# Patient Record
Sex: Female | Born: 1945 | Race: Black or African American | Hispanic: No | Smoking: Former smoker
Health system: Southern US, Community
[De-identification: ages and names within clinical notes are randomized; demographics above are authoritative.]

## PROBLEM LIST (undated history)

## (undated) DIAGNOSIS — T7840XA Allergy, unspecified, initial encounter: Secondary | ICD-10-CM

## (undated) DIAGNOSIS — K219 Gastro-esophageal reflux disease without esophagitis: Secondary | ICD-10-CM

## (undated) DIAGNOSIS — R918 Other nonspecific abnormal finding of lung field: Secondary | ICD-10-CM

## (undated) DIAGNOSIS — D869 Sarcoidosis, unspecified: Secondary | ICD-10-CM

## (undated) DIAGNOSIS — M1990 Unspecified osteoarthritis, unspecified site: Secondary | ICD-10-CM

## (undated) DIAGNOSIS — I10 Essential (primary) hypertension: Secondary | ICD-10-CM

## (undated) DIAGNOSIS — M479 Spondylosis, unspecified: Secondary | ICD-10-CM

## (undated) DIAGNOSIS — C3490 Malignant neoplasm of unspecified part of unspecified bronchus or lung: Secondary | ICD-10-CM

## (undated) DIAGNOSIS — J449 Chronic obstructive pulmonary disease, unspecified: Secondary | ICD-10-CM

## (undated) HISTORY — DX: Gastro-esophageal reflux disease without esophagitis: K21.9

## (undated) HISTORY — DX: Other nonspecific abnormal finding of lung field: R91.8

## (undated) HISTORY — DX: Essential (primary) hypertension: I10

## (undated) HISTORY — PX: ABDOMINAL HYSTERECTOMY: SHX81

## (undated) HISTORY — DX: Chronic obstructive pulmonary disease, unspecified: J44.9

## (undated) HISTORY — DX: Unspecified osteoarthritis, unspecified site: M19.90

---

## 1998-08-10 ENCOUNTER — Emergency Department (HOSPITAL_COMMUNITY): Admission: EM | Admit: 1998-08-10 | Discharge: 1998-08-10 | Payer: Self-pay | Admitting: Emergency Medicine

## 1998-08-10 ENCOUNTER — Encounter: Payer: Self-pay | Admitting: Emergency Medicine

## 2000-05-25 ENCOUNTER — Emergency Department (HOSPITAL_COMMUNITY): Admission: EM | Admit: 2000-05-25 | Discharge: 2000-05-25 | Payer: Self-pay | Admitting: Emergency Medicine

## 2000-05-26 ENCOUNTER — Emergency Department (HOSPITAL_COMMUNITY): Admission: EM | Admit: 2000-05-26 | Discharge: 2000-05-26 | Payer: Self-pay | Admitting: *Deleted

## 2000-06-05 ENCOUNTER — Emergency Department (HOSPITAL_COMMUNITY): Admission: EM | Admit: 2000-06-05 | Discharge: 2000-06-05 | Payer: Self-pay | Admitting: Emergency Medicine

## 2000-06-18 ENCOUNTER — Encounter: Admission: RE | Admit: 2000-06-18 | Discharge: 2000-07-25 | Payer: Self-pay | Admitting: Specialist

## 2001-05-14 ENCOUNTER — Ambulatory Visit (HOSPITAL_COMMUNITY): Admission: RE | Admit: 2001-05-14 | Discharge: 2001-05-14 | Payer: Self-pay | Admitting: Family Medicine

## 2001-05-14 ENCOUNTER — Encounter: Payer: Self-pay | Admitting: Family Medicine

## 2001-10-27 ENCOUNTER — Ambulatory Visit (HOSPITAL_COMMUNITY): Admission: RE | Admit: 2001-10-27 | Discharge: 2001-10-27 | Payer: Self-pay | Admitting: Family Medicine

## 2002-08-01 ENCOUNTER — Ambulatory Visit (HOSPITAL_COMMUNITY): Admission: RE | Admit: 2002-08-01 | Discharge: 2002-08-01 | Payer: Self-pay | Admitting: Family Medicine

## 2002-08-01 ENCOUNTER — Encounter: Payer: Self-pay | Admitting: Family Medicine

## 2002-11-14 ENCOUNTER — Ambulatory Visit (HOSPITAL_COMMUNITY): Admission: RE | Admit: 2002-11-14 | Discharge: 2002-11-14 | Payer: Self-pay | Admitting: Family Medicine

## 2002-11-14 ENCOUNTER — Encounter: Payer: Self-pay | Admitting: Family Medicine

## 2004-01-03 ENCOUNTER — Ambulatory Visit: Payer: Self-pay | Admitting: *Deleted

## 2004-03-16 ENCOUNTER — Ambulatory Visit: Payer: Self-pay | Admitting: Family Medicine

## 2005-01-04 ENCOUNTER — Ambulatory Visit: Payer: Self-pay | Admitting: Family Medicine

## 2005-02-01 ENCOUNTER — Ambulatory Visit: Payer: Self-pay | Admitting: Family Medicine

## 2005-11-19 ENCOUNTER — Ambulatory Visit: Payer: Self-pay | Admitting: Family Medicine

## 2005-12-12 ENCOUNTER — Ambulatory Visit (HOSPITAL_COMMUNITY): Admission: RE | Admit: 2005-12-12 | Discharge: 2005-12-12 | Payer: Self-pay | Admitting: Family Medicine

## 2006-05-31 ENCOUNTER — Ambulatory Visit: Payer: Self-pay | Admitting: Family Medicine

## 2006-06-20 ENCOUNTER — Ambulatory Visit (HOSPITAL_COMMUNITY): Admission: RE | Admit: 2006-06-20 | Discharge: 2006-06-20 | Payer: Self-pay | Admitting: Family Medicine

## 2006-12-11 ENCOUNTER — Encounter (INDEPENDENT_AMBULATORY_CARE_PROVIDER_SITE_OTHER): Payer: Self-pay | Admitting: *Deleted

## 2007-09-07 ENCOUNTER — Emergency Department (HOSPITAL_COMMUNITY): Admission: EM | Admit: 2007-09-07 | Discharge: 2007-09-08 | Payer: Self-pay | Admitting: Emergency Medicine

## 2008-02-03 ENCOUNTER — Encounter: Admission: RE | Admit: 2008-02-03 | Discharge: 2008-02-03 | Payer: Self-pay | Admitting: Nephrology

## 2008-03-26 LAB — HM MAMMOGRAPHY: HM Mammogram: NORMAL

## 2009-06-10 ENCOUNTER — Ambulatory Visit: Payer: Self-pay | Admitting: Internal Medicine

## 2009-06-10 DIAGNOSIS — M1990 Unspecified osteoarthritis, unspecified site: Secondary | ICD-10-CM | POA: Insufficient documentation

## 2009-06-10 DIAGNOSIS — I10 Essential (primary) hypertension: Secondary | ICD-10-CM | POA: Insufficient documentation

## 2009-06-10 DIAGNOSIS — J449 Chronic obstructive pulmonary disease, unspecified: Secondary | ICD-10-CM

## 2009-06-10 DIAGNOSIS — K219 Gastro-esophageal reflux disease without esophagitis: Secondary | ICD-10-CM

## 2009-06-13 ENCOUNTER — Encounter: Payer: Self-pay | Admitting: Internal Medicine

## 2009-06-21 ENCOUNTER — Ambulatory Visit: Payer: Self-pay | Admitting: Internal Medicine

## 2009-06-23 ENCOUNTER — Encounter: Payer: Self-pay | Admitting: Internal Medicine

## 2009-06-24 ENCOUNTER — Ambulatory Visit: Payer: Self-pay | Admitting: Internal Medicine

## 2009-06-24 DIAGNOSIS — R740 Nonspecific elevation of levels of transaminase and lactic acid dehydrogenase [LDH]: Secondary | ICD-10-CM

## 2009-06-24 LAB — CONVERTED CEMR LAB
ALT: 34 units/L (ref 0–35)
Alpha-2-Globulin: 8.1 % (ref 7.1–11.8)
Beta Globulin: 6.8 % (ref 4.7–7.2)
Bilirubin, Direct: 0.1 mg/dL (ref 0.0–0.3)
Gamma Globulin: 25.9 % — ABNORMAL HIGH (ref 11.1–18.8)
Hepatitis B Surface Ag: NEGATIVE
INR: 1 (ref 0.8–1.0)
Prothrombin Time: 10.6 s (ref 9.1–11.7)
Total Protein, Serum Electrophoresis: 9.3 g/dL — ABNORMAL HIGH (ref 6.0–8.3)
Total Protein: 8.8 g/dL — ABNORMAL HIGH (ref 6.0–8.3)

## 2009-06-29 ENCOUNTER — Telehealth: Payer: Self-pay | Admitting: Internal Medicine

## 2009-06-29 ENCOUNTER — Encounter: Payer: Self-pay | Admitting: Internal Medicine

## 2009-06-29 DIAGNOSIS — D472 Monoclonal gammopathy: Secondary | ICD-10-CM

## 2009-07-04 ENCOUNTER — Telehealth: Payer: Self-pay | Admitting: Internal Medicine

## 2009-07-04 ENCOUNTER — Ambulatory Visit: Payer: Self-pay | Admitting: Internal Medicine

## 2009-07-04 LAB — CONVERTED CEMR LAB
Albumin ELP: 46.5 % — ABNORMAL LOW (ref 55.8–66.1)
Total Protein, Serum Electrophoresis: 9 g/dL — ABNORMAL HIGH (ref 6.0–8.3)

## 2009-07-29 ENCOUNTER — Ambulatory Visit: Payer: Self-pay | Admitting: Internal Medicine

## 2009-08-09 ENCOUNTER — Encounter (INDEPENDENT_AMBULATORY_CARE_PROVIDER_SITE_OTHER): Payer: Self-pay | Admitting: *Deleted

## 2009-09-02 ENCOUNTER — Ambulatory Visit: Payer: Self-pay | Admitting: Internal Medicine

## 2009-10-14 ENCOUNTER — Ambulatory Visit: Payer: Self-pay | Admitting: Internal Medicine

## 2009-10-18 ENCOUNTER — Telehealth (INDEPENDENT_AMBULATORY_CARE_PROVIDER_SITE_OTHER): Payer: Self-pay | Admitting: *Deleted

## 2009-11-16 ENCOUNTER — Telehealth: Payer: Self-pay | Admitting: Internal Medicine

## 2009-12-16 ENCOUNTER — Telehealth: Payer: Self-pay | Admitting: Internal Medicine

## 2009-12-23 ENCOUNTER — Ambulatory Visit: Payer: Self-pay | Admitting: Internal Medicine

## 2009-12-29 ENCOUNTER — Telehealth: Payer: Self-pay | Admitting: Internal Medicine

## 2010-01-27 ENCOUNTER — Ambulatory Visit: Payer: Self-pay | Admitting: Internal Medicine

## 2010-03-05 ENCOUNTER — Emergency Department (HOSPITAL_COMMUNITY)
Admission: EM | Admit: 2010-03-05 | Discharge: 2010-03-05 | Payer: Self-pay | Source: Home / Self Care | Admitting: Emergency Medicine

## 2010-03-08 ENCOUNTER — Ambulatory Visit: Payer: Self-pay | Admitting: Internal Medicine

## 2010-03-08 ENCOUNTER — Encounter: Payer: Self-pay | Admitting: Internal Medicine

## 2010-03-08 DIAGNOSIS — M545 Low back pain: Secondary | ICD-10-CM

## 2010-03-21 ENCOUNTER — Ambulatory Visit: Payer: Self-pay | Admitting: Internal Medicine

## 2010-04-13 ENCOUNTER — Telehealth: Payer: Self-pay | Admitting: Internal Medicine

## 2010-04-14 ENCOUNTER — Ambulatory Visit
Admission: RE | Admit: 2010-04-14 | Discharge: 2010-04-14 | Payer: Self-pay | Source: Home / Self Care | Attending: Internal Medicine | Admitting: Internal Medicine

## 2010-04-14 DIAGNOSIS — M545 Low back pain: Secondary | ICD-10-CM

## 2010-04-14 DIAGNOSIS — G8929 Other chronic pain: Secondary | ICD-10-CM | POA: Insufficient documentation

## 2010-04-14 DIAGNOSIS — J3089 Other allergic rhinitis: Secondary | ICD-10-CM | POA: Insufficient documentation

## 2010-04-19 ENCOUNTER — Telehealth: Payer: Self-pay | Admitting: Internal Medicine

## 2010-04-19 ENCOUNTER — Encounter: Payer: Self-pay | Admitting: Internal Medicine

## 2010-04-19 ENCOUNTER — Ambulatory Visit (HOSPITAL_COMMUNITY)
Admission: RE | Admit: 2010-04-19 | Discharge: 2010-04-19 | Payer: Self-pay | Source: Home / Self Care | Attending: Internal Medicine | Admitting: Internal Medicine

## 2010-04-19 DIAGNOSIS — R109 Unspecified abdominal pain: Secondary | ICD-10-CM | POA: Insufficient documentation

## 2010-04-21 ENCOUNTER — Ambulatory Visit: Admit: 2010-04-21 | Payer: Self-pay | Admitting: Internal Medicine

## 2010-04-21 ENCOUNTER — Other Ambulatory Visit: Payer: Self-pay | Admitting: Internal Medicine

## 2010-04-21 DIAGNOSIS — R102 Pelvic and perineal pain: Secondary | ICD-10-CM

## 2010-04-23 LAB — CONVERTED CEMR LAB
ALT: 42 units/L — ABNORMAL HIGH (ref 0–35)
AST: 39 units/L — ABNORMAL HIGH (ref 0–37)
Albumin: 3.7 g/dL (ref 3.5–5.2)
BUN: 8 mg/dL (ref 6–23)
Basophils Absolute: 0.1 10*3/uL (ref 0.0–0.1)
CO2: 29 meq/L (ref 19–32)
Chloride: 105 meq/L (ref 96–112)
Direct LDL: 144.9 mg/dL
Eosinophils Relative: 15.5 % — ABNORMAL HIGH (ref 0.0–5.0)
Glucose, Bld: 94 mg/dL (ref 70–99)
HCT: 39 % (ref 36.0–46.0)
Hemoglobin: 12.7 g/dL (ref 12.0–15.0)
Lymphs Abs: 0.9 10*3/uL (ref 0.7–4.0)
MCV: 92.1 fL (ref 78.0–100.0)
Monocytes Absolute: 0.7 10*3/uL (ref 0.1–1.0)
Monocytes Relative: 11.9 % (ref 3.0–12.0)
Neutro Abs: 2.9 10*3/uL (ref 1.4–7.7)
Platelets: 233 10*3/uL (ref 150.0–400.0)
Potassium: 3.7 meq/L (ref 3.5–5.1)
RDW: 13.2 % (ref 11.5–14.6)
Sodium: 144 meq/L (ref 135–145)
TSH: 1.12 microintl units/mL (ref 0.35–5.50)
Total Bilirubin: 0.6 mg/dL (ref 0.3–1.2)

## 2010-04-25 NOTE — Assessment & Plan Note (Signed)
Summary: 2 WK ROV /NWS #   Vital Signs:  Patient profile:   65 year old female Height:      62 inches Weight:      155 pounds BMI:     28.45 O2 Sat:      96 % on Room air Temp:     99.0 degrees F oral Pulse rate:   100 / minute Pulse rhythm:   regular Resp:     16 per minute BP sitting:   130 / 88  (left arm) Cuff size:   regular  Vitals Entered By: Rock Nephew CMA (June 24, 2009 10:02 AM)  Nutrition Counseling: Patient's BMI is greater than 25 and therefore counseled on weight management options.  O2 Flow:  Room air CC: follow-up visit// discuss lab test results, Hypertension Management Is Patient Diabetic? Yes Did you bring your meter with you today? No   Primary Care Provider:  Etta Grandchild MD  CC:  follow-up visit// discuss lab test results and Hypertension Management.  History of Present Illness: She returns for f/up  and she states that her breathing is much better. Her PFT's were c/w COPD with a good response to bronchodilator. She reports that her endurance is much better.  On her recent labs she had elevated lft's, total protein, and alk phos.  She complains of a recurrence of right shoulder pain. Her previous PCP did an xray and told her that it was arthritis.  Hypertension History:      She denies headache, chest pain, palpitations, dyspnea with exertion, orthopnea, PND, peripheral edema, visual symptoms, neurologic problems, syncope, and side effects from treatment.  She notes no problems with any antihypertensive medication side effects.        Positive major cardiovascular risk factors include female age 87 years old or older and hypertension.  Negative major cardiovascular risk factors include no history of diabetes or hyperlipidemia, negative family history for ischemic heart disease, and non-tobacco-user status.        Further assessment for target organ damage reveals no history of ASHD, cardiac end-organ damage (CHF/LVH), stroke/TIA, peripheral  vascular disease, renal insufficiency, or hypertensive retinopathy.     Preventive Screening-Counseling & Management  Alcohol-Tobacco     Alcohol drinks/day: 1     Alcohol type: beer     >5/day in last 3 mos: no     Alcohol Counseling: not indicated; use of alcohol is not excessive or problematic     Feels need to cut down: no     Feels annoyed by complaints: no     Feels guilty re: drinking: no     Needs 'eye opener' in am: no     Smoking Status: quit     Smoking Cessation Counseling: no     Smoke Cessation Stage: quit     Packs/Day: 2.0     Year Started: 1965     Year Quit: 2001     Pack years: 64     Tobacco Counseling: to remain off tobacco products  Hep-HIV-STD-Contraception     Hepatitis Risk: no risk noted     HIV Risk: no risk noted     STD Risk: no risk noted  Safety-Violence-Falls     Seat Belt Use: yes     Helmet Use: yes     Firearms in the Home: no firearms in the home     Smoke Detectors: yes     Violence in the Home: no risk noted  Sexual Abuse: no      Sexual History:  currently monogamous.        Drug Use:  no.        Blood Transfusions:  no.    Clinical Review Panels:  Lipid Management   Cholesterol:  205 (06/10/2009)   HDL (good cholesterol):  42.70 (06/10/2009)  Diabetes Management   Creatinine:  0.9 (06/10/2009)   Last Pneumovax:  Pneumovax (06/10/2009)  CBC   WBC:  5.5 (06/10/2009)   RBC:  4.23 (06/10/2009)   Hgb:  12.7 (06/10/2009)   Hct:  39.0 (06/10/2009)   Platelets:  233.0 (06/10/2009)   MCV  92.1 (06/10/2009)   MCHC  32.6 (06/10/2009)   RDW  13.2 (06/10/2009)   PMN:  55.0 A Manual Differential was performed and is consistent with the Automated Differential. % (06/10/2009)   Lymphs:  16.5 (06/10/2009)   Monos:  11.9 (06/10/2009)   Eosinophils:  15.5 H % (06/10/2009)   Basophil:  1.1 (06/10/2009)  Complete Metabolic Panel   Glucose:  94 (06/10/2009)   Sodium:  144 (06/10/2009)   Potassium:  3.7 (06/10/2009)    Chloride:  105 (06/10/2009)   CO2:  29 (06/10/2009)   BUN:  8 (06/10/2009)   Creatinine:  0.9 (06/10/2009)   Albumin:  3.7 (06/10/2009)   Total Protein:  9.0 (06/10/2009)   Calcium:  9.2 (06/10/2009)   Total Bili:  0.6 (06/10/2009)   Alk Phos:  177 (06/10/2009)   SGPT (ALT):  42 (06/10/2009)   SGOT (AST):  39 (06/10/2009)   Medications Prior to Update: 1)  Ipratropium-Albuterol 0.5-2.5 (3) Mg/63ml Soln (Ipratropium-Albuterol) .Marland Kitchen.. 1 Vial Per Nebulizer Treatment As Directed 2)  Prilosec 20 Mg Cpdr (Omeprazole) .Marland Kitchen.. 1 Tab Once Daily 3)  Metoprolol Succinate 25 Mg Tab (Metoprolol Succinate) .Marland Kitchen.. 1 Tab Two Times A Day 4)  Sulindac 200 Mg Tabs (Sulindac) .Marland Kitchen.. 1 Tab Two Times A Day 5)  Losartan Potassium-Hctz 50-12.5 Mg Tabs (Losartan Potassium-Hctz) .... Once Daily 6)  Ventolin Hfa 108 (90 Base) Mcg/act Aers (Albuterol Sulfate) .... 2 Puffs As Needed 7)  Brovana 15 Mcg/75ml Nebu (Arformoterol Tartrate) .... One in [place] Two Times A Day For Lung Disease  Current Medications (verified): 1)  Ipratropium-Albuterol 0.5-2.5 (3) Mg/51ml Soln (Ipratropium-Albuterol) .Marland Kitchen.. 1 Vial Per Nebulizer Treatment As Directed 2)  Prilosec 20 Mg Cpdr (Omeprazole) .Marland Kitchen.. 1 Tab Once Daily 3)  Metoprolol Succinate 25 Mg Tab (Metoprolol Succinate) .Marland Kitchen.. 1 Tab Two Times A Day 4)  Sulindac 200 Mg Tabs (Sulindac) .Marland Kitchen.. 1 Tab Two Times A Day 5)  Losartan Potassium-Hctz 50-12.5 Mg Tabs (Losartan Potassium-Hctz) .... Once Daily 6)  Ventolin Hfa 108 (90 Base) Mcg/act Aers (Albuterol Sulfate) .... 2 Puffs As Needed 7)  Brovana 15 Mcg/48ml Nebu (Arformoterol Tartrate) .... One in [place] Two Times A Day For Lung Disease 8)  Tramadol Hcl 50 Mg Tabs (Tramadol Hcl) .Marland Kitchen.. 1-2 By Mouth Qid As Needed For Pain  Allergies (verified): 1)  ! Ibuprofen  Past History:  Past Medical History: Reviewed history from 06/10/2009 and no changes required. COPD GERD Hypertension Osteoarthritis  Past Surgical History: Reviewed history from  06/10/2009 and no changes required. Hysterectomy  Family History: Reviewed history from 06/10/2009 and no changes required. Family History Hypertension  Social History: Reviewed history from 06/10/2009 and no changes required. Occupation: school bus Acupuncturist Alcohol use-yes Drug use-no Regular exercise-no  Review of Systems  The patient denies anorexia, fever, weight loss, weight gain, chest pain, peripheral edema, prolonged cough, headaches, hemoptysis, abdominal  pain, hematuria, suspicious skin lesions, enlarged lymph nodes, and angioedema.   Resp:  Denies chest pain with inspiration, cough, coughing up blood, morning headaches, pleuritic, shortness of breath, sputum productive, and wheezing. GI:  Denies abdominal pain, bloody stools, change in bowel habits, diarrhea, indigestion, loss of appetite, nausea, vomiting, and yellowish skin color. MS:  Complains of joint pain and stiffness; denies joint redness, joint swelling, loss of strength, low back pain, mid back pain, muscle aches, and thoracic pain.  Physical Exam  General:  alert, well-nourished, well-hydrated, appropriate dress, normal appearance, healthy-appearing, cooperative to examination, good hygiene, and underweight appearing.   Head:  normocephalic, atraumatic, no abnormalities observed, and no abnormalities palpated.   Eyes:  No icterus, pink moist mm., Mouth:  Oral mucosa and oropharynx without lesions or exudates.  Teeth in good repair. Neck:  No deformities, masses, or tenderness noted. Lungs:  Normal respiratory effort, chest expands symmetrically. Lungs are clear to auscultation, no crackles or wheezes. Heart:  Normal rate and regular rhythm. S1 and S2 normal without gallop, murmur, click, rub or other extra sounds. Abdomen:  soft, non-tender, normal bowel sounds, no distention, no masses, no guarding, no rigidity, no rebound tenderness, no hepatomegaly, and no splenomegaly.   Msk:  normal ROM, no joint  tenderness, no joint swelling, no joint warmth, no redness over joints, no joint deformities, no joint instability, no crepitation, and no muscle atrophy.   Pulses:  R and L carotid,radial,femoral,dorsalis pedis and posterior tibial pulses are full and equal bilaterally Extremities:  No clubbing, cyanosis, edema, or deformity noted with normal full range of motion of all joints.   Neurologic:  No cranial nerve deficits noted. Station and gait are normal. Plantar reflexes are down-going bilaterally. DTRs are symmetrical throughout. Sensory, motor and coordinative functions appear intact. Skin:  Intact without suspicious lesions or rashes Cervical Nodes:  no anterior cervical adenopathy and no posterior cervical adenopathy.   Axillary Nodes:  no R axillary adenopathy and no L axillary adenopathy.   Inguinal Nodes:  no R inguinal adenopathy and no L inguinal adenopathy.   Psych:  Cognition and judgment appear intact. Alert and cooperative with normal attention span and concentration. No apparent delusions, illusions, hallucinations   Impression & Recommendations:  Problem # 1:  TRANSAMINASES, SERUM, ELEVATED (ICD-790.4) Assessment New this looks like fatty liver disease but I will check for viral hepatitis, paraproteinemia, and mass with u/s. Orders: Venipuncture ([address]) TLB-Hepatic/Liver Function Pnl (80076-HEPATIC) T-Hepatitis C Antibody (60454-09811) T-Hepatitis B Surface Antibody (91478-29562) T-Hepatitis B Surface Antigen (13086-57846) T-Hepatitis Acute Panel (96295-28413) T-Serum Protein Electrophoresis (24401-02725) TLB-PT (Protime) (85610-PTP) Radiology Referral (Radiology)  Problem # 2:  COPD (ICD-496) Assessment: Improved she has responded very well to brovana. Her updated medication list for this problem includes:    Ipratropium-albuterol 0.5-2.5 (3) Mg/65ml Soln (Ipratropium-albuterol) .Marland Kitchen... 1 vial per nebulizer treatment as directed    Ventolin Hfa 108 (90 Base) Mcg/act Aers  (Albuterol sulfate) .Marland Kitchen... 2 puffs as needed    Brovana 15 Mcg/28ml Nebu (Arformoterol tartrate) ..... One in neb two times a day for lung disease  Problem # 3:  HYPERTENSION (ICD-401.9) Assessment: Unchanged  Her updated medication list for this problem includes:    Losartan Potassium-hctz 50-12.5 Mg Tabs (Losartan potassium-hctz) ..... Once daily  BP today: 130/88 Prior BP: 126/82 (06/10/2009)  Prior 10 Yr Risk Heart Disease: Not enough information (06/10/2009)  Labs Reviewed: K+: 3.7 (06/10/2009) Creat: : 0.9 (06/10/2009)   Chol: 205 (06/10/2009)   HDL: 42.70 (06/10/2009)   TG: 178.0 (  06/10/2009)  Problem # 4:  OSTEOARTHRITIS (ICD-715.90) Assessment: Deteriorated  Her updated medication list for this problem includes:    Sulindac 200 Mg Tabs (Sulindac) .Marland Kitchen... 1 tab two times a day    Tramadol Hcl 50 Mg Tabs (Tramadol hcl) .Marland Kitchen... 1-2 by mouth qid as needed for pain  Complete Medication List: 1)  Ipratropium-albuterol 0.5-2.5 (3) Mg/25ml Soln (Ipratropium-albuterol) .Marland Kitchen.. 1 vial per nebulizer treatment as directed 2)  Prilosec 20 Mg Cpdr (Omeprazole) .Marland Kitchen.. 1 tab once daily 3)  Metoprolol Succinate 25 Mg Tab (metoprolol Succinate)  .Marland Kitchen.. 1 tab two times a day 4)  Sulindac 200 Mg Tabs (Sulindac) .Marland Kitchen.. 1 tab two times a day 5)  Losartan Potassium-hctz 50-12.5 Mg Tabs (Losartan potassium-hctz) .... Once daily 6)  Ventolin Hfa 108 (90 Base) Mcg/act Aers (Albuterol sulfate) .... 2 puffs as needed 7)  Brovana 15 Mcg/85ml Nebu (Arformoterol tartrate) .... One in neb two times a day for lung disease 8)  Tramadol Hcl 50 Mg Tabs (Tramadol hcl) .Marland Kitchen.. 1-2 by mouth qid as needed for pain  Hypertension Assessment/Plan:      The patient's hypertensive risk group is category B: At least one risk factor (excluding diabetes) with no target organ damage.  Today's blood pressure is 130/88.  Her blood pressure goal is < 140/90.  Patient Instructions: 1)  Please schedule a follow-up appointment in 1 month. 2)   It is important that you exercise regularly at least 20 minutes 5 times a week. If you develop chest pain, have severe difficulty breathing, or feel very tired , stop exercising immediately and seek medical attention. 3)  You need to lose weight. Consider a lower calorie diet and regular exercise.  4)  Check your Blood Pressure regularly. If it is above 140/90: you should make an appointment. Prescriptions: TRAMADOL HCL 50 MG TABS (TRAMADOL HCL) 1-2 by mouth QID as needed for pain  #100 x 11   Entered and Authorized by:   Etta Grandchild MD   Signed by:   Etta Grandchild MD on 06/24/2009   Method used:   Print then Give to Patient   RxID:   [id number] TRAMADOL HCL 50 MG TABS (TRAMADOL HCL) 1-2 by mouth QID as needed for pain  #100 x 11   Entered and Authorized by:   Etta Grandchild MD   Signed by:   Etta Grandchild MD on 06/24/2009   Method used:   Print then Give to Patient   RxID:   [id number]   Preventive Care Screening  Mammogram:    Date:  03/26/2006    Results:  normal   Pap Smear:    Date:  03/26/2006    Results:  normal     Not Administered:    Influenza Vaccine not given due to: declined

## 2010-04-25 NOTE — Letter (Signed)
Summary: Lipid Letter  [facility]  [address] [place], [place] [address]   Phone: [phone]  Fax: [phone]    06/13/2009  Ariza Evans [address] [place], [place]  [address]  Dear Ms. Inthavong:  We have carefully reviewed your last lipid profile from  and the results are noted below with a summary of recommendations for lipid management.    Cholesterol:       205     Goal: <200   HDL "good" Cholesterol:   57.84     Goal: >40   LDL "bad" Cholesterol:   145     Goal: <130   Triglycerides:       178.0     Goal: <150    you need to do some work on this    TLC Diet (Therapeutic Lifestyle Change): Saturated Fats & Transfatty acids should be kept < 7% of total calories ***Reduce Saturated Fats Polyunstaurated Fat can be up to 10% of total calories Monounsaturated Fat Fat can be up to 20% of total calories Total Fat should be no greater than 25-35% of total calories Carbohydrates should be 50-60% of total calories Protein should be approximately 15% of total calories Fiber should be at least 20-30 grams a day ***Increased fiber may help lower LDL Total Cholesterol should be < 200mg /day Consider adding plant stanol/sterols to diet (example: Benacol spread) ***A higher intake of unsaturated fat may reduce Triglycerides and Increase HDL    Adjunctive Measures (may lower LIPIDS and reduce risk of Heart Attack) include: Aerobic Exercise (20-30 minutes 3-4 times a week) Limit Alcohol Consumption Weight Reduction Aspirin 75-81 mg a day by mouth (if not allergic or contraindicated) Dietary Fiber 20-30 grams a day by mouth     Current Medications: 1)    Ipratropium-albuterol 0.5-2.5 (3) Mg/11ml Soln (Ipratropium-albuterol) .Marland Kitchen.. 1 vial per nebulizer treatment as directed 2)    Prilosec 20 Mg Cpdr (Omeprazole) .Marland Kitchen.. 1 tab once daily 3)    Metoprolol Succinate 25 Mg Tab (metoprolol Succinate)  .Marland Kitchen.. 1 tab two times a day 4)    Sulindac 200 Mg Tabs (Sulindac) .Marland Kitchen..  1 tab two times a day 5)    Losartan Potassium-hctz 50-12.5 Mg Tabs (Losartan potassium-hctz) .... Once daily 6)    Ventolin Hfa 108 (90 Base) Mcg/act Aers (Albuterol sulfate) .... 2 puffs as needed 7)    Brovana 15 Mcg/61ml Nebu (Arformoterol tartrate) .... One in neb two times a day for lung disease  If you have any questions, please call. We appreciate being able to work with you.   Sincerely,    [facility] Etta Grandchild MD

## 2010-04-25 NOTE — Progress Notes (Signed)
  Phone Note Other Incoming   Caller: walk in from pt Summary of Call: pt came in to get samples of Brovana 15 micrograms. I gave her 2 boxes of the Brovana 15 micrograms samples.  Initial call taken by: Ami Bullins CMA,  July 04, 2009 9:46 AM    Prescriptions: BROVANA 15 MCG/2ML NEBU (ARFORMOTEROL TARTRATE) One in neb two times a day for lung disease  #48 x 0   Entered by:   Ami Bullins CMA   Authorized by:   Etta Grandchild MD   Signed by:   Bill Salinas CMA on 07/04/2009   Method used:   Samples Given   RxID:   [id number]

## 2010-04-25 NOTE — Assessment & Plan Note (Signed)
Summary: 1 MTH FU--STC   Vital Signs:  Patient profile:   65 year old female Height:      62 inches Weight:      152.25 pounds BMI:     27.95 O2 Sat:      97 % on Room air Temp:     98.3 degrees F oral Pulse rate:   106 / minute Pulse rhythm:   regular Resp:     16 per minute BP sitting:   122 / 78  (right arm) Cuff size:   large  Vitals Entered By: Rock Nephew CMA (October 14, 2009 9:52 AM)  Nutrition Counseling: Patient's BMI is greater than 25 and therefore counseled on weight management options.  O2 Flow:  Room air  Primary Care Provider:  Etta Grandchild MD  CC:  COPD follow-up.  History of Present Illness:  COPD Follow-Up      This is a 65 year old woman who presents for COPD follow-up.  The patient reports heat intolerance, but denies shortness of breath, chest tightness, wheezing, cough, increased sputum, nocturnal awakening, and exercise induced symptoms.  Symptoms occur weekly.  The patient reports no limitation in activities.  Current treatment includes MDI with spacer.  Symptoms are worse with URIs.    Hypertension Follow-Up      The patient also presents for Hypertension follow-up.  The patient denies lightheadedness, urinary frequency, headaches, edema, rash, and fatigue.  The patient denies the following associated symptoms: chest pain, chest pressure, exercise intolerance, dyspnea, palpitations, syncope, leg edema, and pedal edema.  Compliance with medications (by patient report) has been near 100%.  The patient reports that dietary compliance has been good.  The patient reports exercising occasionally.  Adjunctive measures currently used by the patient include salt restriction and relaxation.    Preventive Screening-Counseling & Management  Alcohol-Tobacco     Alcohol drinks/day: 1     Alcohol type: beer     >5/day in last 3 mos: no     Alcohol Counseling: not indicated; use of alcohol is not excessive or problematic     Feels need to cut down: no     Feels  annoyed by complaints: no     Feels guilty re: drinking: no     Needs 'eye opener' in am: no     Smoking Status: quit     Smoking Cessation Counseling: no     Smoke Cessation Stage: quit     Packs/Day: 2.0     Year Started: 1964     Year Quit: 2001     Pack years: 18     Tobacco Counseling: to remain off tobacco products  Hep-HIV-STD-Contraception     Hepatitis Risk: no risk noted     HIV Risk: no risk noted     STD Risk: no risk noted      Sexual History:  currently monogamous.        Drug Use:  no.        Blood Transfusions:  no.    Medications Prior to Update: 1)  Ipratropium-Albuterol 0.5-2.5 (3) Mg/63ml Soln (Ipratropium-Albuterol) .Marland Kitchen.. 1 Vial Per Nebulizer Treatment As Directed 2)  Prilosec 20 Mg Cpdr (Omeprazole) .Marland Kitchen.. 1 Tab Once Daily 3)  Metoprolol Succinate 25 Mg Tab (Metoprolol Succinate) .Marland Kitchen.. 1 Tab Two Times A Day 4)  Sulindac 200 Mg Tabs (Sulindac) .Marland Kitchen.. 1 Tab Two Times A Day 5)  Ventolin Hfa 108 (90 Base) Mcg/act Aers (Albuterol Sulfate) .... 2 Puffs As Needed 6)  Tramadol Hcl 50 Mg Tabs (Tramadol Hcl) .Marland Kitchen.. 1-2 By Mouth Qid As Needed For Pain 7)  Tekturna Hct 150-25 Mg Tabs (Aliskiren-Hydrochlorothiazide) .... One By Mouth Each Day For High Blood Pressure 8)  Spiriva Handihaler 18 Mcg Caps (Tiotropium Bromide Monohydrate) .... One Puff Once Daily Into The Lungs  Current Medications (verified): 1)  Prilosec 20 Mg Cpdr (Omeprazole) .Marland Kitchen.. 1 Tab Once Daily 2)  Metoprolol Succinate 25 Mg Tab (Metoprolol Succinate) .Marland Kitchen.. 1 Tab Two Times A Day 3)  Sulindac 200 Mg Tabs (Sulindac) .Marland Kitchen.. 1 Tab Two Times A Day 4)  Tramadol Hcl 50 Mg Tabs (Tramadol Hcl) .Marland Kitchen.. 1-2 By Mouth Qid As Needed For Pain 5)  Tekturna Hct 150-25 Mg Tabs (Aliskiren-Hydrochlorothiazide) .... One By Mouth Each Day For High Blood Pressure 6)  Spiriva Handihaler 18 Mcg Caps (Tiotropium Bromide Monohydrate) .... One Puff Once Daily Into The Lungs  Allergies (verified): 1)  ! Ibuprofen  Past History:  Past  Medical History: Last updated: 06/10/2009 COPD GERD Hypertension Osteoarthritis  Past Surgical History: Last updated: 06/10/2009 Hysterectomy  Family History: Last updated: 06/10/2009 Family History Hypertension  Social History: Last updated: 06/10/2009 Occupation: school bus driver [employer] Alcohol use-yes Drug use-no Regular exercise-no  Risk Factors: Alcohol Use: 1 (10/14/2009) >5 drinks/d w/in last 3 months: no (10/14/2009) Exercise: no (06/10/2009)  Risk Factors: Smoking Status: quit (10/14/2009) Packs/Day: 2.0 (10/14/2009)  Family History: Reviewed history from 06/10/2009 and no changes required. Family History Hypertension  Social History: Reviewed history from 06/10/2009 and no changes required. Occupation: school bus Acupuncturist Alcohol use-yes Drug use-no Regular exercise-no  Review of Systems  The patient denies anorexia, fever, chest pain, prolonged cough, hemoptysis, abdominal pain, suspicious skin lesions, enlarged lymph nodes, and angioedema.    Physical Exam  General:  alert, well-nourished, well-hydrated, appropriate dress, normal appearance, healthy-appearing, cooperative to examination, good hygiene, and underweight appearing.   Mouth:  Oral mucosa and oropharynx without lesions or exudates.  Teeth in good repair. Neck:  No deformities, masses, or tenderness noted. Lungs:  Normal respiratory effort, chest expands symmetrically. Lungs are clear to auscultation, no crackles or wheezes. Heart:  Normal rate and regular rhythm. S1 and S2 normal without gallop, murmur, click, rub or other extra sounds. Abdomen:  soft, non-tender, normal bowel sounds, no distention, no masses, no guarding, no rigidity, no rebound tenderness, no hepatomegaly, and no splenomegaly.   Msk:  normal ROM, no joint tenderness, no joint swelling, no joint warmth, no redness over joints, no joint deformities, no joint instability, no crepitation, and no muscle  atrophy.   Pulses:  R and L carotid,radial,femoral,dorsalis pedis and posterior tibial pulses are full and equal bilaterally Extremities:  No clubbing, cyanosis, edema, or deformity noted with normal full range of motion of all joints.   Neurologic:  No cranial nerve deficits noted. Station and gait are normal. Plantar reflexes are down-going bilaterally. DTRs are symmetrical throughout. Sensory, motor and coordinative functions appear intact. Skin:  Intact without suspicious lesions or rashes Cervical Nodes:  no anterior cervical adenopathy and no posterior cervical adenopathy.   Psych:  Cognition and judgment appear intact. Alert and cooperative with normal attention span and concentration. No apparent delusions, illusions, hallucinations   Impression & Recommendations:  Problem # 1:  COPD (ICD-496) Assessment Improved  The following medications were removed from the medication list:    Ipratropium-albuterol 0.5-2.5 (3) Mg/45ml Soln (Ipratropium-albuterol) .Marland Kitchen... 1 vial per nebulizer treatment as directed    Ventolin Hfa 108 (90 Base) Mcg/act Aers (Albuterol sulfate) .Marland KitchenMarland KitchenMarland KitchenMarland Kitchen  2 puffs as needed Her updated medication list for this problem includes:    Spiriva Handihaler 18 Mcg Caps (Tiotropium bromide monohydrate) ..... One puff once daily into the lungs  Problem # 2:  HYPERTENSION (ICD-401.9) Assessment: Improved  Her updated medication list for this problem includes:    Tekturna Hct 150-25 Mg Tabs (Aliskiren-hydrochlorothiazide) ..... One by mouth each day for high blood pressure  BP today: 122/78 Prior BP: 108/80 (09/02/2009)  Prior 10 Yr Risk Heart Disease: Not enough information (06/10/2009)  Labs Reviewed: K+: 3.7 (06/10/2009) Creat: : 0.9 (06/10/2009)   Chol: 205 (06/10/2009)   HDL: 42.70 (06/10/2009)   TG: 178.0 (06/10/2009)  Complete Medication List: 1)  Prilosec 20 Mg Cpdr (Omeprazole) .Marland Kitchen.. 1 tab once daily 2)  Metoprolol Succinate 25 Mg Tab (metoprolol Succinate)  .Marland Kitchen.. 1  tab two times a day 3)  Sulindac 200 Mg Tabs (Sulindac) .Marland Kitchen.. 1 tab two times a day 4)  Tramadol Hcl 50 Mg Tabs (Tramadol hcl) .Marland Kitchen.. 1-2 by mouth qid as needed for pain 5)  Tekturna Hct 150-25 Mg Tabs (Aliskiren-hydrochlorothiazide) .... One by mouth each day for high blood pressure 6)  Spiriva Handihaler 18 Mcg Caps (Tiotropium bromide monohydrate) .... One puff once daily into the lungs  Patient Instructions: 1)  Please schedule a follow-up appointment in 6 months. 2)  Check your Blood Pressure regularly. If it is above 130/80: you should make an appointment. Prescriptions: SPIRIVA HANDIHALER 18 MCG CAPS (TIOTROPIUM BROMIDE MONOHYDRATE) One puff once daily into the lungs  #30 caps x 11   Entered and Authorized by:   Etta Grandchild MD   Signed by:   Etta Grandchild MD on 10/14/2009   Method used:   Print then Give to Patient   RxID:   [id number] TEKTURNA HCT 150-25 MG TABS (ALISKIREN-HYDROCHLOROTHIAZIDE) One by mouth each day for high blood pressure  #56 x 0   Entered and Authorized by:   Etta Grandchild MD   Signed by:   Etta Grandchild MD on 10/14/2009   Method used:   Samples Given   RxID:   [id number]   Preventive Care Screening  Mammogram:    Date:  03/26/2008    Results:  normal     Not Administered:    Influenza Vaccine not given due to: vaccine availability

## 2010-04-25 NOTE — Progress Notes (Signed)
Summary: addtional order  Phone Note Outgoing Call Call back at [facility] Phone [phone]   Summary of Call: please ask her to go back to the lab for additional testing Initial call taken by: Etta Grandchild MD,  June 29, 2009 8:03 AM  Follow-up for Phone Call        Called LMOVM for pt to return call. 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

## 2010-04-25 NOTE — Progress Notes (Signed)
Summary: rx request  Phone Note Call from Patient Call back at [phone]   Summary of Call: Patient left message on triage c/o sinus issues--drainage and coughs all the time. Please advise.  Initial call taken by: Lucious Groves CMA,  November 16, 2009 1:23 PM  Follow-up for Phone Call        pls call this in Follow-up by: Etta Grandchild MD,  November 16, 2009 1:53 PM  Additional Follow-up for Phone Call Additional follow up Details #1::        Patient notified. Additional Follow-up by: Lucious Groves CMA,  November 16, 2009 2:03 PM    New/Updated Medications: Sandria Senter ER 8-10 MG/5ML LQCR (CHLORPHENIRAMINE-HYDROCODONE) 2.5 - 5 ml by mouth two times a day as needed for cough Prescriptions: TUSSIONEX PENNKINETIC ER 8-10 MG/5ML LQCR (CHLORPHENIRAMINE-HYDROCODONE) 2.5 - 5 ml by mouth two times a day as needed for cough  #4 ounces x 1   Entered by:   Lucious Groves CMA   Authorized by:   Etta Grandchild MD   Signed by:   Lucious Groves CMA on 11/16/2009   Method used:   Telephoned to ...       [facility] (retail)       [address]       [place], [place]  [address]       Ph: [phone]       Fax: [phone]   RxID:   [id number] Sandria Senter ER 8-10 MG/5ML LQCR (CHLORPHENIRAMINE-HYDROCODONE) 2.5 - 5 ml by mouth two times a day as needed for cough  #4 ounces x 1   Entered and Authorized by:   Etta Grandchild MD   Signed by:   Etta Grandchild MD on 11/16/2009   Method used:   Historical   RxID:   [id number]

## 2010-04-25 NOTE — Letter (Signed)
Summary: Results Follow-up Letter  [facility]  [address] [place], [place] [address]   Phone: [phone]  Fax: [phone]    06/29/2009  [address] [place], [place]  [address]  Dear Ms. Gaza,   The following are the results of your recent test(s):  Test       Result     Viral helatitis panel   normal Proteins in the blood   abnormal   _________________________________________________________  Please call for an appointment soon _________________________________________________________ _________________________________________________________ _________________________________________________________  Sincerely,  Sanda Linger MD  [facility]

## 2010-04-25 NOTE — Miscellaneous (Signed)
Summary: Health visitor   Imported By: Lester [place] 06/17/2009 10:25:01  _____________________________________________________________________  External Attachment:    Type:   Image     Comment:   External Document

## 2010-04-25 NOTE — Assessment & Plan Note (Signed)
Summary: 1 mos f/u / # / cd   Vital Signs:  Patient profile:   65 year old female Height:      62 inches Weight:      159 pounds O2 Sat:      98 % on Room air Temp:     99.4 degrees F oral Pulse rate:   95 / minute Pulse rhythm:   regular Resp:     16 per minute BP sitting:   144 / 98  (right arm) Cuff size:   large  Vitals Entered By: Rock Nephew CMA (Jul 29, 2009 9:43 AM)  O2 Flow:  Room air CC: dry cough x several mos, Cough, Hypertension Management Pain Assessment Patient in pain? no        Primary Care Provider:  Etta Grandchild MD  CC:  dry cough x several mos, Cough, and Hypertension Management.  History of Present Illness:  Cough      This is a 65 year old woman who presents with Cough.  The symptoms began 3 months ago.  The intensity is described as mild.  The patient reports non-productive cough, but denies productive cough, pleuritic chest pain, shortness of breath, wheezing, exertional dyspnea, fever, hemoptysis, and malaise.  Associated symtpoms include nasal congestion and chronic rhinitis.  The patient denies the following symptoms: cold/URI symptoms, sore throat, weight loss, acid reflux symptoms, and peripheral edema.  The cough is worse with cold exposure and smoking.  Risk factors include history of asthma and history of reflux.  Diagnostic testing to date has included spirometry.    Hypertension History:      She complains of side effects from treatment, but denies headache, chest pain, palpitations, dyspnea with exertion, orthopnea, PND, peripheral edema, visual symptoms, neurologic problems, and syncope.  She notes the following problems with antihypertensive medication side effects: Cough.        Positive major cardiovascular risk factors include female age 64 years old or older and hypertension.  Negative major cardiovascular risk factors include no history of diabetes or hyperlipidemia, negative family history for ischemic heart disease, and  non-tobacco-user status.        Further assessment for target organ damage reveals no history of ASHD, cardiac end-organ damage (CHF/LVH), stroke/TIA, peripheral vascular disease, renal insufficiency, or hypertensive retinopathy.     Preventive Screening-Counseling & Management  Alcohol-Tobacco     Alcohol drinks/day: 1     Alcohol type: beer     >5/day in last 3 mos: no     Alcohol Counseling: not indicated; use of alcohol is not excessive or problematic     Feels need to cut down: no     Feels annoyed by complaints: no     Feels guilty re: drinking: no     Needs 'eye opener' in am: no     Smoking Status: quit     Smoking Cessation Counseling: no     Smoke Cessation Stage: quit     Packs/Day: 2.0     Year Started: 1964     Year Quit: 2001     Pack years: 76     Tobacco Counseling: to remain off tobacco products  Hep-HIV-STD-Contraception     Hepatitis Risk: no risk noted     HIV Risk: no risk noted     STD Risk: no risk noted  Current Medications (verified): 1)  Ipratropium-Albuterol 0.5-2.5 (3) Mg/67ml Soln (Ipratropium-Albuterol) .Marland Kitchen.. 1 Vial Per Nebulizer Treatment As Directed 2)  Prilosec 20 Mg Cpdr (  Omeprazole) .Marland Kitchen.. 1 Tab Once Daily 3)  Metoprolol Succinate 25 Mg Tab (Metoprolol Succinate) .Marland Kitchen.. 1 Tab Two Times A Day 4)  Sulindac 200 Mg Tabs (Sulindac) .Marland Kitchen.. 1 Tab Two Times A Day 5)  Losartan Potassium-Hctz 50-12.5 Mg Tabs (Losartan Potassium-Hctz) .... Once Daily 6)  Ventolin Hfa 108 (90 Base) Mcg/act Aers (Albuterol Sulfate) .... 2 Puffs As Needed 7)  Brovana 15 Mcg/81ml Nebu (Arformoterol Tartrate) .... One in [place] Two Times A Day For Lung Disease 8)  Tramadol Hcl 50 Mg Tabs (Tramadol Hcl) .Marland Kitchen.. 1-2 By Mouth Qid As Needed For Pain  Allergies (verified): 1)  ! Ibuprofen  Past History:  Past Medical History: Reviewed history from 06/10/2009 and no changes required. COPD GERD Hypertension Osteoarthritis  Past Surgical History: Reviewed history from 06/10/2009 and no  changes required. Hysterectomy  Family History: Reviewed history from 06/10/2009 and no changes required. Family History Hypertension  Social History: Reviewed history from 06/10/2009 and no changes required. Occupation: school bus Acupuncturist Alcohol use-yes Drug use-no Regular exercise-no  Review of Systems       The patient complains of prolonged cough.  The patient denies anorexia, fever, weight loss, chest pain, dyspnea on exertion, peripheral edema, headaches, hemoptysis, abdominal pain, melena, hematochezia, severe indigestion/heartburn, suspicious skin lesions, enlarged lymph nodes, and angioedema.    Physical Exam  General:  alert, well-nourished, well-hydrated, appropriate dress, normal appearance, healthy-appearing, cooperative to examination, good hygiene, and underweight appearing.   Head:  normocephalic, atraumatic, no abnormalities observed, and no abnormalities palpated.   Ears:  R ear normal and L ear normal.   Nose:  External nasal examination shows no deformity or inflammation. Nasal mucosa are pink and moist without lesions or exudates. Mouth:  Oral mucosa and oropharynx without lesions or exudates.  Teeth in good repair. Neck:  No deformities, masses, or tenderness noted. Lungs:  Normal respiratory effort, chest expands symmetrically. Lungs are clear to auscultation, no crackles or wheezes. Heart:  Normal rate and regular rhythm. S1 and S2 normal without gallop, murmur, click, rub or other extra sounds. Abdomen:  soft, non-tender, normal bowel sounds, no distention, no masses, no guarding, no rigidity, no rebound tenderness, no hepatomegaly, and no splenomegaly.   Msk:  normal ROM, no joint tenderness, no joint swelling, no joint warmth, no redness over joints, no joint deformities, no joint instability, no crepitation, and no muscle atrophy.   Pulses:  R and L carotid,radial,femoral,dorsalis pedis and posterior tibial pulses are full and equal  bilaterally Extremities:  No clubbing, cyanosis, edema, or deformity noted with normal full range of motion of all joints.   Neurologic:  No cranial nerve deficits noted. Station and gait are normal. Plantar reflexes are down-going bilaterally. DTRs are symmetrical throughout. Sensory, motor and coordinative functions appear intact. Skin:  Intact without suspicious lesions or rashes Cervical Nodes:  no anterior cervical adenopathy and no posterior cervical adenopathy.   Axillary Nodes:  no R axillary adenopathy and no L axillary adenopathy.   Psych:  Cognition and judgment appear intact. Alert and cooperative with normal attention span and concentration. No apparent delusions, illusions, hallucinations   Impression & Recommendations:  Problem # 1:  COUGH (ICD-786.2) Assessment New will check a chest xray to look for masses, edema, etc. I don;t see any evidence of infection. will stop losartan in the event it  is causing this. i think this will end up being part of COPD. am pleased that she has stopped smoking. Orders: T-2 View CXR ([id number])  Problem #  2:  TRANSAMINASES, SERUM, ELEVATED (ICD-790.4) Assessment: Improved  Problem # 3:  HYPERTENSION (ICD-401.9) Assessment: Deteriorated  The following medications were removed from the medication list:    Losartan Potassium-hctz 50-12.5 Mg Tabs (Losartan potassium-hctz) ..... Once daily Her updated medication list for this problem includes:    Tekturna Hct 150-25 Mg Tabs (Aliskiren-hydrochlorothiazide) ..... One by mouth each day for high blood pressure  BP today: 144/98 Prior BP: 130/88 (06/24/2009)  Prior 10 Yr Risk Heart Disease: Not enough information (06/10/2009)  Labs Reviewed: K+: 3.7 (06/10/2009) Creat: : 0.9 (06/10/2009)   Chol: 205 (06/10/2009)   HDL: 42.70 (06/10/2009)   TG: 178.0 (06/10/2009)  Problem # 4:  COPD (ICD-496) Assessment: Improved  Her updated medication list for this problem includes:     Ipratropium-albuterol 0.5-2.5 (3) Mg/10ml Soln (Ipratropium-albuterol) .Marland Kitchen... 1 vial per nebulizer treatment as directed    Ventolin Hfa 108 (90 Base) Mcg/act Aers (Albuterol sulfate) .Marland Kitchen... 2 puffs as needed    Brovana 15 Mcg/31ml Nebu (Arformoterol tartrate) ..... One in neb two times a day for lung disease  Complete Medication List: 1)  Ipratropium-albuterol 0.5-2.5 (3) Mg/46ml Soln (Ipratropium-albuterol) .Marland Kitchen.. 1 vial per nebulizer treatment as directed 2)  Prilosec 20 Mg Cpdr (Omeprazole) .Marland Kitchen.. 1 tab once daily 3)  Metoprolol Succinate 25 Mg Tab (metoprolol Succinate)  .Marland Kitchen.. 1 tab two times a day 4)  Sulindac 200 Mg Tabs (Sulindac) .Marland Kitchen.. 1 tab two times a day 5)  Ventolin Hfa 108 (90 Base) Mcg/act Aers (Albuterol sulfate) .... 2 puffs as needed 6)  Brovana 15 Mcg/24ml Nebu (Arformoterol tartrate) .... One in neb two times a day for lung disease 7)  Tramadol Hcl 50 Mg Tabs (Tramadol hcl) .Marland Kitchen.. 1-2 by mouth qid as needed for pain 8)  Tekturna Hct 150-25 Mg Tabs (Aliskiren-hydrochlorothiazide) .... One by mouth each day for high blood pressure  Hypertension Assessment/Plan:      The patient's hypertensive risk group is category B: At least one risk factor (excluding diabetes) with no target organ damage.  Today's blood pressure is 144/98.  Her blood pressure goal is < 140/90.  Patient Instructions: 1)  Please schedule a follow-up appointment in 1 month. 2)  Tobacco is very bad for your health and your loved ones! You Should stop smoking!. 3)  Stop Smoking Tips: Choose a Quit date. Cut down before the Quit date. decide what you will do as a substitute when you feel the urge to smoke(gum,toothpick,exercise). 4)  It is important that you exercise regularly at least 20 minutes 5 times a week. If you develop chest pain, have severe difficulty breathing, or feel very tired , stop exercising immediately and seek medical attention. 5)  Check your Blood Pressure regularly. If it is above 140/90: you should make  an appointment. Prescriptions: BROVANA 15 MCG/2ML NEBU (ARFORMOTEROL TARTRATE) One in neb two times a day for lung disease  #60 x 11   Entered and Authorized by:   Etta Grandchild MD   Signed by:   Etta Grandchild MD on 07/29/2009   Method used:   Printed then faxed to ...       [facility] (retail)       [address]       [place], [place]  [address]       Ph: [phone]       Fax: [phone]   RxID:   [id number] TEKTURNA HCT 150-25 MG TABS (ALISKIREN-HYDROCHLOROTHIAZIDE) One  by mouth each day for high blood pressure  #84 x 0   Entered and Authorized by:   Etta Grandchild MD   Signed by:   Etta Grandchild MD on 07/29/2009   Method used:   Samples Given   RxID:   [id number]    Not Administered:    Influenza Vaccine not given due to: vaccine availability

## 2010-04-25 NOTE — Miscellaneous (Signed)
Summary: Orders Update pft charges  Clinical Lists Changes  Orders: Added new Service order of Carbon Monoxide diffusing w/capacity (94720) - Signed Added new Service order of Lung Volumes (94240) - Signed Added new Service order of Spirometry (Pre & Post) (94060) - Signed 

## 2010-04-25 NOTE — Progress Notes (Signed)
  Phone Note Call from Patient   Caller: Patient Summary of Call: Pt called b/c her cough has come back and wanting a refill on cough meds. Wants an appt about cough. Initial call taken by: Alysia Penna,  December 16, 2009 11:15 AM  Follow-up for Phone Call        called pt. informed that MD wants her to have OV before refill. Made an appt for friday 9:45am per LOVFIEP.  Follow-up by: Alysia Penna,  December 16, 2009 11:17 AM

## 2010-04-25 NOTE — Letter (Signed)
Summary: Previsit letter  [facility]  [address] [place], [place] [address]   Phone: [phone]  Fax: [phone]       08/09/2009 MRN: [id number]  [facility] [address] [place], [place]  [address]  Dear Ms. Jost,  Welcome to the Gastroenterology Division at [facility].    You are scheduled to see a nurse for your pre-procedure visit on 09/06/2009 at 2:00PM on the 3rd floor at [facility], [address]  We ask that you try to arrive at our office 15 minutes prior to your appointment time to allow for check-in.  Your nurse visit will consist of discussing your medical and surgical history, your immediate family medical history, and your medications.    Please bring a complete list of all your medications or, if you prefer, bring the medication bottles and we will list them.  We will need to be aware of both prescribed and over the counter drugs.  We will need to know exact dosage information as well.  If you are on blood thinners (Coumadin, Plavix, Aggrenox, Ticlid, etc.) please call our office today/prior to your appointment, as we need to consult with your physician about holding your medication.   Please be prepared to read and sign documents such as consent forms, a financial agreement, and acknowledgement forms.  If necessary, and with your consent, a friend or relative is welcome to sit-in on the nurse visit with you.  Please bring your insurance card so that we may make a copy of it.  If your insurance requires a referral to see a specialist, please bring your referral form from your primary care physician.  No co-pay is required for this nurse visit.     If you cannot keep your appointment, please call [phone] to cancel or reschedule prior to your appointment date.  This allows [place] the opportunity to schedule an appointment for another patient in need of care.    Thank you for choosing [facility] for your medical  needs.  We appreciate the opportunity to care for you.  Please visit [place] at our website  to learn more about our practice.                     Sincerely.                                                                                                                   The Gastroenterology Division

## 2010-04-25 NOTE — Assessment & Plan Note (Signed)
Summary: contiuned cough/ discuss med refill   Vital Signs:  Patient profile:   65 year old female Height:      62 inches Weight:      153.38 pounds O2 Sat:      90 % on Room air Temp:     99.1 degrees F oral Pulse rate:   96 / minute Pulse rhythm:   regular Resp:     16 per minute BP sitting:   132 / 82  (right arm) Cuff size:   regular  O2 Flow:  Room air CC: patient complains of continued couch/pb, URI symptoms, Hypertension Management   Primary Care Provider:  Etta Grandchild MD  CC:  patient complains of continued couch/pb, URI symptoms, and Hypertension Management.  History of Present Illness:  URI Symptoms      This is a 65 year old woman who presents with URI symptoms.  The symptoms began 1 week ago.  The severity is described as mild.  The patient reports productive cough, but denies nasal congestion, clear nasal discharge, purulent nasal discharge, sore throat, dry cough, earache, and sick contacts.  The patient denies fever, stiff neck, dyspnea, wheezing, rash, vomiting, diarrhea, use of an antipyretic, and response to antipyretic.  The patient denies sneezing, seasonal symptoms, headache, muscle aches, and severe fatigue.  The patient denies the following risk factors for Strep sinusitis: unilateral facial pain, unilateral nasal discharge, poor response to decongestant, double sickening, tooth pain, Strep exposure, tender adenopathy, and absence of cough.    Hypertension History:      She denies headache, chest pain, palpitations, dyspnea with exertion, orthopnea, PND, peripheral edema, visual symptoms, neurologic problems, syncope, and side effects from treatment.  She notes no problems with any antihypertensive medication side effects.        Positive major cardiovascular risk factors include female age 79 years old or older and hypertension.  Negative major cardiovascular risk factors include no history of diabetes or hyperlipidemia, negative family history for ischemic  heart disease, and non-tobacco-user status.        Further assessment for target organ damage reveals no history of ASHD, cardiac end-organ damage (CHF/LVH), stroke/TIA, peripheral vascular disease, renal insufficiency, or hypertensive retinopathy.     Preventive Screening-Counseling & Management  Alcohol-Tobacco     Alcohol drinks/day: 1     Alcohol type: beer     >5/day in last 3 mos: no     Alcohol Counseling: not indicated; use of alcohol is not excessive or problematic     Feels need to cut down: no     Feels annoyed by complaints: no     Feels guilty re: drinking: no     Needs 'eye opener' in am: no     Smoking Status: quit     Smoking Cessation Counseling: no     Smoke Cessation Stage: quit     Packs/Day: 2.0     Year Started: 1965     Year Quit: 2001     Pack years: 82     Tobacco Counseling: to remain off tobacco products  Hep-HIV-STD-Contraception     Hepatitis Risk: no risk noted     HIV Risk: no risk noted     STD Risk: no risk noted      Sexual History:  currently monogamous.        Drug Use:  no.        Blood Transfusions:  no.    Medications Prior to Update: 1)  Prilosec  20 Mg Cpdr (Omeprazole) .Marland Kitchen.. 1 Tab Once Daily 2)  Metoprolol Succinate 25 Mg Tab (Metoprolol Succinate) .Marland Kitchen.. 1 Tab Two Times A Day 3)  Sulindac 200 Mg Tabs (Sulindac) .Marland Kitchen.. 1 Tab Two Times A Day 4)  Tramadol Hcl 50 Mg Tabs (Tramadol Hcl) .Marland Kitchen.. 1-2 By Mouth Qid As Needed For Pain 5)  Tekturna Hct 150-25 Mg Tabs (Aliskiren-Hydrochlorothiazide) .... One By Mouth Each Day For High Blood Pressure 6)  Spiriva Handihaler 18 Mcg Caps (Tiotropium Bromide Monohydrate) .... One Puff Once Daily Into The Lungs 7)  Tussionex Pennkinetic Er 8-10 Mg/28ml Lqcr (Chlorpheniramine-Hydrocodone) .... 2.5 - 5 Ml By Mouth Two Times A Day As Needed For Cough  Current Medications (verified): 1)  Prilosec 20 Mg Cpdr (Omeprazole) .Marland Kitchen.. 1 Tab Once Daily 2)  Metoprolol Succinate 25 Mg Tab (Metoprolol Succinate) .Marland Kitchen.. 1 Tab  Two Times A Day 3)  Sulindac 200 Mg Tabs (Sulindac) .Marland Kitchen.. 1 Tab Two Times A Day 4)  Tramadol Hcl 50 Mg Tabs (Tramadol Hcl) .Marland Kitchen.. 1-2 By Mouth Qid As Needed For Pain 5)  Tekturna Hct 150-25 Mg Tabs (Aliskiren-Hydrochlorothiazide) .... One By Mouth Each Day For High Blood Pressure 6)  Spiriva Handihaler 18 Mcg Caps (Tiotropium Bromide Monohydrate) .... One Puff Once Daily Into The Lungs 7)  Tussionex Pennkinetic Er 8-10 Mg/6ml Lqcr (Chlorpheniramine-Hydrocodone) .... 2.5 - 5 Ml By Mouth Two Times A Day As Needed For Cough 8)  Avelox Abc Pack 400 Mg Tabs (Moxifloxacin Hcl) .... One By Mouth Once Daily For 5 Days  Allergies (verified): 1)  ! Ibuprofen  Past History:  Past Medical History: Last updated: 06/10/2009 COPD GERD Hypertension Osteoarthritis  Past Surgical History: Last updated: 06/10/2009 Hysterectomy  Family History: Last updated: 06/10/2009 Family History Hypertension  Social History: Last updated: 06/10/2009 Occupation: school bus driver [employer] Alcohol use-yes Drug use-no Regular exercise-no  Risk Factors: Alcohol Use: 1 (12/23/2009) >5 drinks/d w/in last 3 months: no (12/23/2009) Exercise: no (06/10/2009)  Risk Factors: Smoking Status: quit (12/23/2009) Packs/Day: 2.0 (12/23/2009)  Family History: Reviewed history from 06/10/2009 and no changes required. Family History Hypertension  Social History: Reviewed history from 06/10/2009 and no changes required. Occupation: school bus Acupuncturist Alcohol use-yes Drug use-no Regular exercise-no  Review of Systems  The patient denies anorexia, fever, weight loss, weight gain, chest pain, syncope, dyspnea on exertion, peripheral edema, headaches, hemoptysis, abdominal pain, hematuria, suspicious skin lesions, abnormal bleeding, and enlarged lymph nodes.    Physical Exam  General:  alert, well-nourished, well-hydrated, appropriate dress, normal appearance, healthy-appearing, cooperative to  examination, good hygiene, and underweight appearing.   Ears:  R ear normal and L ear normal.   Nose:  External nasal examination shows no deformity or inflammation. Nasal mucosa are pink and moist without lesions or exudates. Mouth:  Oral mucosa and oropharynx without lesions or exudates.  Teeth in good repair. Neck:  No deformities, masses, or tenderness noted. Lungs:  Normal respiratory effort, chest expands symmetrically. Lungs are clear to auscultation, no crackles or wheezes. Heart:  Normal rate and regular rhythm. S1 and S2 normal without gallop, murmur, click, rub or other extra sounds. Abdomen:  soft, non-tender, normal bowel sounds, no distention, no masses, no guarding, no rigidity, no rebound tenderness, no hepatomegaly, and no splenomegaly.   Msk:  normal ROM, no joint tenderness, no joint swelling, no joint warmth, no redness over joints, no joint deformities, no joint instability, no crepitation, and no muscle atrophy.   Pulses:  R and L carotid,radial,femoral,dorsalis pedis and posterior tibial  pulses are full and equal bilaterally Extremities:  No clubbing, cyanosis, edema, or deformity noted with normal full range of motion of all joints.   Neurologic:  No cranial nerve deficits noted. Station and gait are normal. Plantar reflexes are down-going bilaterally. DTRs are symmetrical throughout. Sensory, motor and coordinative functions appear intact. Skin:  Intact without suspicious lesions or rashes Cervical Nodes:  no anterior cervical adenopathy and no posterior cervical adenopathy.   Axillary Nodes:  no R axillary adenopathy and no L axillary adenopathy.   Inguinal Nodes:  no R inguinal adenopathy and no L inguinal adenopathy.   Psych:  Cognition and judgment appear intact. Alert and cooperative with normal attention span and concentration. No apparent delusions, illusions, hallucinations   Impression & Recommendations:  Problem # 1:  HYPERTENSION (ICD-401.9) Assessment  Unchanged  Her updated medication list for this problem includes:    Tekturna Hct 150-25 Mg Tabs (Aliskiren-hydrochlorothiazide) ..... One by mouth each day for high blood pressure  BP today: 132/82 Prior BP: 122/78 (10/14/2009)  Prior 10 Yr Risk Heart Disease: Not enough information (06/10/2009)  Labs Reviewed: K+: 3.7 (06/10/2009) Creat: : 0.9 (06/10/2009)   Chol: 205 (06/10/2009)   HDL: 42.70 (06/10/2009)   TG: 178.0 (06/10/2009)  Problem # 2:  BRONCHITIS-ACUTE (ICD-466.0) Assessment: New  Her updated medication list for this problem includes:    Spiriva Handihaler 18 Mcg Caps (Tiotropium bromide monohydrate) ..... One puff once daily into the lungs    Tussionex Pennkinetic Er 8-10 Mg/11ml Lqcr (Chlorpheniramine-hydrocodone) .Marland Kitchen... 2.5 - 5 ml by mouth two times a day as needed for cough    Avelox Abc Pack 400 Mg Tabs (Moxifloxacin hcl) ..... One by mouth once daily for 5 days  Problem # 3:  OSTEOARTHRITIS (ICD-715.90) Assessment: Unchanged  Her updated medication list for this problem includes:    Sulindac 200 Mg Tabs (Sulindac) .Marland Kitchen... 1 tab two times a day    Tramadol Hcl 50 Mg Tabs (Tramadol hcl) .Marland Kitchen... 1-2 by mouth qid as needed for pain  Problem # 4:  COPD (ICD-496) Assessment: Unchanged  Her updated medication list for this problem includes:    Spiriva Handihaler 18 Mcg Caps (Tiotropium bromide monohydrate) ..... One puff once daily into the lungs  Complete Medication List: 1)  Prilosec 20 Mg Cpdr (Omeprazole) .Marland Kitchen.. 1 tab once daily 2)  Metoprolol Succinate 25 Mg Tab (metoprolol Succinate)  .Marland Kitchen.. 1 tab two times a day 3)  Sulindac 200 Mg Tabs (Sulindac) .Marland Kitchen.. 1 tab two times a day 4)  Tramadol Hcl 50 Mg Tabs (Tramadol hcl) .Marland Kitchen.. 1-2 by mouth qid as needed for pain 5)  Tekturna Hct 150-25 Mg Tabs (Aliskiren-hydrochlorothiazide) .... One by mouth each day for high blood pressure 6)  Spiriva Handihaler 18 Mcg Caps (Tiotropium bromide monohydrate) .... One puff once daily into  the lungs 7)  Tussionex Pennkinetic Er 8-10 Mg/65ml Lqcr (Chlorpheniramine-hydrocodone) .... 2.5 - 5 ml by mouth two times a day as needed for cough 8)  Avelox Abc Pack 400 Mg Tabs (Moxifloxacin hcl) .... One by mouth once daily for 5 days  Hypertension Assessment/Plan:      The patient's hypertensive risk group is category B: At least one risk factor (excluding diabetes) with no target organ damage.  Today's blood pressure is 132/82.  Her blood pressure goal is < 140/90.  Patient Instructions: 1)  Please schedule a follow-up appointment in 2 weeks. 2)  Check your Blood Pressure regularly. If it is above 140/90: you should make an appointment.  3)  Take your antibiotic as prescribed until ALL of it is gone, but stop if you develop a rash or swelling and contact our office as soon as possible. 4)  Acute bronchitis symptoms for less than 10 days are not helped by antibiotics. take over the counter cough medications. call if no improvment in  5-7 days, sooner if increasing cough, fever, or new symptoms( shortness of breath, chest pain). Prescriptions: AVELOX ABC PACK 400 MG TABS (MOXIFLOXACIN HCL) One by mouth once daily for 5 days  #5 x 1   Entered and Authorized by:   Etta Grandchild MD   Signed by:   Etta Grandchild MD on 12/23/2009   Method used:   Print then Give to Patient   RxID:   [id number] TUSSIONEX PENNKINETIC ER 8-10 MG/5ML LQCR (CHLORPHENIRAMINE-HYDROCODONE) 2.5 - 5 ml by mouth two times a day as needed for cough  #4 ounces x 1   Entered and Authorized by:   Etta Grandchild MD   Signed by:   Etta Grandchild MD on 12/23/2009   Method used:   Print then Give to Patient   RxID:   [id number] SULINDAC 200 MG TABS (SULINDAC) 1 tab two times a day  #60 x 11   Entered and Authorized by:   Etta Grandchild MD   Signed by:   Etta Grandchild MD on 12/23/2009   Method used:   Print then Give to Patient   RxID:   [id number]

## 2010-04-25 NOTE — Progress Notes (Signed)
Summary: samples  ---- Converted from flag ---- ---- 12/29/2009 9:23 AM, Etta Grandchild MD wrote: Toula Moos- tell her that I have 6 more spiriva inhalers for her. Thanks, TJ ------------------------------  Phone Note Outgoing Call   Call placed by: Rock Nephew CMA,  December 29, 2009 9:47 AM Call placed to: Patient Summary of Call: Called patient/LMOVM to pick up Initial call taken by: Rock Nephew CMA,  December 29, 2009 9:47 AM

## 2010-04-25 NOTE — Assessment & Plan Note (Signed)
Summary: cough-lb   Vital Signs:  Patient profile:   65 year old female Menstrual status:  hysterectomy Height:      62 inches Weight:      158.25 pounds BMI:     29.05 O2 Sat:      98 % on Room air Temp:     98.5 degrees F oral Pulse rate:   100 / minute Pulse rhythm:   regular Resp:     16 per minute BP sitting:   132 / 80  (left arm) Cuff size:   regular  Vitals Entered By: Rock Nephew CMA (January 27, 2010 10:04 AM)  Nutrition Counseling: Patient's BMI is greater than 25 and therefore counseled on weight management options.  O2 Flow:  Room air CC: patient c/o continued cough  Does patient need assistance? Functional Status Self care Ambulation Normal     Menstrual Status hysterectomy Last PAP Result normal   Primary Care Provider:  Etta Grandchild MD  CC:  patient c/o continued cough.  History of Present Illness: She returns for f/up and she complains of persistent cough that is productive of clear to white foamy phlegm.  Preventive Screening-Counseling & Management  Alcohol-Tobacco     Alcohol drinks/day: 1     Alcohol type: beer     >5/day in last 3 mos: no     Alcohol Counseling: not indicated; use of alcohol is not excessive or problematic     Feels need to cut down: no     Feels annoyed by complaints: no     Feels guilty re: drinking: no     Needs 'eye opener' in am: no     Smoking Status: quit     Smoking Cessation Counseling: no     Smoke Cessation Stage: quit     Packs/Day: 2.0     Year Started: 1964     Year Quit: 2001     Pack years: 25     Tobacco Counseling: to remain off tobacco products  Hep-HIV-STD-Contraception     Hepatitis Risk: no risk noted     HIV Risk: no risk noted     STD Risk: no risk noted      Sexual History:  currently monogamous.        Drug Use:  no.        Blood Transfusions:  no.    Medications Prior to Update: 1)  Prilosec 20 Mg Cpdr (Omeprazole) .Marland Kitchen.. 1 Tab Once Daily 2)  Metoprolol Succinate 25 Mg Tab  (Metoprolol Succinate) .Marland Kitchen.. 1 Tab Two Times A Day 3)  Sulindac 200 Mg Tabs (Sulindac) .Marland Kitchen.. 1 Tab Two Times A Day 4)  Tramadol Hcl 50 Mg Tabs (Tramadol Hcl) .Marland Kitchen.. 1-2 By Mouth Qid As Needed For Pain 5)  Tekturna Hct 150-25 Mg Tabs (Aliskiren-Hydrochlorothiazide) .... One By Mouth Each Day For High Blood Pressure 6)  Spiriva Handihaler 18 Mcg Caps (Tiotropium Bromide Monohydrate) .... One Puff Once Daily Into The Lungs 7)  Tussionex Pennkinetic Er 8-10 Mg/14ml Lqcr (Chlorpheniramine-Hydrocodone) .... 2.5 - 5 Ml By Mouth Two Times A Day As Needed For Cough  Current Medications (verified): 1)  Prilosec 20 Mg Cpdr (Omeprazole) .Marland Kitchen.. 1 Tab Once Daily 2)  Metoprolol Succinate 25 Mg Tab (Metoprolol Succinate) .Marland Kitchen.. 1 Tab Two Times A Day 3)  Sulindac 200 Mg Tabs (Sulindac) .Marland Kitchen.. 1 Tab Two Times A Day 4)  Tramadol Hcl 50 Mg Tabs (Tramadol Hcl) .Marland Kitchen.. 1-2 By Mouth Qid As Needed For Pain 5)  Tekturna Hct 150-25 Mg Tabs (Aliskiren-Hydrochlorothiazide) .... One By Mouth Each Day For High Blood Pressure 6)  Spiriva Handihaler 18 Mcg Caps (Tiotropium Bromide Monohydrate) .... One Puff Once Daily Into The Lungs 7)  Tussionex Pennkinetic Er 8-10 Mg/87ml Lqcr (Chlorpheniramine-Hydrocodone) .... 2.5 - 5 Ml By Mouth Two Times A Day As Needed For Cough  Allergies (verified): 1)  ! Ibuprofen  Past History:  Past Medical History: Last updated: 06/10/2009 COPD GERD Hypertension Osteoarthritis  Past Surgical History: Last updated: 06/10/2009 Hysterectomy  Family History: Last updated: 06/10/2009 Family History Hypertension  Social History: Last updated: 06/10/2009 Occupation: school bus driver [employer] Alcohol use-yes Drug use-no Regular exercise-no  Risk Factors: Alcohol Use: 1 (01/27/2010) >5 drinks/d w/in last 3 months: no (01/27/2010) Exercise: no (06/10/2009)  Risk Factors: Smoking Status: quit (01/27/2010) Packs/Day: 2.0 (01/27/2010)  Family History: Reviewed history from 06/10/2009  and no changes required. Family History Hypertension  Social History: Reviewed history from 06/10/2009 and no changes required. Occupation: school bus Acupuncturist Alcohol use-yes Drug use-no Regular exercise-no  Review of Systems       The patient complains of prolonged cough.  The patient denies anorexia, fever, weight loss, weight gain, chest pain, syncope, peripheral edema, headaches, hemoptysis, abdominal pain, suspicious skin lesions, difficulty walking, and enlarged lymph nodes.   Resp:  Complains of cough, shortness of breath, and sputum productive; denies chest discomfort, chest pain with inspiration, coughing up blood, excessive snoring, hypersomnolence, pleuritic, and wheezing.  Physical Exam  General:  alert, well-nourished, well-hydrated, appropriate dress, normal appearance, healthy-appearing, cooperative to examination, good hygiene, and underweight appearing.   Head:  normocephalic, atraumatic, no abnormalities observed, and no abnormalities palpated.   Eyes:  No icterus, pink moist mm., Mouth:  Oral mucosa and oropharynx without lesions or exudates.  Teeth in good repair. Neck:  No deformities, masses, or tenderness noted. Lungs:  Normal respiratory effort, chest expands symmetrically. Lungs are clear to auscultation, no crackles or wheezes. Heart:  Normal rate and regular rhythm. S1 and S2 normal without gallop, murmur, click, rub or other extra sounds. Abdomen:  soft, non-tender, normal bowel sounds, no distention, no masses, no guarding, no rigidity, no rebound tenderness, no hepatomegaly, and no splenomegaly.   Msk:  normal ROM, no joint tenderness, no joint swelling, no joint warmth, no redness over joints, no joint deformities, no joint instability, no crepitation, and no muscle atrophy.   Pulses:  R and L carotid,radial,femoral,dorsalis pedis and posterior tibial pulses are full and equal bilaterally Extremities:  No clubbing, cyanosis, edema, or deformity  noted with normal full range of motion of all joints.   Neurologic:  No cranial nerve deficits noted. Station and gait are normal. Plantar reflexes are down-going bilaterally. DTRs are symmetrical throughout. Sensory, motor and coordinative functions appear intact. Skin:  Intact without suspicious lesions or rashes Cervical Nodes:  no anterior cervical adenopathy and no posterior cervical adenopathy.   Axillary Nodes:  no R axillary adenopathy and no L axillary adenopathy.   Psych:  Cognition and judgment appear intact. Alert and cooperative with normal attention span and concentration. No apparent delusions, illusions, hallucinations   Impression & Recommendations:  Problem # 1:  COUGH (ICD-786.2) Assessment New will check for masses, pna, edema, etc. Orders: T-2 View CXR (71020TC) Pulmonary Referral (Pulmonary)  Problem # 2:  HYPERTENSION (ICD-401.9) Assessment: Improved  Her updated medication list for this problem includes:    Tekturna Hct 150-25 Mg Tabs (Aliskiren-hydrochlorothiazide) ..... One by mouth each day for high blood pressure  BP today: 132/80 Prior BP: 132/82 (12/23/2009)  Prior 10 Yr Risk Heart Disease: Not enough information (06/10/2009)  Labs Reviewed: K+: 3.7 (06/10/2009) Creat: : 0.9 (06/10/2009)   Chol: 205 (06/10/2009)   HDL: 42.70 (06/10/2009)   TG: 178.0 (06/10/2009)  Problem # 3:  COPD (ICD-496) Assessment: Deteriorated  Her updated medication list for this problem includes:    Spiriva Handihaler 18 Mcg Caps (Tiotropium bromide monohydrate) ..... One puff once daily into the lungs  Orders: Pulmonary Referral (Pulmonary)  Pulmonary Functions Reviewed: O2 sat: 98 (01/27/2010)     Vaccines Reviewed: Pneumovax: Pneumovax (06/10/2009)     Complete Medication List: 1)  Prilosec 20 Mg Cpdr (Omeprazole) .Marland Kitchen.. 1 tab once daily 2)  Metoprolol Succinate 25 Mg Tab (metoprolol Succinate)  .Marland Kitchen.. 1 tab two times a day 3)  Sulindac 200 Mg Tabs (Sulindac) .Marland Kitchen..  1 tab two times a day 4)  Tramadol Hcl 50 Mg Tabs (Tramadol hcl) .Marland Kitchen.. 1-2 by mouth qid as needed for pain 5)  Tekturna Hct 150-25 Mg Tabs (Aliskiren-hydrochlorothiazide) .... One by mouth each day for high blood pressure 6)  Spiriva Handihaler 18 Mcg Caps (Tiotropium bromide monohydrate) .... One puff once daily into the lungs 7)  Tussionex Pennkinetic Er 8-10 Mg/93ml Lqcr (Chlorpheniramine-hydrocodone) .... 2.5 - 5 ml by mouth two times a day as needed for cough  Patient Instructions: 1)  Please schedule a follow-up appointment in 2 months. 2)  Check your Blood Pressure regularly. If it is above 140/90: you should make an appointment. Prescriptions: TUSSIONEX PENNKINETIC ER 8-10 MG/5ML LQCR (CHLORPHENIRAMINE-HYDROCODONE) 2.5 - 5 ml by mouth two times a day as needed for cough  #4 ounces x 2   Entered and Authorized by:   Etta Grandchild MD   Signed by:   Etta Grandchild MD on 01/27/2010   Method used:   Print then Give to Patient   RxID:   [id number]    Orders Added: 1)  T-2 View CXR [71020TC] 2)  Pulmonary Referral [Pulmonary] 3)  Est. Patient Level V [62130]    Prevention & Chronic Care Immunizations   Influenza vaccine: Not documented   Influenza vaccine deferral: Refused  (01/27/2010)    Tetanus booster: 06/10/2009: Tdap    Pneumococcal vaccine: Pneumovax  (06/10/2009)    H. zoster vaccine: Not documented   H. zoster vaccine deferral: Refused  (01/27/2010)  Colorectal Screening   Hemoccult: Not documented    Colonoscopy: Not documented  Other Screening   Pap smear: normal  (03/26/2006)    Mammogram: normal  (03/26/2008)    DXA bone density scan: Not documented   Smoking status: quit  (01/27/2010)  Lipids   Total Cholesterol: 205  (06/10/2009)   LDL: Not documented   LDL Direct: 144.9  (06/10/2009)   HDL: 42.70  (06/10/2009)   Triglycerides: 178.0  (06/10/2009)  Hypertension   Last Blood Pressure: 132 / 80  (01/27/2010)   Serum creatinine: 0.9   (06/10/2009)   Serum potassium 3.7  (06/10/2009)  Self-Management Support :    Hypertension self-management support: Not documented

## 2010-04-25 NOTE — Assessment & Plan Note (Signed)
Summary: NEW [employer] PKG--STC   Vital Signs:  Patient profile:   65 year old female Height:      62 inches (157.48 cm) Weight:      156.50 pounds (71.14 kg) BMI:     28.73 O2 Sat:      95 % on Room air Temp:     98.7 degrees F (37.06 degrees C) oral Pulse rate:   118 / minute Pulse rhythm:   regular Resp:     20 per minute BP sitting:   126 / 82  (left arm) Cuff size:   regular  Vitals Entered By: Rock Nephew CMA (June 10, 2009 10:26 AM) Taken by Sydell Axon  O2 Flow:  Room air CC: pt c/o SOB, dyspnea, lightheadedness when walking, Hypertension Management Is Patient Diabetic? No Pain Assessment Patient in pain? no        Primary Care Provider:  Etta Grandchild MD  CC:  pt c/o SOB, dyspnea, lightheadedness when walking, and Hypertension Management.  History of Present Illness: New to me is this pleasant lady who has been seeing Dr. Bascom Levels for lung disease. She describes a worsening of SOB, DOE, and PND for one month. She has been using her nebulizer up to three times a day.  Hypertension History:      She complains of dyspnea with exertion, orthopnea, and PND, but denies headache, chest pain, palpitations, peripheral edema, visual symptoms, neurologic problems, syncope, and side effects from treatment.  She notes no problems with any antihypertensive medication side effects.        Positive major cardiovascular risk factors include female age 80 years old or older and hypertension.  Negative major cardiovascular risk factors include no history of diabetes or hyperlipidemia, negative family history for ischemic heart disease, and non-tobacco-user status.        Further assessment for target organ damage reveals no history of ASHD, cardiac end-organ damage (CHF/LVH), stroke/TIA, peripheral vascular disease, renal insufficiency, or hypertensive retinopathy.     Preventive Screening-Counseling & Management  Alcohol-Tobacco     Alcohol drinks/day: 1     Alcohol type:  beer     >5/day in last 3 mos: no     Alcohol Counseling: not indicated; use of alcohol is not excessive or problematic     Feels need to cut down: no     Feels annoyed by complaints: no     Feels guilty re: drinking: no     Needs 'eye opener' in am: no     Smoking Status: quit     Smoking Cessation Counseling: no     Smoke Cessation Stage: quit     Packs/Day: 2.0     Year Started: 1964     Year Quit: 2001     Pack years: 32     Tobacco Counseling: to remain off tobacco products  Caffeine-Diet-Exercise     Does Patient Exercise: no  Hep-HIV-STD-Contraception     Hepatitis Risk: no risk noted     HIV Risk: no risk noted     STD Risk: no risk noted  Safety-Violence-Falls     Seat Belt Use: yes     Helmet Use: yes     Firearms in the Home: no firearms in the home     Smoke Detectors: yes     Violence in the Home: no risk noted     Sexual Abuse: no      Sexual History:  currently monogamous.  Drug Use:  no.        Blood Transfusions:  no.    Medications Prior to Update: 1)  None  Current Medications (verified): 1)  Ipratropium-Albuterol 0.5-2.5 (3) Mg/18ml Soln (Ipratropium-Albuterol) .Marland Kitchen.. 1 Vial Per Nebulizer Treatment As Directed 2)  Prilosec 20 Mg Cpdr (Omeprazole) .Marland Kitchen.. 1 Tab Once Daily 3)  Metoprolol Succinate 25 Mg Tab (Metoprolol Succinate) .Marland Kitchen.. 1 Tab Two Times A Day 4)  Sulindac 200 Mg Tabs (Sulindac) .Marland Kitchen.. 1 Tab Two Times A Day 5)  Losartan Potassium-Hctz 50-12.5 Mg Tabs (Losartan Potassium-Hctz) .... Once Daily 6)  Ventolin Hfa 108 (90 Base) Mcg/act Aers (Albuterol Sulfate) .... 2 Puffs As Needed 7)  Brovana 15 Mcg/2ml Nebu (Arformoterol Tartrate) .... One in [place] Two Times A Day For Lung Disease  Allergies (verified): No Known Drug Allergies  Past History:  Past Medical History: COPD GERD Hypertension Osteoarthritis  Past Surgical History: Hysterectomy  Family History: Family History Hypertension  Social History: Occupation: school bus  Acupuncturist Alcohol use-yes Drug use-no Regular exercise-no Smoking Status:  quit Packs/Day:  2.0 Hepatitis Risk:  no risk noted HIV Risk:  no risk noted STD Risk:  no risk noted Seat Belt Use:  yes Sexual History:  currently monogamous Blood Transfusions:  no Drug Use:  no Does Patient Exercise:  no  Review of Systems       The patient complains of dyspnea on exertion.  The patient denies anorexia, fever, weight loss, weight gain, decreased hearing, hoarseness, chest pain, syncope, peripheral edema, prolonged cough, headaches, hemoptysis, abdominal pain, melena, hematochezia, severe indigestion/heartburn, hematuria, suspicious skin lesions, abnormal bleeding, enlarged lymph nodes, and angioedema.   Resp:  Complains of shortness of breath; denies chest discomfort, chest pain with inspiration, cough, coughing up blood, excessive snoring, hypersomnolence, morning headaches, pleuritic, sputum productive, and wheezing.  Physical Exam  General:  alert, well-nourished, well-hydrated, appropriate dress, normal appearance, healthy-appearing, cooperative to examination, good hygiene, and underweight appearing.   Head:  normocephalic, atraumatic, no abnormalities observed, and no abnormalities palpated.   Eyes:  vision grossly intact, pupils equal, pupils round, and pupils reactive to light.   Nose:  External nasal examination shows no deformity or inflammation. Nasal mucosa are pink and moist without lesions or exudates. Mouth:  Oral mucosa and oropharynx without lesions or exudates.  Teeth in good repair. Neck:  No deformities, masses, or tenderness noted. Lungs:  she has diffuse bilateral end-inspiratory and end-expiratory wheezes with good air movement. normal respiratory effort, no intercostal retractions, no accessory muscle use, no fremitus, and no crackles.   Heart:  normal rate, regular rhythm, no murmur, no gallop, no rub, and no JVD.   Abdomen:  soft, non-tender, normal bowel  sounds, no distention, no masses, no guarding, no rigidity, no hepatomegaly, and no splenomegaly.   Msk:  No deformity or scoliosis noted of thoracic or lumbar spine.   Pulses:  R and L carotid,radial,femoral,dorsalis pedis and posterior tibial pulses are full and equal bilaterally Extremities:  No clubbing, cyanosis, edema, or deformity noted with normal full range of motion of all joints.   Neurologic:  No cranial nerve deficits noted. Station and gait are normal. Plantar reflexes are down-going bilaterally. DTRs are symmetrical throughout. Sensory, motor and coordinative functions appear intact. Skin:  Intact without suspicious lesions or rashes Cervical Nodes:  no anterior cervical adenopathy and no posterior cervical adenopathy.   Axillary Nodes:  no R axillary adenopathy and no L axillary adenopathy.   Psych:  Cognition and judgment appear intact.  Alert and cooperative with normal attention span and concentration. No apparent delusions, illusions, hallucinations Additional Exam:  EKG shows a HR=108 with a  large P in II, LVH in V4-V6, and diffusely flat T waves. there are no Q waves. Old EKG from Dr. Bascom Levels has been requested.   Impression & Recommendations:  Problem # 1:  DYSPNEA (ICD-786.05) Assessment New  Orders: TLB-Lipid Panel (80061-LIPID) TLB-BMP (Basic Metabolic Panel-BMET) (80048-METABOL) TLB-CBC Platelet - w/Differential (85025-CBCD) TLB-Hepatic/Liver Function Pnl (80076-HEPATIC) TLB-TSH (Thyroid Stimulating Hormone) (21308-MVH) Pulmonary Referral (Pulmonary) EKG w/ Interpretation (93000)  Problem # 2:  COPD (ICD-496) Assessment: Deteriorated I may need to stop her beta/blocker if wheezing persists, will start brovana and check PFT's Her updated medication list for this problem includes:    Ipratropium-albuterol 0.5-2.5 (3) Mg/64ml Soln (Ipratropium-albuterol) .Marland Kitchen... 1 vial per nebulizer treatment as directed    Ventolin Hfa 108 (90 Base) Mcg/act Aers (Albuterol  sulfate) .Marland Kitchen... 2 puffs as needed    Brovana 15 Mcg/97ml Nebu (Arformoterol tartrate) ..... One in neb two times a day for lung disease  Orders: Pulmonary Referral (Pulmonary)  Problem # 3:  HYPERTENSION (ICD-401.9) Assessment: Improved  Her updated medication list for this problem includes:    Losartan Potassium-hctz 50-12.5 Mg Tabs (Losartan potassium-hctz) ..... Once daily  Orders: TLB-Lipid Panel (80061-LIPID) TLB-BMP (Basic Metabolic Panel-BMET) (80048-METABOL) TLB-CBC Platelet - w/Differential (85025-CBCD) TLB-Hepatic/Liver Function Pnl (80076-HEPATIC) TLB-TSH (Thyroid Stimulating Hormone) (84443-TSH) EKG w/ Interpretation (93000)  BP today: 126/82  10 Yr Risk Heart Disease: Not enough information  Complete Medication List: 1)  Ipratropium-albuterol 0.5-2.5 (3) Mg/40ml Soln (Ipratropium-albuterol) .Marland Kitchen.. 1 vial per nebulizer treatment as directed 2)  Prilosec 20 Mg Cpdr (Omeprazole) .Marland Kitchen.. 1 tab once daily 3)  Metoprolol Succinate 25 Mg Tab (metoprolol Succinate)  .Marland Kitchen.. 1 tab two times a day 4)  Sulindac 200 Mg Tabs (Sulindac) .Marland Kitchen.. 1 tab two times a day 5)  Losartan Potassium-hctz 50-12.5 Mg Tabs (Losartan potassium-hctz) .... Once daily 6)  Ventolin Hfa 108 (90 Base) Mcg/act Aers (Albuterol sulfate) .... 2 puffs as needed 7)  Brovana 15 Mcg/4ml Nebu (Arformoterol tartrate) .... One in neb two times a day for lung disease  Other Orders: Radiology Referral (Radiology) Tdap => 67yrs IM (84696) Admin 1st Vaccine (29528) Pneumococcal Vaccine (41324) Admin of Any Addtl Vaccine (40102) Gastroenterology Referral (GI)  Hypertension Assessment/Plan:      The patient's hypertensive risk group is category B: At least one risk factor (excluding diabetes) with no target organ damage.  Today's blood pressure is 126/82.  Her blood pressure goal is < 140/90.  Patient Instructions: 1)  Please schedule a follow-up appointment in 2 weeks. 2)  It is important that you exercise regularly at  least 20 minutes 5 times a week. If you develop chest pain, have severe difficulty breathing, or feel very tired , stop exercising immediately and seek medical attention. 3)  Check your Blood Pressure regularly. If it is above 140/90: you should make an appointment. Prescriptions: BROVANA 15 MCG/2ML NEBU (ARFORMOTEROL TARTRATE) One in neb two times a day for lung disease  #48 x 0   Entered and Authorized by:   Etta Grandchild MD   Signed by:   Etta Grandchild MD on 06/10/2009   Method used:   Samples Given   RxID:   [id number]    Immunizations Administered:  Tetanus Vaccine:    Vaccine Type: Tdap    Site: right deltoid    Mfr: GlaxoSmithKline    Dose: 0.5 ml  Route: IM    Given by: Rock Nephew CMA/ Administered by Sydell Axon, SMA    Exp. Date: 06/18/2011    Lot #: [id number]    VIS given: 02/11/07 version given June 10, 2009.  Pneumonia Vaccine:    Vaccine Type: Pneumovax    Site: left deltoid    Mfr: Merck    Dose: 0.5 ml    Route: IM    Given by: Rock Nephew CMA/ Administered by Sydell Axon    Exp. Date: 11/07/2010    Lot #: [id number]    VIS given: 10/22/95 version given June 10, 2009.

## 2010-04-25 NOTE — Progress Notes (Signed)
Summary: Samples  Phone Note Call from Patient Call back at [phone]   Summary of Call: Patient is requesting samples of spiriva.  Initial call taken by: Lamar Sprinkles, CMA,  October 18, 2009 2:05 PM  Follow-up for Phone Call        informed pt that we do not have samples of spiriva. I told pt I would look for some samples for her and when we get some I would let her know. I offered to send in prescription but pt states that she cannot afford a prescription. I will call pt when we get samples. Follow-up by: Ami Bullins CMA,  October 19, 2009 2:40 PM

## 2010-04-25 NOTE — Letter (Signed)
Summary: Results Follow-up Letter  [facility]  [address] [place], [place] [address]   Phone: [phone]  Fax: [phone]    06/13/2009  [address] [place], [place]  [address]  Dear Ms. Hartig,   The following are the results of your recent test(s):  Test     Result     Liver       elevated enzyme levels Thyroid     normal Kidney     normal CBC       high allergy cell count   _________________________________________________________  Please call for an appointment soon _________________________________________________________ _________________________________________________________ _________________________________________________________  Sincerely,  Sanda Linger MD [facility]

## 2010-04-25 NOTE — Assessment & Plan Note (Signed)
Summary: 1 MTH FU   STC   Vital Signs:  Patient profile:   65 year old female Height:      62 inches Weight:      155.50 pounds BMI:     28.54 O2 Sat:      94 % on Room air Temp:     98.7 degrees F oral Pulse rate:   80 / minute Pulse rhythm:   regular Resp:     16 per minute BP sitting:   108 / 80  (left arm) Cuff size:   large  Vitals Entered By: Rock Nephew CMA (September 02, 2009 10:13 AM)  Nutrition Counseling: Patient's BMI is greater than 25 and therefore counseled on weight management options.  O2 Flow:  Room air  Primary Care Provider:  Etta Grandchild MD  CC:  Cough.  History of Present Illness:  Cough      This is a 65 year old woman who presents with Cough.  The symptoms began 4-8 weeks ago.  The intensity is described as mild.  The patient reports non-productive cough, but denies productive cough, pleuritic chest pain, shortness of breath, wheezing, exertional dyspnea, fever, hemoptysis, and malaise.  The patient denies the following symptoms: cold/URI symptoms, sore throat, nasal congestion, chronic rhinitis, weight loss, acid reflux symptoms, and peripheral edema.  The cough is worse with cold exposure.  Ineffective prior treatments have included albuterol inhaler and other asthma medication.  Risk factors include chemical exposure.  Diagnostic testing to date has included CXR and spirometry.    Current Medications (verified): 1)  Ipratropium-Albuterol 0.5-2.5 (3) Mg/45ml Soln (Ipratropium-Albuterol) .Marland Kitchen.. 1 Vial Per Nebulizer Treatment As Directed 2)  Prilosec 20 Mg Cpdr (Omeprazole) .Marland Kitchen.. 1 Tab Once Daily 3)  Metoprolol Succinate 25 Mg Tab (Metoprolol Succinate) .Marland Kitchen.. 1 Tab Two Times A Day 4)  Sulindac 200 Mg Tabs (Sulindac) .Marland Kitchen.. 1 Tab Two Times A Day 5)  Ventolin Hfa 108 (90 Base) Mcg/act Aers (Albuterol Sulfate) .... 2 Puffs As Needed 6)  Brovana 15 Mcg/79ml Nebu (Arformoterol Tartrate) .... One in [place] Two Times A Day For Lung Disease 7)  Tramadol Hcl 50 Mg Tabs  (Tramadol Hcl) .Marland Kitchen.. 1-2 By Mouth Qid As Needed For Pain 8)  Tekturna Hct 150-25 Mg Tabs (Aliskiren-Hydrochlorothiazide) .... One By Mouth Each Day For High Blood Pressure  Allergies (verified): 1)  ! 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 Family History Hypertension  Social History: Reviewed history from 06/10/2009 and no changes required. Occupation: school bus Acupuncturist Alcohol use-yes Drug use-no Regular exercise-no  Review of Systems       The patient complains of dyspnea on exertion and prolonged cough.  The patient denies anorexia, fever, weight loss, chest pain, syncope, peripheral edema, headaches, hemoptysis, abdominal pain, suspicious skin lesions, and enlarged lymph nodes.    Physical Exam  General:  alert, well-nourished, well-hydrated, appropriate dress, normal appearance, healthy-appearing, cooperative to examination, good hygiene, and underweight appearing.   Head:  normocephalic, atraumatic, no abnormalities observed, and no abnormalities palpated.  Mouth:  Oral mucosa and oropharynx without lesions or exudates.  Teeth in good repair. Neck:  No deformities, masses, or tenderness noted. Lungs:  Normal respiratory effort, chest expands symmetrically. Lungs are clear to auscultation, no crackles or wheezes. Heart:  Normal rate and  regular rhythm. S1 and S2 normal without gallop, murmur, click, rub or other extra sounds. Abdomen:  soft, non-tender, normal bowel sounds, no distention, no masses, no guarding, no rigidity, no rebound tenderness, no hepatomegaly, and no splenomegaly.   Msk:  normal ROM, no joint tenderness, no joint swelling, no joint warmth, no redness over joints, no joint deformities, no joint instability, no crepitation, and no muscle atrophy.   Pulses:  R and L carotid,radial,femoral,dorsalis pedis and posterior tibial pulses are full and equal bilaterally Extremities:  No clubbing, cyanosis, edema, or deformity noted with normal full range of motion of all joints.   Neurologic:  No cranial nerve deficits noted. Station and gait are normal. Plantar reflexes are down-going bilaterally. DTRs are symmetrical throughout. Sensory, motor and coordinative functions appear intact. Skin:  Intact without suspicious lesions or rashes Cervical Nodes:  no anterior cervical adenopathy and no posterior cervical adenopathy.   Psych:  Cognition and judgment appear intact. Alert and cooperative with normal attention span and concentration. No apparent delusions, illusions, hallucinations   Impression & Recommendations:  Problem # 1:  COPD (ICD-496) Assessment Deteriorated  The following medications were removed from the medication list:    Brovana 15 Mcg/46ml Nebu (Arformoterol tartrate) ..... One in neb two times a day for lung disease Her updated medication list for this problem includes:    Ipratropium-albuterol 0.5-2.5 (3) Mg/41ml Soln (Ipratropium-albuterol) .Marland Kitchen... 1 vial per nebulizer treatment as directed    Ventolin Hfa 108 (90 Base) Mcg/act Aers (Albuterol sulfate) .Marland Kitchen... 2 puffs as needed    Spiriva Handihaler 18 Mcg Caps (Tiotropium bromide monohydrate) ..... One puff once daily into the lungs  Pulmonary Functions Reviewed: O2 sat: 94 (09/02/2009)     Vaccines Reviewed: Pneumovax: Pneumovax (06/10/2009)      Problem # 2:  HYPERTENSION (ICD-401.9) Assessment: Improved  Her updated medication list for this problem includes:    Tekturna Hct 150-25 Mg Tabs (Aliskiren-hydrochlorothiazide) ..... One by mouth each day for high blood pressure  BP today: 108/80 Prior BP: 144/98 (07/29/2009)  Prior 10 Yr Risk Heart Disease: Not enough information (06/10/2009)  Labs Reviewed: K+: 3.7 (06/10/2009) Creat: : 0.9 (06/10/2009)   Chol: 205 (06/10/2009)   HDL: 42.70 (06/10/2009)   TG: 178.0 (06/10/2009)  Complete Medication List: 1)  Ipratropium-albuterol 0.5-2.5 (3) Mg/21ml Soln (Ipratropium-albuterol) .Marland Kitchen.. 1 vial per nebulizer treatment as directed 2)  Prilosec 20 Mg Cpdr (Omeprazole) .Marland Kitchen.. 1 tab once daily 3)  Metoprolol Succinate 25 Mg Tab (metoprolol Succinate)  .Marland Kitchen.. 1 tab two times a day 4)  Sulindac 200 Mg Tabs (Sulindac) .Marland Kitchen.. 1 tab two times a day 5)  Ventolin Hfa 108 (90 Base) Mcg/act Aers (Albuterol sulfate) .... 2 puffs as needed 6)  Tramadol Hcl 50 Mg Tabs (Tramadol hcl) .Marland Kitchen.. 1-2 by mouth qid as needed for pain 7)  Tekturna Hct 150-25 Mg Tabs (Aliskiren-hydrochlorothiazide) .... One by mouth each day for high blood pressure 8)  Spiriva Handihaler 18 Mcg Caps (Tiotropium bromide monohydrate) .... One puff once daily into the lungs  Patient Instructions: 1)  Please schedule a follow-up appointment in 1 month. 2)  Check your Blood Pressure regularly. If it is above 130/80: you should make an appointment. Prescriptions: SPIRIVA HANDIHALER 18 MCG CAPS (  TIOTROPIUM BROMIDE MONOHYDRATE) One puff once daily into the lungs  #20 caps/2 in x 0   Entered and Authorized by:   Etta Grandchild MD   Signed by:   Etta Grandchild MD on 09/02/2009   Method used:   Samples Given   RxID:   [id number]

## 2010-04-27 NOTE — Assessment & Plan Note (Signed)
Summary: POST ER/ BACK PAIN /NWS   Vital Signs:  Patient profile:   65 year old female Menstrual status:  hysterectomy Height:      62 inches Weight:      153.25 pounds BMI:     28.13 O2 Sat:      91 % on Room air Temp:     99.2 degrees F oral Pulse rate:   90 / minute Pulse rhythm:   regular Resp:     16 per minute BP sitting:   130 / 94  (left arm) Cuff size:   large  Vitals Entered By: Rock Nephew CMA (March 08, 2010 11:14 AM)  O2 Flow:  Room air  Primary Care Rotunda Worden:  Etta Grandchild MD  CC:  Back pain.  History of Present Illness: She returns c/o low back pain for 4 days after she stepped and felt a jarring/pulling sensation in her lower back. She was seen in the ER 3 days ago and was given Rx's for Percocet and Valium without much relief. This is a 65 year old woman who presents with Back pain.  The symptoms began 4 days ago.  The intensity is described as moderate.  The patient reports inability to work and inability to care for self, but denies fever, chills, weakness, loss of sensation, fecal incontinence, urinary incontinence, urinary retention, dysuria, and rest pain.  The pain is located in the mid low back.  The pain began at home and suddenly.  The pain radiates to the left hip.  The pain is made worse by standing or walking, flexion, and extension.  The pain is made better by activity.  Risk factors for serious underlying conditions include bedrest with no relief and age >= 50 years.      Hypertension Follow-Up      The patient also presents for Hypertension follow-up.  The patient denies lightheadedness, urinary frequency, headaches, edema, rash, and fatigue.  The patient denies the following associated symptoms: chest pain, chest pressure, exercise intolerance, dyspnea, palpitations, syncope, leg edema, and pedal edema.  Compliance with medications (by patient report) has been sporadic.  The patient reports that dietary compliance has been poor.  The patient  reports no exercise.    Preventive Screening-Counseling & Management  Alcohol-Tobacco     Smoking Cessation Counseling: yes  Current Medications (verified): 1)  Prilosec 20 Mg Cpdr (Omeprazole) .Marland Kitchen.. 1 Tab Once Daily 2)  Metoprolol Succinate 25 Mg Tab (Metoprolol Succinate) .Marland Kitchen.. 1 Tab Two Times A Day 3)  Sulindac 200 Mg Tabs (Sulindac) .Marland Kitchen.. 1 Tab Two Times A Day 4)  Tramadol Hcl 50 Mg Tabs (Tramadol Hcl) .Marland Kitchen.. 1-2 By Mouth Qid As Needed For Pain 5)  Tekturna Hct 150-25 Mg Tabs (Aliskiren-Hydrochlorothiazide) .... One By Mouth Each Day For High Blood Pressure 6)  Spiriva Handihaler 18 Mcg Caps (Tiotropium Bromide Monohydrate) .... One Puff Once Daily Into The Lungs 7)  Percocet 5-325 Mg Tabs (Oxycodone-Acetaminophen) .Marland Kitchen.. 1-2 Every 6hours As Needed 8)  Valium 5 Mg Tabs (Diazepam) .... One Every 6hours As Needed  Allergies: 1)  ! 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 Family History Hypertension  Social History: Reviewed history from 06/10/2009 and no changes required. Occupation: school bus Acupuncturist Alcohol use-yes Drug use-no Regular exercise-no  Review of Systems  The patient denies anorexia, fever, weight loss, weight gain, chest pain, syncope, dyspnea on exertion, peripheral edema, prolonged cough, headaches, hemoptysis, abdominal pain,  hematuria, incontinence, muscle weakness, suspicious skin lesions, difficulty walking, and enlarged lymph nodes.    Physical Exam  General:  alert, well-nourished, well-hydrated, appropriate dress, normal appearance, healthy-appearing, cooperative to examination, good hygiene, and underweight appearing.   Head:  normocephalic, atraumatic, no abnormalities observed, and no abnormalities palpated.   Mouth:  Oral mucosa and oropharynx without lesions or exudates.  Teeth in good repair. Neck:  No deformities, masses, or tenderness noted. Lungs:  Normal respiratory effort, chest expands symmetrically. Lungs are clear to auscultation, no crackles or wheezes. Heart:  Normal rate and regular rhythm. S1 and S2 normal without gallop, murmur, click, rub or other extra sounds. Abdomen:  soft, non-tender, normal bowel sounds, no distention, no masses, no guarding, no rigidity, no rebound tenderness, no hepatomegaly, and no splenomegaly.   Msk:  normal ROM, no joint tenderness, no joint swelling, no joint warmth, no redness over joints, no joint deformities, no joint instability, no crepitation, and no muscle atrophy.   Pulses:  R and L carotid,radial,femoral,dorsalis pedis and posterior tibial pulses are full and equal bilaterally Extremities:  No clubbing, cyanosis, edema, or deformity noted with normal full range of motion of all joints.   Neurologic:  No cranial nerve deficits noted. Station and gait are normal. Plantar reflexes are down-going bilaterally. DTRs are symmetrical throughout. Sensory, motor and coordinative functions appear intact. Skin:  Intact without suspicious lesions or rashes Cervical Nodes:  no anterior cervical adenopathy and no posterior cervical adenopathy.   Psych:  Cognition and judgment appear intact. Alert and cooperative with normal attention span and concentration. No apparent delusions, illusions, hallucinations   Detailed Back/Spine Exam  General:    Well-developed,  well-nourished, in no acute distress; alert and oriented x 3.    Gait:    Normal heel-toe gait pattern bilaterally.    Lumbosacral Exam:  Inspection-deformity:    Normal Palpation-spinal tenderness:  Normal Range of Motion:    Forward Flexion:   60 degrees    Hyperextension:   20 degrees    Right Lateral Bend:   25 degrees    Left Lateral Bend:   15 degrees Squatting:  normal Lying Straight Leg Raise:    Right:  negative    Left:  negative Sitting Straight Leg Raise:    Right:  negative    Left:  negative Reverse Straight Leg Raise:    Right:  negative    Left:  negative Contralateral Straight Leg Raise:    Right:  negative    Left:  negative Sciatic Notch:    There is no sciatic notch tenderness. Toe Walking:    Right:  normal    Left:  normal Heel Walking:    Right:  normal    Left:  normal   Impression & Recommendations:  Problem # 1:  LOW BACK PAIN, ACUTE (ICD-724.2) Assessment New will check plain films for fracture The following medications were removed from the medication list:    Percocet 5-325 Mg Tabs (Oxycodone-acetaminophen) .Marland Kitchen... 1-2 every 6hours as needed Her updated medication list for this problem includes:    Sulindac 200 Mg  Tabs (Sulindac) .Marland Kitchen... 1 tab two times a day    Tramadol Hcl 50 Mg Tabs (Tramadol hcl) .Marland Kitchen... 1-2 by mouth qid as needed for pain    Hydromorphone Hcl 2 Mg Tabs (Hydromorphone hcl) .Marland Kitchen... 1-2 by mouth qid as needed for pain  Orders: T-Lumbar Spine Complete, 5 Views (71110TC)  Problem # 2:  HYPERTENSION (ICD-401.9) Assessment: Deteriorated  Her updated medication list for this problem includes:    Tekturna Hct 150-25 Mg Tabs (Aliskiren-hydrochlorothiazide) ..... One by mouth each day for high blood pressure  Complete Medication List: 1)  Prilosec 20 Mg Cpdr (Omeprazole) .Marland Kitchen.. 1 tab once daily 2)  Metoprolol Succinate 25 Mg Tab (metoprolol Succinate)  .Marland Kitchen.. 1 tab two times a day 3)  Sulindac 200 Mg Tabs (Sulindac) .Marland Kitchen.. 1 tab  two times a day 4)  Tramadol Hcl 50 Mg Tabs (Tramadol hcl) .Marland Kitchen.. 1-2 by mouth qid as needed for pain 5)  Tekturna Hct 150-25 Mg Tabs (Aliskiren-hydrochlorothiazide) .... One by mouth each day for high blood pressure 6)  Spiriva Handihaler 18 Mcg Caps (Tiotropium bromide monohydrate) .... One puff once daily into the lungs 7)  Valium 5 Mg Tabs (Diazepam) .... One every 6hours as needed 8)  Hydromorphone Hcl 2 Mg Tabs (Hydromorphone hcl) .Marland Kitchen.. 1-2 by mouth qid as needed for pain  Patient Instructions: 1)  Please schedule a follow-up appointment in 2 weeks. 2)  Tobacco is very bad for your health and your loved ones! You Should stop smoking!. 3)  Stop Smoking Tips: Choose a Quit date. Cut down before the Quit date. decide what you will do as a substitute when you feel the urge to smoke(gum,toothpick,exercise). 4)  Check your Blood Pressure regularly. If it is above 140/90: you should make an appointment. 5)  Take 650-1000mg  of Tylenol every 4-6 hours as needed for relief of pain or comfort of fever AVOID taking more than 4000mg   in a 24 hour period (can cause liver damage in higher doses). 6)  Most patients (90%) with low back pain will improve with time (2-6 weeks). Keep active but avoid activities that are painful. Apply moist heat and/or ice to lower back several times a day. Prescriptions: HYDROMORPHONE HCL 2 MG TABS (HYDROMORPHONE HCL) 1-2 by mouth QID as needed for pain  #35 x 0   Entered and Authorized by:   Etta Grandchild MD   Signed by:   Etta Grandchild MD on 03/08/2010   Method used:   Print then Give to Patient   RxID:   [id number]    Orders Added: 1)  T-Lumbar Spine Complete, 5 Views [71110TC] 2)  Est. Patient Level IV [56213]

## 2010-04-27 NOTE — Assessment & Plan Note (Signed)
Summary: cough/#/cd   Vital Signs:  Patient profile:   65 year old female Menstrual status:  hysterectomy Height:      62 inches Weight:      154 pounds BMI:     28.27 O2 Sat:      98 % on Room air Temp:     98.8 degrees F oral Pulse rate:   92 / minute Pulse rhythm:   regular Resp:     16 per minute BP sitting:   140 / 90  (left arm) Cuff size:   large  Vitals Entered By: Rock Nephew CMA (April 14, 2010 11:21 AM)  Nutrition Counseling: Patient's BMI is greater than 25 and therefore counseled on weight management options.  O2 Flow:  Room air  Primary Care Provider:  Etta Grandchild MD  CC:  Cough.  History of Present Illness:  Cough      This is a 65 year old woman who presents with Cough.  The symptoms began 4-6 months ago.  The intensity is described as mild.  The patient reports non-productive cough, but denies productive cough, pleuritic chest pain, shortness of breath, wheezing, exertional dyspnea, fever, hemoptysis, and malaise.  Associated symtpoms include nasal congestion and chronic rhinitis.  The patient denies the following symptoms: cold/URI symptoms, sore throat, weight loss, acid reflux symptoms, and peripheral edema.  The cough is worse with cold exposure.  Ineffective prior treatments have included OTC cough medication, throat lozenges, PPI, and other asthma medication.  Risk factors include history of allergic rhinitis.  Diagnostic testing to date has included CXR.    She also complains of persistent low back pain that radiates into her LLE and she wants to get an MRI done.  Preventive Screening-Counseling & Management  Alcohol-Tobacco     Alcohol drinks/day: 1     Alcohol type: beer     >5/day in last 3 mos: no     Alcohol Counseling: not indicated; use of alcohol is not excessive or problematic     Feels need to cut down: no     Feels annoyed by complaints: no     Feels guilty re: drinking: no     Needs 'eye opener' in am: no     Smoking Status:  quit     Smoking Cessation Counseling: yes     Smoke Cessation Stage: quit     Packs/Day: 2.0     Year Started: 1965     Year Quit: 2001     Pack years: 40     Tobacco Counseling: to remain off tobacco products  Hep-HIV-STD-Contraception     Hepatitis Risk: no risk noted     HIV Risk: no risk noted     STD Risk: no risk noted      Sexual History:  currently monogamous.        Drug Use:  no.        Blood Transfusions:  no.    Current Medications (verified): 1)  Prilosec 20 Mg Cpdr (Omeprazole) .Marland Kitchen.. 1 Tab Once Daily 2)  Metoprolol Succinate 25 Mg Tab (Metoprolol Succinate) .Marland Kitchen.. 1 Tab Two Times A Day 3)  Sulindac 200 Mg Tabs (Sulindac) .Marland Kitchen.. 1 Tab Two Times A Day 4)  Tekturna Hct 150-25 Mg Tabs (Aliskiren-Hydrochlorothiazide) .... One By Mouth Each Day For High Blood Pressure 5)  Spiriva Handihaler 18 Mcg Caps (Tiotropium Bromide Monohydrate) .... One Puff Once Daily Into The Lungs 6)  Hydromorphone Hcl 2 Mg Tabs (Hydromorphone Hcl) .Marland Kitchen.. 1-2 By  Mouth Qid As Needed For Pain 7)  Benzonatate 200 Mg Caps (Benzonatate) .... One By Mouth Three Times A Day As Needed For Cough 8)  Fexofenadine Hcl 180 Mg Tabs (Fexofenadine Hcl) .... One By Mouth Once Daily For Nasal Allergies  Allergies (verified): 1)  ! Ibuprofen  Past History:  Past Medical History: Last updated: 06/10/2009 COPD GERD Hypertension Osteoarthritis  Past Surgical History: Last updated: 06/10/2009 Hysterectomy  Family History: Last updated: 06/10/2009 Family History Hypertension  Social History: Last updated: 06/10/2009 Occupation: school bus driver [employer] Alcohol use-yes Drug use-no Regular exercise-no  Risk Factors: Alcohol Use: 1 (04/14/2010) >5 drinks/d w/in last 3 months: no (04/14/2010) Exercise: no (06/10/2009)  Risk Factors: Smoking Status: quit (04/14/2010) Packs/Day: 2.0 (04/14/2010)  Family History: Reviewed history from 06/10/2009 and no changes required. Family History  Hypertension  Social History: Reviewed history from 06/10/2009 and no changes required. Occupation: school bus Acupuncturist Alcohol use-yes Drug use-no Regular exercise-no  Review of Systems  The patient denies anorexia, fever, weight loss, weight gain, decreased hearing, hoarseness, chest pain, syncope, dyspnea on exertion, peripheral edema, headaches, hemoptysis, abdominal pain, hematuria, suspicious skin lesions, transient blindness, enlarged lymph nodes, and angioedema.   General:  Denies chills, fatigue, fever, loss of appetite, malaise, sleep disorder, sweats, and weakness. ENT:  Complains of nasal congestion and postnasal drainage; denies decreased hearing, difficulty swallowing, ear discharge, earache, hoarseness, nosebleeds, ringing in ears, sinus pressure, and sore throat. MS:  Complains of low back pain; denies joint pain, joint redness, joint swelling, loss of strength, muscle aches, muscle, cramps, and muscle weakness. Neuro:  Denies brief paralysis, difficulty with concentration, disturbances in coordination, falling down, headaches, numbness, poor balance, tingling, tremors, visual disturbances, and weakness.  Physical Exam  General:  Well-developed, well-nourished, in no acute distress; alert and oriented x 3.   Head:  normocephalic, atraumatic, no abnormalities observed, and no abnormalities palpated.   Eyes:  vision grossly intact, pupils equal, and no injection.   Ears:  R ear normal and L ear normal.   Nose:  clear nasal discharge and mucosal pallor and mucosal edema.  no airflow obstruction, no intranasal foreign body, no nasal polyps, no nasal mucosal lesions, no mucosal friability, no active bleeding or clots, no sinus percussion tenderness, no septum abnormalities, nasal dischargemucosal pallor, and mucosal edema.   Mouth:  good dentition, pharynx pink and moist, no erythema, no exudates, no posterior lymphoid hypertrophy, no pharyngeal crowing, no lesions, no  aphthous ulcers, no erosions, no tongue abnormalities, no leukoplakia, and no petechiae.   Neck:  No deformities, masses, or tenderness noted. Lungs:  Normal respiratory effort, chest expands symmetrically. Lungs are clear to auscultation, no crackles or wheezes. Heart:  Normal rate and regular rhythm. S1 and S2 normal without gallop, murmur, click, rub or other extra sounds. Abdomen:  soft, non-tender, normal bowel sounds, no distention, no masses, no guarding, no rigidity, no rebound tenderness, no hepatomegaly, and no splenomegaly.   Msk:  normal ROM, no joint tenderness, no joint swelling, no joint warmth, no redness over joints, no joint deformities, no joint instability, no crepitation, and no muscle atrophy.   Pulses:  R and L carotid,radial,femoral,dorsalis pedis and posterior tibial pulses are full and equal bilaterally Extremities:  No clubbing, cyanosis, edema, or deformity noted with normal full range of motion of all joints.   Neurologic:  No cranial nerve deficits noted. Station and gait are normal. Plantar reflexes are down-going bilaterally. DTRs are symmetrical throughout. Sensory, motor and coordinative functions appear intact.  Skin:  Intact without suspicious lesions or rashes Cervical Nodes:  no anterior cervical adenopathy and no posterior cervical adenopathy.   Axillary Nodes:  no R axillary adenopathy and no L axillary adenopathy.   Psych:  Cognition and judgment appear intact. Alert and cooperative with normal attention span and concentration. No apparent delusions, illusions, hallucinations   Detailed Back/Spine Exam  General:    Well-developed, well-nourished, in no acute distress; alert and oriented x 3.    Gait:    Normal heel-toe gait pattern bilaterally.    Lumbosacral Exam:  Inspection-deformity:    Normal Palpation-spinal tenderness:  Normal Range of Motion:    Forward Flexion:   80 degrees    Hyperextension:   25 degrees    Right Lateral Bend:   30  degrees    Left Lateral Bend:   30 degrees Lying Straight Leg Raise:    Right:  negative    Left:  negative Sitting Straight Leg Raise:    Right:  negative    Left:  negative Reverse Straight Leg Raise:    Right:  negative    Left:  negative Contralateral Straight Leg Raise:    Right:  negative    Left:  negative Sciatic Notch:    There is no sciatic notch tenderness. Toe Walking:    Right:  normal    Left:  normal Heel Walking:    Right:  normal    Left:  normal   Impression & Recommendations:  Problem # 1:  LUMBAR RADICULOPATHY, LEFT (ICD-724.4) Assessment Unchanged  The following medications were removed from the medication list:    Tramadol Hcl 50 Mg Tabs (Tramadol hcl) .Marland Kitchen... 1-2 by mouth qid as needed for pain Her updated medication list for this problem includes:    Sulindac 200 Mg Tabs (Sulindac) .Marland Kitchen... 1 tab two times a day    Hydromorphone Hcl 2 Mg Tabs (Hydromorphone hcl) .Marland Kitchen... 1-2 by mouth qid as needed for pain  Orders: Radiology Referral (Radiology)  Discussed use of moist heat or ice, modified activities, medications, and stretching/strengthening exercises. Back care instructions given. To be seen in 2 weeks if no improvement; sooner if worsening of symptoms.   Problem # 2:  COPD (ICD-496) Assessment: Unchanged  Her updated medication list for this problem includes:    Spiriva Handihaler 18 Mcg Caps (Tiotropium bromide monohydrate) ..... One puff once daily into the lungs  Problem # 3:  ALLERGIC RHINITIS DUE TO OTHER ALLERGEN (ICD-477.8) Assessment: New start allergra, try tessalon for the cough  Problem # 4:  LOW BACK PAIN, ACUTE (ICD-724.2) Assessment: Unchanged  The following medications were removed from the medication list:    Tramadol Hcl 50 Mg Tabs (Tramadol hcl) .Marland Kitchen... 1-2 by mouth qid as needed for pain Her updated medication list for this problem includes:    Sulindac 200 Mg Tabs (Sulindac) .Marland Kitchen... 1 tab two times a day    Hydromorphone Hcl  2 Mg Tabs (Hydromorphone hcl) .Marland Kitchen... 1-2 by mouth qid as needed for pain  Orders: Radiology Referral (Radiology)  Discussed use of moist heat or ice, modified activities, medications, and stretching/strengthening exercises. Back care instructions given. To be seen in 2 weeks if no improvement; sooner if worsening of symptoms.   Problem # 5:  HYPERTENSION (ICD-401.9) Assessment: Unchanged  Her updated medication list for this problem includes:    Tekturna Hct 150-25 Mg Tabs (Aliskiren-hydrochlorothiazide) ..... One by mouth each day for high blood pressure  BP today: 140/90 Prior BP: 130/94 (03/08/2010)  Prior 10 Yr Risk Heart Disease: Not enough information (06/10/2009)  Labs Reviewed: K+: 3.7 (06/10/2009) Creat: : 0.9 (06/10/2009)   Chol: 205 (06/10/2009)   HDL: 42.70 (06/10/2009)   TG: 178.0 (06/10/2009)  Problem # 6:  COUGH (ICD-786.2) Assessment: New  Complete Medication List: 1)  Prilosec 20 Mg Cpdr (Omeprazole) .Marland Kitchen.. 1 tab once daily 2)  Metoprolol Succinate 25 Mg Tab (metoprolol Succinate)  .Marland Kitchen.. 1 tab two times a day 3)  Sulindac 200 Mg Tabs (Sulindac) .Marland Kitchen.. 1 tab two times a day 4)  Tekturna Hct 150-25 Mg Tabs (Aliskiren-hydrochlorothiazide) .... One by mouth each day for high blood pressure 5)  Spiriva Handihaler 18 Mcg Caps (Tiotropium bromide monohydrate) .... One puff once daily into the lungs 6)  Hydromorphone Hcl 2 Mg Tabs (Hydromorphone hcl) .Marland Kitchen.. 1-2 by mouth qid as needed for pain 7)  Benzonatate 200 Mg Caps (Benzonatate) .... One by mouth three times a day as needed for cough 8)  Fexofenadine Hcl 180 Mg Tabs (Fexofenadine hcl) .... One by mouth once daily for nasal allergies  Patient Instructions: 1)  Please schedule a follow-up appointment in 2 months. 2)  It is important that you exercise regularly at least 20 minutes 5 times a week. If you develop chest pain, have severe difficulty breathing, or feel very tired , stop exercising immediately and seek medical  attention. 3)  You need to lose weight. Consider a lower calorie diet and regular exercise.  4)  Check your Blood Pressure regularly. If it is above 140/90: you should make an appointment. 5)  Take 650-1000mg  of Tylenol every 4-6 hours as needed for relief of pain or comfort of fever AVOID taking more than 4000mg   in a 24 hour period (can cause liver damage in higher doses). 6)  Most patients (90%) with low back pain will improve with time (2-6 weeks). Keep active but avoid activities that are painful. Apply moist heat and/or ice to lower back several times a day. Prescriptions: FEXOFENADINE HCL 180 MG TABS (FEXOFENADINE HCL) One by mouth once daily for nasal allergies  #30 x 11   Entered and Authorized by:   Etta Grandchild MD   Signed by:   Etta Grandchild MD on 04/14/2010   Method used:   Print then Give to Patient   RxID:   [id number] BENZONATATE 200 MG CAPS (BENZONATATE) One by mouth three times a day as needed for cough  #90 x 11   Entered and Authorized by:   Etta Grandchild MD   Signed by:   Etta Grandchild MD on 04/14/2010   Method used:   Print then Give to Patient   RxID:   [id number] HYDROMORPHONE HCL 2 MG TABS (HYDROMORPHONE HCL) 1-2 by mouth QID as needed for pain  #35 x 0   Entered and Authorized by:   Etta Grandchild MD   Signed by:   Etta Grandchild MD on 04/14/2010   Method used:   Print then Give to Patient   RxID:   [id number]    Orders Added: 1)  Radiology Referral [Radiology] 2)  Est. Patient Level V [06269]

## 2010-04-27 NOTE — Letter (Signed)
Summary: Results Follow-up Letter  [facility]  [address] [place], [place] [address]   Phone: [phone]  Fax: [phone]    04/19/2010  [address] [place], [place]  [address]  Dear Kelli Reyes,   The following are the results of your recent test(s):  Test     Result     MRI       swelling in the sacrum - another MRI has been ordered to see if you have a "stress fracture"       in the low back there is severe arthritis and spinal stenosis   _________________________________________________________  Please call for an appointment soon _________________________________________________________ _________________________________________________________ _________________________________________________________  Sincerely,  Sanda Linger MD [facility]

## 2010-04-27 NOTE — Progress Notes (Signed)
     New Problems: PELVIC PAIN, CHRONIC (ICD-789.09)   New Problems: PELVIC PAIN, CHRONIC (ICD-789.09)

## 2010-04-27 NOTE — Progress Notes (Signed)
Summary: REFILL   Phone Note Call from Patient Call back at [facility] Phone [phone]   Summary of Call: Patient is requesting refill of tussionex.  Initial call taken by: Lamar Sprinkles, CMA,  April 13, 2010 9:54 AM  Follow-up for Phone Call        no, it is time for her to stop taking this Follow-up by: Etta Grandchild MD,  April 13, 2010 10:09 AM  Additional Follow-up for Phone Call Additional follow up Details #1::        called patient informed of above information. Patient requested to schedule OV with Dr. Yetta Barre 04/14/2010. Transfered to scheduler to do so. Additional Follow-up by: Robin Ewing CMA Duncan Dull),  April 13, 2010 11:27 AM

## 2010-04-27 NOTE — Letter (Signed)
Summary: Out of Work  [facility]  [address] [place], [place] [address]   Phone: [phone]  Fax: [phone]    March 08, 2010   Employee:  Shaolin Verba    To Whom It May Concern:   For Medical reasons, please excuse the above named employee from work for the following dates:  Start:   03/05/2010  End:   03/20/2010  If you need additional information, please feel free to contact our office.         Sincerely,    Etta Grandchild MD

## 2010-05-01 ENCOUNTER — Other Ambulatory Visit (HOSPITAL_COMMUNITY): Payer: Self-pay

## 2010-05-05 ENCOUNTER — Ambulatory Visit (HOSPITAL_COMMUNITY)
Admission: RE | Admit: 2010-05-05 | Discharge: 2010-05-05 | Disposition: A | Discharge status: Home / Self Care | Payer: BC Managed Care – PPO | Source: Ambulatory Visit | Attending: Internal Medicine | Admitting: Internal Medicine

## 2010-05-05 ENCOUNTER — Telehealth: Payer: Self-pay | Admitting: Internal Medicine

## 2010-05-05 ENCOUNTER — Encounter: Payer: Self-pay | Admitting: Internal Medicine

## 2010-05-05 ENCOUNTER — Other Ambulatory Visit: Payer: Self-pay | Admitting: Internal Medicine

## 2010-05-05 DIAGNOSIS — R102 Pelvic and perineal pain: Secondary | ICD-10-CM

## 2010-05-05 DIAGNOSIS — M899 Disorder of bone, unspecified: Secondary | ICD-10-CM | POA: Insufficient documentation

## 2010-05-05 DIAGNOSIS — M25559 Pain in unspecified hip: Secondary | ICD-10-CM | POA: Insufficient documentation

## 2010-05-05 LAB — CREATININE, SERUM
Creatinine, Ser: 1.05 mg/dL (ref 0.4–1.2)
GFR calc Af Amer: >60 mL/min (ref >60)
GFR calc non Af Amer: 53 mL/min — ABNORMAL LOW (ref >60)

## 2010-05-05 MED ORDER — GADOBENATE DIMEGLUMINE 529 MG/ML IV SOLN: 15.0000 mL | Freq: Once | INTRAVENOUS | Status: AC | PRN

## 2010-05-11 NOTE — Progress Notes (Signed)
     Follow-up for Phone Call       Follow-up by: Etta Grandchild MD,  May 05, 2010 4:09 PM

## 2010-05-22 ENCOUNTER — Other Ambulatory Visit: Payer: Self-pay | Admitting: Oncology

## 2010-05-22 ENCOUNTER — Encounter (HOSPITAL_BASED_OUTPATIENT_CLINIC_OR_DEPARTMENT_OTHER): Payer: BC Managed Care – PPO | Admitting: Oncology

## 2010-05-22 DIAGNOSIS — C9000 Multiple myeloma not having achieved remission: Secondary | ICD-10-CM

## 2010-05-22 LAB — CBC WITH DIFFERENTIAL/PLATELET
Basophils Absolute: 0 10*3/uL (ref 0.0–0.1)
Eosinophils Absolute: 0.3 10*3/uL (ref 0.0–0.5)
HGB: 14.1 g/dL (ref 11.6–15.9)
MCV: 90.6 fL (ref 79.5–101.0)
MONO#: 0.6 10*3/uL (ref 0.1–0.9)
MONO%: 10.7 % (ref 0.0–14.0)
NEUT#: 3.4 10*3/uL (ref 1.5–6.5)
RBC: 4.6 10*6/uL (ref 3.70–5.45)
RDW: 12.9 % (ref 11.2–14.5)
WBC: 5.3 10*3/uL (ref 3.9–10.3)
lymph#: 1 10*3/uL (ref 0.9–3.3)

## 2010-05-24 ENCOUNTER — Telehealth: Payer: Self-pay | Admitting: Internal Medicine

## 2010-05-24 LAB — KAPPA/LAMBDA LIGHT CHAINS
Kappa free light chain: 1.58 mg/dL (ref 0.33–1.94)
Lambda Free Lght Chn: 2.58 mg/dL (ref 0.57–2.63)

## 2010-05-24 LAB — CEA: CEA: 1.3 ng/mL (ref 0.0–5.0)

## 2010-05-24 LAB — COMPREHENSIVE METABOLIC PANEL
Albumin: 4.5 g/dL (ref 3.5–5.2)
Alkaline Phosphatase: 143 U/L — ABNORMAL HIGH (ref 39–117)
Calcium: 9.7 mg/dL (ref 8.4–10.5)
Chloride: 102 mEq/L (ref 96–112)
Glucose, Bld: 95 mg/dL (ref 70–99)
Potassium: 4 mEq/L (ref 3.5–5.3)
Sodium: 138 mEq/L (ref 135–145)
Total Protein: 8.3 g/dL (ref 6.0–8.3)

## 2010-05-24 LAB — PROTEIN ELECTROPHORESIS, SERUM: Total Protein, Serum Electrophoresis: 8.3 g/dL (ref 6.0–8.3)

## 2010-05-24 LAB — CA 125: CA 125: 20.1 U/mL (ref 0.0–30.2)

## 2010-05-25 ENCOUNTER — Other Ambulatory Visit: Payer: Self-pay | Admitting: Oncology

## 2010-05-25 ENCOUNTER — Encounter: Payer: Self-pay | Admitting: Internal Medicine

## 2010-05-25 ENCOUNTER — Encounter: Payer: BC Managed Care – PPO | Admitting: Oncology

## 2010-05-25 DIAGNOSIS — C414 Malignant neoplasm of pelvic bones, sacrum and coccyx: Secondary | ICD-10-CM

## 2010-05-25 DIAGNOSIS — M899 Disorder of bone, unspecified: Secondary | ICD-10-CM

## 2010-05-29 ENCOUNTER — Emergency Department (HOSPITAL_COMMUNITY)
Admission: EM | Admit: 2010-05-29 | Discharge: 2010-05-29 | Disposition: A | Discharge status: Home / Self Care | Payer: BC Managed Care – PPO | Attending: Emergency Medicine | Admitting: Emergency Medicine

## 2010-05-29 DIAGNOSIS — R197 Diarrhea, unspecified: Secondary | ICD-10-CM | POA: Insufficient documentation

## 2010-05-30 ENCOUNTER — Other Ambulatory Visit: Payer: Self-pay | Admitting: Oncology

## 2010-05-30 DIAGNOSIS — M533 Sacrococcygeal disorders, not elsewhere classified: Secondary | ICD-10-CM

## 2010-06-01 NOTE — Progress Notes (Signed)
Summary: RF  Phone Note Refill Request Call back at Home Phone [phone]   Refills Requested: Medication #1:  HYDROMORPHONE HCL 2 MG TABS 1-2 by mouth QID as needed for pain Req to pick up rx tomorrow  Initial call taken by: Lamar Sprinkles, CMA,  May 24, 2010 10:53 AM  Follow-up for Phone Call        left detailed vm for pt on hm # rx ready for pick up Follow-up by: Lamar Sprinkles, CMA,  May 24, 2010 6:46 PM    Prescriptions: HYDROMORPHONE HCL 2 MG TABS (HYDROMORPHONE HCL) 1-2 by mouth QID as needed for pain  #35 x 0   Entered and Authorized by:   Etta Grandchild MD   Signed by:   Etta Grandchild MD on 05/24/2010   Method used:   Print then Give to Patient   RxID:   [id number]

## 2010-06-05 ENCOUNTER — Ambulatory Visit (HOSPITAL_COMMUNITY): Payer: BC Managed Care – PPO

## 2010-06-05 ENCOUNTER — Ambulatory Visit (HOSPITAL_COMMUNITY)
Admission: RE | Admit: 2010-06-05 | Discharge: 2010-06-05 | Disposition: A | Discharge status: Home / Self Care | Payer: BC Managed Care – PPO | Source: Ambulatory Visit | Attending: Oncology | Admitting: Oncology

## 2010-06-05 ENCOUNTER — Other Ambulatory Visit: Payer: Self-pay | Admitting: Interventional Radiology

## 2010-06-05 DIAGNOSIS — J329 Chronic sinusitis, unspecified: Secondary | ICD-10-CM | POA: Insufficient documentation

## 2010-06-05 DIAGNOSIS — K219 Gastro-esophageal reflux disease without esophagitis: Secondary | ICD-10-CM | POA: Insufficient documentation

## 2010-06-05 DIAGNOSIS — M533 Sacrococcygeal disorders, not elsewhere classified: Secondary | ICD-10-CM

## 2010-06-05 DIAGNOSIS — M1990 Unspecified osteoarthritis, unspecified site: Secondary | ICD-10-CM | POA: Insufficient documentation

## 2010-06-05 DIAGNOSIS — J4489 Other specified chronic obstructive pulmonary disease: Secondary | ICD-10-CM | POA: Insufficient documentation

## 2010-06-05 DIAGNOSIS — J449 Chronic obstructive pulmonary disease, unspecified: Secondary | ICD-10-CM | POA: Insufficient documentation

## 2010-06-05 DIAGNOSIS — M899 Disorder of bone, unspecified: Secondary | ICD-10-CM | POA: Insufficient documentation

## 2010-06-05 DIAGNOSIS — I10 Essential (primary) hypertension: Secondary | ICD-10-CM | POA: Insufficient documentation

## 2010-06-05 LAB — CBC
MCH: 29.8 pg (ref 26.0–34.0)
MCHC: 33.2 g/dL (ref 30.0–36.0)
RDW: 12.7 % (ref 11.5–15.5)

## 2010-06-06 ENCOUNTER — Other Ambulatory Visit (HOSPITAL_COMMUNITY): Payer: BC Managed Care – PPO

## 2010-06-13 NOTE — Letter (Signed)
Summary: [facility]  [facility]   Imported By: Sherian Rein 06/08/2010 08:47:28  _____________________________________________________________________  External Attachment:    Type:   Image     Comment:   External Document

## 2010-06-14 ENCOUNTER — Other Ambulatory Visit (HOSPITAL_COMMUNITY): Payer: BC Managed Care – PPO

## 2010-06-15 ENCOUNTER — Ambulatory Visit: Payer: Self-pay | Admitting: Internal Medicine

## 2010-06-19 ENCOUNTER — Ambulatory Visit (HOSPITAL_COMMUNITY)
Admission: RE | Admit: 2010-06-19 | Discharge: 2010-06-19 | Disposition: A | Discharge status: Home / Self Care | Payer: BC Managed Care – PPO | Source: Ambulatory Visit | Attending: Oncology | Admitting: Oncology

## 2010-06-19 DIAGNOSIS — M899 Disorder of bone, unspecified: Secondary | ICD-10-CM

## 2010-06-19 DIAGNOSIS — R911 Solitary pulmonary nodule: Secondary | ICD-10-CM | POA: Insufficient documentation

## 2010-06-19 DIAGNOSIS — C414 Malignant neoplasm of pelvic bones, sacrum and coccyx: Secondary | ICD-10-CM | POA: Insufficient documentation

## 2010-06-19 DIAGNOSIS — K5730 Diverticulosis of large intestine without perforation or abscess without bleeding: Secondary | ICD-10-CM | POA: Insufficient documentation

## 2010-06-19 DIAGNOSIS — I889 Nonspecific lymphadenitis, unspecified: Secondary | ICD-10-CM | POA: Insufficient documentation

## 2010-06-19 MED ORDER — IOHEXOL 300 MG/ML  SOLN
100.0000 mL | Freq: Once | INTRAMUSCULAR | Status: AC | PRN
  Administered 2010-06-19: 100 mL via INTRAVENOUS

## 2010-06-23 ENCOUNTER — Encounter (HOSPITAL_BASED_OUTPATIENT_CLINIC_OR_DEPARTMENT_OTHER): Payer: BC Managed Care – PPO | Admitting: Oncology

## 2010-06-23 ENCOUNTER — Other Ambulatory Visit: Payer: Self-pay | Admitting: Oncology

## 2010-06-23 DIAGNOSIS — C414 Malignant neoplasm of pelvic bones, sacrum and coccyx: Secondary | ICD-10-CM

## 2010-06-23 DIAGNOSIS — R911 Solitary pulmonary nodule: Secondary | ICD-10-CM

## 2010-08-18 ENCOUNTER — Telehealth: Payer: Self-pay | Admitting: *Deleted

## 2010-08-18 MED ORDER — HYDROMORPHONE HCL 2 MG PO TABS: 2.0000 mg | ORAL_TABLET | Freq: Four times a day (QID) | ORAL | Status: DC | PRN

## 2010-08-18 NOTE — Telephone Encounter (Signed)
Pt aware to pick up RX °

## 2010-08-18 NOTE — Telephone Encounter (Signed)
o for refill, listed as a chronic med

## 2010-08-18 NOTE — Telephone Encounter (Signed)
Patient requesting RF of dilaudid.

## 2010-08-18 NOTE — Telephone Encounter (Signed)
READY FOR PICK UP

## 2010-08-18 NOTE — Telephone Encounter (Signed)
Pending signature

## 2010-08-18 NOTE — Telephone Encounter (Signed)
Called pt..Left mess for patient to call back to inform pt Rx is upfront in folder.

## 2010-09-04 ENCOUNTER — Ambulatory Visit (INDEPENDENT_AMBULATORY_CARE_PROVIDER_SITE_OTHER): Payer: BC Managed Care – PPO | Admitting: Internal Medicine

## 2010-09-04 ENCOUNTER — Encounter: Payer: Self-pay | Admitting: Internal Medicine

## 2010-09-04 DIAGNOSIS — Z1231 Encounter for screening mammogram for malignant neoplasm of breast: Secondary | ICD-10-CM

## 2010-09-04 DIAGNOSIS — R109 Unspecified abdominal pain: Secondary | ICD-10-CM

## 2010-09-04 DIAGNOSIS — I10 Essential (primary) hypertension: Secondary | ICD-10-CM

## 2010-09-04 DIAGNOSIS — M545 Low back pain: Secondary | ICD-10-CM

## 2010-09-04 DIAGNOSIS — J449 Chronic obstructive pulmonary disease, unspecified: Secondary | ICD-10-CM

## 2010-09-04 DIAGNOSIS — D472 Monoclonal gammopathy: Secondary | ICD-10-CM

## 2010-09-04 MED ORDER — OLMESARTAN MEDOXOMIL-HCTZ 40-25 MG PO TABS: 1.0000 | ORAL_TABLET | Freq: Every day | ORAL | Status: DC

## 2010-09-04 MED ORDER — HYDROMORPHONE HCL 2 MG PO TABS: 2.0000 mg | ORAL_TABLET | Freq: Four times a day (QID) | ORAL | Status: DC | PRN

## 2010-09-04 NOTE — Assessment & Plan Note (Signed)
She has been evaluated by Dr. Gaylyn Rong and so far has been told that she does not have cancer

## 2010-09-04 NOTE — Progress Notes (Signed)
Addended by: Etta Grandchild on: 09/04/2010 03:39 PM   Modules accepted: Orders

## 2010-09-04 NOTE — Assessment & Plan Note (Signed)
Continue current meds for pain 

## 2010-09-04 NOTE — Assessment & Plan Note (Signed)
Her BP is well controlled 

## 2010-09-04 NOTE — Progress Notes (Signed)
Subjective:    Patient ID: Kelli Reyes, female    DOB: December 16, 1945, 65 y.o.   MRN: [id number]  Hypertension This is a chronic problem. The current episode started more than 1 year ago. The problem has been gradually improving since onset. The problem is controlled. Pertinent negatives include no anxiety, blurred vision, chest pain, headaches, malaise/fatigue, neck pain, orthopnea, palpitations, peripheral edema, PND, shortness of breath or sweats. Past treatments include angiotensin blockers and diuretics. The current treatment provides significant improvement. Compliance problems include medication cost.   Back Pain This is a chronic problem. The current episode started more than 1 year ago. The problem occurs intermittently. The problem is unchanged. The pain is present in the lumbar spine. The quality of the pain is described as aching. The pain does not radiate. The pain is at a severity of 6/10. The pain is moderate. The pain is worse during the day. The symptoms are aggravated by bending. Stiffness is present at night. Pertinent negatives include no abdominal pain, bladder incontinence, bowel incontinence, chest pain, dysuria, fever, headaches, leg pain, numbness, paresis, paresthesias, pelvic pain, perianal numbness, tingling, weakness or weight loss. She has tried analgesics for the symptoms. The treatment provided significant relief.      Review of Systems  Constitutional: Negative for fever, chills, weight loss, malaise/fatigue, diaphoresis, activity change, appetite change, fatigue and unexpected weight change.  HENT: Negative for sore throat, facial swelling, neck pain and voice change.   Eyes: Negative for blurred vision, photophobia and visual disturbance.  Respiratory: Negative for apnea, cough, choking, chest tightness, shortness of breath, wheezing and stridor.   Cardiovascular: Negative for chest pain, palpitations, orthopnea, leg swelling and PND.  Gastrointestinal: Negative for  nausea, vomiting, abdominal pain, diarrhea, constipation, blood in stool, abdominal distention, anal bleeding and bowel incontinence.  Genitourinary: Positive for urgency. Negative for bladder incontinence, dysuria, frequency, hematuria, flank pain, enuresis, difficulty urinating, pelvic pain and dyspareunia.  Musculoskeletal: Positive for back pain. Negative for myalgias, joint swelling and arthralgias.  Skin: Negative for color change, pallor and rash.  Neurological: Negative for dizziness, tingling, tremors, seizures, syncope, facial asymmetry, speech difficulty, weakness, light-headedness, numbness, headaches and paresthesias.  Hematological: Negative for adenopathy. Does not bruise/bleed easily.       Objective:   Physical Exam  [vitalsreviewed. Constitutional: She is oriented to person, place, and time. She appears well-developed and well-nourished. No distress.  HENT:  Head: Normocephalic and atraumatic.  Right Ear: External ear normal.  Left Ear: External ear normal.  Nose: Nose normal.  Mouth/Throat: Oropharynx is clear and moist. No oropharyngeal exudate.  Eyes: Conjunctivae and EOM are normal. Pupils are equal, round, and reactive to light. Right eye exhibits no discharge. Left eye exhibits no discharge. No scleral icterus.  Neck: Normal range of motion. Neck supple. No JVD present. No tracheal deviation present. No thyromegaly present.  Cardiovascular: Normal rate, regular rhythm, normal heart sounds and intact distal pulses.  Exam reveals no gallop and no friction rub.   No murmur heard. Pulmonary/Chest: Effort normal and breath sounds normal. No stridor. No respiratory distress. She has no wheezes. She has no rales. She exhibits no tenderness.  Abdominal: Soft. Bowel sounds are normal. She exhibits no distension and no mass. There is no tenderness. There is no rebound and no guarding.  Musculoskeletal: Normal range of motion. She exhibits no edema and no tenderness.    Lymphadenopathy:    She has no cervical adenopathy.  Neurological: She is alert and oriented to person, place, and time.  She has normal reflexes. She displays normal reflexes. No cranial nerve deficit. She exhibits normal muscle tone. Coordination normal.  Skin: Skin is warm and dry. No rash noted. She is not diaphoretic. No erythema. No pallor.  Psychiatric: She has a normal mood and affect. Her behavior is normal. Judgment and thought content normal.        Lab Results  Component Value Date   WBC 6.2 06/05/2010   HGB 13.4 06/05/2010   HCT 40.4 06/05/2010   PLT 229 06/05/2010   CHOL 205* 06/10/2009   TRIG 178.0* 06/10/2009   HDL 42.70 06/10/2009   LDLDIRECT 144.9 06/10/2009   ALT 23 05/22/2010   ALT 23 05/22/2010   ALT 23 05/22/2010   ALT 23 05/22/2010   AST 23 05/22/2010   AST 23 05/22/2010   AST 23 05/22/2010   AST 23 05/22/2010   NA 138 05/22/2010   NA 138 05/22/2010   NA 138 05/22/2010   NA 138 05/22/2010   K 4.0 05/22/2010   K 4.0 05/22/2010   K 4.0 05/22/2010   K 4.0 05/22/2010   CL 102 05/22/2010   CL 102 05/22/2010   CL 102 05/22/2010   CL 102 05/22/2010   CREATININE 1.06 05/22/2010   CREATININE 1.06 05/22/2010   CREATININE 1.06 05/22/2010   CREATININE 1.06 05/22/2010   BUN 23 05/22/2010   BUN 23 05/22/2010   BUN 23 05/22/2010   BUN 23 05/22/2010   CO2 24 05/22/2010   CO2 24 05/22/2010   CO2 24 05/22/2010   CO2 24 05/22/2010   TSH 1.12 06/10/2009   INR 0.97 06/05/2010    Assessment & Plan:

## 2010-09-04 NOTE — Assessment & Plan Note (Signed)
This is stable 

## 2010-09-04 NOTE — Patient Instructions (Signed)
Back Pain (Lumbosacral Strain) Back pain is one of the most common causes of pain. There are many causes of back pain. Most are not serious conditions.  CAUSES Your backbone (spinal column) is made up of 24 main vertebral bodies, the sacrum, and the coccyx. These are held together by muscles and tough, fibrous tissue (ligaments). Nerve roots pass through the openings between the vertebrae. A sudden move or injury to the back may cause injury to, or pressure on, these nerves. This may result in localized back pain or pain movement (radiation) into the buttocks, down the leg, and into the foot. Sharp, shooting pain from the buttock down the back of the leg (sciatica) is frequently associated with a ruptured (herniated) disc. Pain may be caused by muscle spasm alone. Your caregiver can often find the cause of your pain by the details of your symptoms and an exam. In some cases, you may need tests (such as X-rays). Your caregiver will work with you to decide if any tests are needed based on your specific exam. HOME CARE INSTRUCTIONS  Avoid an underactive lifestyle. Active exercise, as directed by your caregiver, is your greatest weapon against back pain.   Avoid hard physical activities (tennis, racquetball, water-skiing) if you are not in proper physical condition for it. This may aggravate and/or create problems.   If you have a back problem, avoid sports requiring sudden body movements. Swimming and walking are generally safer activities.   Maintain good posture.   Avoid becoming overweight (obese).   Use bed rest for only the most extreme, sudden (acute) episode. Your caregiver will help you determine how much bed rest is necessary.   For acute conditions, you may put ice on the injured area.   Put ice in a plastic bag.   Place a towel between your skin and the bag.   Leave the ice on for 20 minutes at a time, every 2 hours, or as needed.   After you are improved and more active, it may  help to apply heat for 30 minutes before activities.  See your caregiver if you are having pain that lasts longer than expected. Your caregiver can advise appropriate exercises and/or therapy if needed. With conditioning, most back problems can be avoided. SEEK IMMEDIATE MEDICAL CARE IF:  You have numbness, tingling, weakness, or problems with the use of your arms or legs.   You experience severe back pain not relieved with medicines.   There is a change in bowel or bladder control.   You have increasing pain in any area of the body, including your belly (abdomen).   You notice shortness of breath, dizziness, or feel faint.   You feel sick to your stomach (nauseous), are throwing up (vomiting), or become sweaty.   You notice discoloration of your toes or legs, or your feet get very cold.   Your back pain is getting worse.   You have an oral temperature above 100.5, not controlled by medicine.  MAKE SURE YOU:   Understand these instructions.   Will watch your condition.   Will get help right away if you are not doing well or get worse.  Document Released: 12/20/2004 Document Re-Released: 06/06/2009 [employer] Patient Information 2011 [employer]. 

## 2010-09-07 ENCOUNTER — Other Ambulatory Visit: Payer: Self-pay | Admitting: Internal Medicine

## 2010-09-20 ENCOUNTER — Ambulatory Visit (HOSPITAL_COMMUNITY)
Admission: RE | Admit: 2010-09-20 | Discharge: 2010-09-20 | Disposition: A | Discharge status: Home / Self Care | Payer: BC Managed Care – PPO | Source: Ambulatory Visit | Attending: Internal Medicine | Admitting: Internal Medicine

## 2010-09-20 DIAGNOSIS — Z1231 Encounter for screening mammogram for malignant neoplasm of breast: Secondary | ICD-10-CM | POA: Insufficient documentation

## 2010-09-21 ENCOUNTER — Other Ambulatory Visit (HOSPITAL_COMMUNITY): Payer: BC Managed Care – PPO

## 2010-11-16 ENCOUNTER — Telehealth: Payer: Self-pay | Admitting: *Deleted

## 2010-11-16 DIAGNOSIS — R109 Unspecified abdominal pain: Secondary | ICD-10-CM

## 2010-11-16 DIAGNOSIS — M545 Low back pain: Secondary | ICD-10-CM

## 2010-11-16 MED ORDER — HYDROMORPHONE HCL 2 MG PO TABS: 2.0000 mg | ORAL_TABLET | Freq: Four times a day (QID) | ORAL | Status: DC | PRN

## 2010-11-16 NOTE — Telephone Encounter (Signed)
Patient informed. 

## 2010-11-16 NOTE — Telephone Encounter (Signed)
done

## 2010-11-16 NOTE — Telephone Encounter (Signed)
Patient requesting RF of Dilaudid.

## 2010-12-11 ENCOUNTER — Ambulatory Visit: Payer: BC Managed Care – PPO | Admitting: Internal Medicine

## 2010-12-20 ENCOUNTER — Ambulatory Visit: Payer: BC Managed Care – PPO | Admitting: Internal Medicine

## 2010-12-20 DIAGNOSIS — Z0289 Encounter for other administrative examinations: Secondary | ICD-10-CM

## 2011-01-11 ENCOUNTER — Telehealth: Payer: Self-pay | Admitting: *Deleted

## 2011-01-11 NOTE — Telephone Encounter (Signed)
Needs to be seen

## 2011-01-11 NOTE — Telephone Encounter (Signed)
Pt calling req Rf on Dilaudid.

## 2011-01-15 NOTE — Telephone Encounter (Signed)
Left detailed mess informing pt of below.  

## 2011-01-19 ENCOUNTER — Encounter: Payer: Self-pay | Admitting: Internal Medicine

## 2011-01-19 ENCOUNTER — Ambulatory Visit (INDEPENDENT_AMBULATORY_CARE_PROVIDER_SITE_OTHER): Payer: BC Managed Care – PPO | Admitting: Internal Medicine

## 2011-01-19 ENCOUNTER — Other Ambulatory Visit (INDEPENDENT_AMBULATORY_CARE_PROVIDER_SITE_OTHER): Payer: BC Managed Care – PPO

## 2011-01-19 DIAGNOSIS — J449 Chronic obstructive pulmonary disease, unspecified: Secondary | ICD-10-CM

## 2011-01-19 DIAGNOSIS — M545 Low back pain: Secondary | ICD-10-CM

## 2011-01-19 DIAGNOSIS — R109 Unspecified abdominal pain: Secondary | ICD-10-CM

## 2011-01-19 DIAGNOSIS — I10 Essential (primary) hypertension: Secondary | ICD-10-CM

## 2011-01-19 DIAGNOSIS — M1990 Unspecified osteoarthritis, unspecified site: Secondary | ICD-10-CM

## 2011-01-19 DIAGNOSIS — D472 Monoclonal gammopathy: Secondary | ICD-10-CM

## 2011-01-19 LAB — CBC WITH DIFFERENTIAL/PLATELET
Basophils Relative: 0.7 % (ref 0.0–3.0)
Eosinophils Absolute: 0.2 10*3/uL (ref 0.0–0.7)
HCT: 40 % (ref 36.0–46.0)
Hemoglobin: 13.4 g/dL (ref 12.0–15.0)
Lymphocytes Relative: 22.5 % (ref 12.0–46.0)
Lymphs Abs: 1.2 10*3/uL (ref 0.7–4.0)
MCHC: 33.6 g/dL (ref 30.0–36.0)
MCV: 91.4 fl (ref 78.0–100.0)
Monocytes Absolute: 0.6 10*3/uL (ref 0.1–1.0)
Neutro Abs: 3.3 10*3/uL (ref 1.4–7.7)
RBC: 4.38 Mil/uL (ref 3.87–5.11)

## 2011-01-19 LAB — COMPREHENSIVE METABOLIC PANEL
AST: 27 U/L (ref 0–37)
BUN: 18 mg/dL (ref 6–23)
Calcium: 9.5 mg/dL (ref 8.4–10.5)
Chloride: 108 mEq/L (ref 96–112)
Creatinine, Ser: 0.9 mg/dL (ref 0.4–1.2)
GFR: 80.02 mL/min (ref >60.00)

## 2011-01-19 MED ORDER — OLMESARTAN MEDOXOMIL-HCTZ 40-25 MG PO TABS: 1.0000 | ORAL_TABLET | Freq: Every day | ORAL | Status: DC

## 2011-01-19 MED ORDER — HYDROMORPHONE HCL 2 MG PO TABS: 2.0000 mg | ORAL_TABLET | Freq: Four times a day (QID) | ORAL | Status: DC | PRN

## 2011-01-19 NOTE — Patient Instructions (Signed)

## 2011-01-19 NOTE — Progress Notes (Signed)
Subjective:    Patient ID: Kelli Reyes, female    DOB: 03/24/1946, 65 y.o.   MRN: [id number]  Back Pain This is a chronic problem. The current episode started more than 1 year ago. The problem occurs intermittently. The problem is unchanged. The pain is present in the lumbar spine and gluteal. The quality of the pain is described as aching. The pain does not radiate. The pain is at a severity of 6/10. The pain is moderate. The pain is worse during the day. The symptoms are aggravated by position. Stiffness is present in the morning. Pertinent negatives include no abdominal pain, bladder incontinence, bowel incontinence, chest pain, dysuria, fever, headaches, leg pain, numbness, paresis, paresthesias, pelvic pain, perianal numbness, tingling, weakness or weight loss. She has tried analgesics for the symptoms. The treatment provided significant relief.  Hypertension This is a chronic problem. The current episode started more than 1 year ago. The problem has been gradually improving since onset. The problem is controlled. Pertinent negatives include no anxiety, blurred vision, chest pain, headaches, malaise/fatigue, neck pain, orthopnea, palpitations, peripheral edema, PND, shortness of breath or sweats. Past treatments include angiotensin blockers and diuretics. The current treatment provides significant improvement. Compliance problems include exercise and diet.       Review of Systems  Constitutional: Negative for fever, chills, weight loss, malaise/fatigue, diaphoresis, activity change, appetite change, fatigue and unexpected weight change.  HENT: Negative.  Negative for neck pain.   Eyes: Negative.  Negative for blurred vision.  Respiratory: Negative for apnea, cough, choking, chest tightness, shortness of breath, wheezing and stridor.   Cardiovascular: Negative for chest pain, palpitations, orthopnea, leg swelling and PND.  Gastrointestinal: Negative for nausea, vomiting, abdominal pain,  diarrhea, constipation, abdominal distention and bowel incontinence.  Genitourinary: Negative for bladder incontinence, dysuria, urgency, frequency, hematuria, flank pain, decreased urine volume, enuresis, difficulty urinating, pelvic pain and dyspareunia.  Musculoskeletal: Positive for back pain and arthralgias. Negative for myalgias, joint swelling and gait problem.  Skin: Negative for color change, pallor, rash and wound.  Neurological: Negative for dizziness, tingling, tremors, seizures, syncope, facial asymmetry, speech difficulty, weakness, light-headedness, numbness, headaches and paresthesias.  Hematological: Negative for adenopathy. Does not bruise/bleed easily.  Psychiatric/Behavioral: Negative.        Objective:   Physical Exam  Vitals reviewed. Constitutional: She is oriented to person, place, and time. She appears well-developed and well-nourished. No distress.  HENT:  Head: Normocephalic and atraumatic.  Mouth/Throat: Oropharynx is clear and moist. No oropharyngeal exudate.  Eyes: Conjunctivae are normal. Right eye exhibits no discharge. Left eye exhibits no discharge. No scleral icterus.  Neck: Normal range of motion. Neck supple. No JVD present. No tracheal deviation present. No thyromegaly present.  Cardiovascular: Normal rate, regular rhythm, normal heart sounds and intact distal pulses.  Exam reveals no gallop and no friction rub.   No murmur heard. Pulmonary/Chest: Effort normal and breath sounds normal. No stridor. No respiratory distress. She has no wheezes. She has no rales. She exhibits no tenderness.  Abdominal: Soft. Bowel sounds are normal. She exhibits no distension and no mass. There is no tenderness. There is no rebound and no guarding.  Musculoskeletal: Normal range of motion. She exhibits no edema and no tenderness.  Lymphadenopathy:    She has no cervical adenopathy.  Neurological: She is oriented to person, place, and time. She displays normal reflexes. No  cranial nerve deficit. She exhibits normal muscle tone. Coordination normal.  Skin: Skin is warm and dry. No rash noted. She is  not diaphoretic. No erythema. No pallor.  Psychiatric: She has a normal mood and affect. Her behavior is normal. Judgment and thought content normal.      Lab Results  Component Value Date   WBC 6.2 06/05/2010   HGB 13.4 06/05/2010   HCT 40.4 06/05/2010   PLT 229 06/05/2010   GLUCOSE 95 05/22/2010   GLUCOSE 95 05/22/2010   GLUCOSE 95 05/22/2010   GLUCOSE 95 05/22/2010   CHOL 205* 06/10/2009   TRIG 178.0* 06/10/2009   HDL 42.70 06/10/2009   LDLDIRECT 144.9 06/10/2009   ALT 23 05/22/2010   ALT 23 05/22/2010   ALT 23 05/22/2010   ALT 23 05/22/2010   AST 23 05/22/2010   AST 23 05/22/2010   AST 23 05/22/2010   AST 23 05/22/2010   NA 138 05/22/2010   NA 138 05/22/2010   NA 138 05/22/2010   NA 138 05/22/2010   K 4.0 05/22/2010   K 4.0 05/22/2010   K 4.0 05/22/2010   K 4.0 05/22/2010   CL 102 05/22/2010   CL 102 05/22/2010   CL 102 05/22/2010   CL 102 05/22/2010   CREATININE 1.06 05/22/2010   CREATININE 1.06 05/22/2010   CREATININE 1.06 05/22/2010   CREATININE 1.06 05/22/2010   BUN 23 05/22/2010   BUN 23 05/22/2010   BUN 23 05/22/2010   BUN 23 05/22/2010   CO2 24 05/22/2010   CO2 24 05/22/2010   CO2 24 05/22/2010   CO2 24 05/22/2010   TSH 1.12 06/10/2009   INR 0.97 06/05/2010      Assessment & Plan:

## 2011-01-21 ENCOUNTER — Encounter: Payer: Self-pay | Admitting: Internal Medicine

## 2011-01-21 NOTE — Assessment & Plan Note (Signed)
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

## 2011-01-21 NOTE — Assessment & Plan Note (Signed)
She has seen Dr. Gaylyn Rong ([facility]) and was told that she does not have cancer and is being observed

## 2011-01-21 NOTE — Assessment & Plan Note (Signed)
Her BP is well controlled, I will check her lytes and renal function today 

## 2011-01-21 NOTE — Assessment & Plan Note (Signed)
She is doing well on spiriva 

## 2011-01-24 ENCOUNTER — Telehealth: Payer: Self-pay

## 2011-01-24 DIAGNOSIS — I10 Essential (primary) hypertension: Secondary | ICD-10-CM

## 2011-01-24 MED ORDER — VALSARTAN-HYDROCHLOROTHIAZIDE 160-25 MG PO TABS: 1.0000 | ORAL_TABLET | Freq: Every day | ORAL | Status: DC

## 2011-01-24 NOTE — Telephone Encounter (Signed)
Benicar not covered by insurance, alternative are generic diovan hctz or micardis hctz, please advise Thanks

## 2011-01-24 NOTE — Telephone Encounter (Signed)
done

## 2011-02-23 ENCOUNTER — Ambulatory Visit (INDEPENDENT_AMBULATORY_CARE_PROVIDER_SITE_OTHER): Payer: BC Managed Care – PPO | Admitting: Internal Medicine

## 2011-02-23 ENCOUNTER — Encounter: Payer: Self-pay | Admitting: Internal Medicine

## 2011-02-23 ENCOUNTER — Ambulatory Visit (INDEPENDENT_AMBULATORY_CARE_PROVIDER_SITE_OTHER)
Admission: RE | Admit: 2011-02-23 | Discharge: 2011-02-23 | Disposition: A | Discharge status: Home / Self Care | Payer: BC Managed Care – PPO | Source: Ambulatory Visit | Attending: Internal Medicine | Admitting: Internal Medicine

## 2011-02-23 VITALS — BP 140/88 | HR 87 | Temp 100.0°F | Resp 20 | Wt 159.0 lb

## 2011-02-23 DIAGNOSIS — J441 Chronic obstructive pulmonary disease with (acute) exacerbation: Secondary | ICD-10-CM

## 2011-02-23 DIAGNOSIS — R05 Cough: Secondary | ICD-10-CM

## 2011-02-23 DIAGNOSIS — J209 Acute bronchitis, unspecified: Secondary | ICD-10-CM

## 2011-02-23 MED ORDER — PSEUDOEPH-CHLORPHEN-HYDROCOD 60-4-5 MG/5ML PO SOLN: 5.0000 mL | Freq: Four times a day (QID) | ORAL | Status: DC | PRN

## 2011-02-23 MED ORDER — METHYLPREDNISOLONE ACETATE 80 MG/ML IJ SUSP
120.0000 mg | Freq: Once | INTRAMUSCULAR | Status: AC
  Administered 2011-02-23: 120 mg via INTRAMUSCULAR

## 2011-02-23 MED ORDER — MOXIFLOXACIN HCL 400 MG PO TABS
400.0000 mg | ORAL_TABLET | Freq: Every day | ORAL | Status: AC
Start: 2011-02-23 — End: 2011-03-05

## 2011-02-23 NOTE — Progress Notes (Signed)
  Subjective:    Patient ID: Kelli Reyes, female    DOB: June 27, 1945, 65 y.o.   MRN: [id number]  Cough This is a new problem. Episode onset: 10 days ago. The problem has been gradually worsening. The problem occurs every few minutes. The cough is productive of brown sputum. Associated symptoms include chills, a fever, myalgias, nasal congestion, shortness of breath, sweats and wheezing. Pertinent negatives include no chest pain, ear congestion, heartburn, postnasal drip, rash or weight loss. The symptoms are aggravated by nothing. She has tried ipratropium inhaler for the symptoms. The treatment provided no relief.      Review of Systems  Constitutional: Positive for fever, chills and fatigue. Negative for weight loss, diaphoresis, activity change and unexpected weight change.  HENT: Negative for facial swelling, trouble swallowing, neck pain, neck stiffness, voice change and postnasal drip.   Eyes: Negative.   Respiratory: Positive for cough, shortness of breath and wheezing. Negative for apnea, choking, chest tightness and stridor.   Cardiovascular: Negative for chest pain, palpitations and leg swelling.  Gastrointestinal: Negative for heartburn, nausea, abdominal pain, diarrhea, constipation and abdominal distention.  Genitourinary: Negative for dysuria, urgency, frequency, hematuria, decreased urine volume, enuresis, difficulty urinating and dyspareunia.  Musculoskeletal: Positive for myalgias. Negative for back pain, joint swelling, arthralgias and gait problem.  Skin: Negative for color change, pallor, rash and wound.  Neurological: Negative.   Hematological: Negative for adenopathy. Does not bruise/bleed easily.  Psychiatric/Behavioral: Negative.        Objective:   Physical Exam  Vitals reviewed. Constitutional: She is oriented to person, place, and time. She appears well-developed and well-nourished. No distress.  HENT:  Head: Normocephalic and atraumatic.  Mouth/Throat:  Oropharynx is clear and moist. No oropharyngeal exudate.  Eyes: Conjunctivae are normal. Right eye exhibits no discharge. Left eye exhibits no discharge. No scleral icterus.  Neck: Normal range of motion. Neck supple. No JVD present. No tracheal deviation present. No thyromegaly present.  Cardiovascular: Normal rate, regular rhythm, normal heart sounds and intact distal pulses.  Exam reveals no gallop and no friction rub.   No murmur heard. Pulmonary/Chest: Effort normal. No accessory muscle usage or stridor. Not tachypneic. No respiratory distress. She has no decreased breath sounds. She has no wheezes. She has rhonchi in the right middle field and the left middle field. She has no rales. She exhibits no tenderness.  Abdominal: Soft. Bowel sounds are normal. She exhibits no distension and no mass. There is no tenderness. There is no rebound and no guarding.  Musculoskeletal: Normal range of motion. She exhibits no edema and no tenderness.  Lymphadenopathy:    She has no cervical adenopathy.  Neurological: She is oriented to person, place, and time.  Skin: Skin is warm and dry. No rash noted. She is not diaphoretic. No erythema. No pallor.  Psychiatric: She has a normal mood and affect. Her behavior is normal. Judgment and thought content normal.          Assessment & Plan:

## 2011-02-23 NOTE — Assessment & Plan Note (Addendum)
She was given depo-medrol IM to reduce the inflammation and avelox for the infection

## 2011-02-23 NOTE — Patient Instructions (Signed)

## 2011-02-23 NOTE — Assessment & Plan Note (Signed)
Start avelox for the infection and rezira for the cough

## 2011-02-23 NOTE — Assessment & Plan Note (Signed)
I will check a CXR for pna ,edema , mass

## 2011-04-30 ENCOUNTER — Encounter: Payer: Self-pay | Admitting: Internal Medicine

## 2011-04-30 ENCOUNTER — Other Ambulatory Visit (INDEPENDENT_AMBULATORY_CARE_PROVIDER_SITE_OTHER): Payer: BC Managed Care – PPO

## 2011-04-30 ENCOUNTER — Ambulatory Visit (INDEPENDENT_AMBULATORY_CARE_PROVIDER_SITE_OTHER): Payer: BC Managed Care – PPO | Admitting: Internal Medicine

## 2011-04-30 VITALS — BP 130/80 | HR 90 | Temp 98.6°F | Resp 16 | Wt 156.0 lb

## 2011-04-30 DIAGNOSIS — J449 Chronic obstructive pulmonary disease, unspecified: Secondary | ICD-10-CM

## 2011-04-30 DIAGNOSIS — D472 Monoclonal gammopathy: Secondary | ICD-10-CM

## 2011-04-30 DIAGNOSIS — M1990 Unspecified osteoarthritis, unspecified site: Secondary | ICD-10-CM

## 2011-04-30 DIAGNOSIS — I10 Essential (primary) hypertension: Secondary | ICD-10-CM

## 2011-04-30 DIAGNOSIS — E782 Mixed hyperlipidemia: Secondary | ICD-10-CM

## 2011-04-30 DIAGNOSIS — Z0000 Encounter for general adult medical examination without abnormal findings: Secondary | ICD-10-CM

## 2011-04-30 DIAGNOSIS — M545 Low back pain: Secondary | ICD-10-CM

## 2011-04-30 DIAGNOSIS — E781 Pure hyperglyceridemia: Secondary | ICD-10-CM | POA: Insufficient documentation

## 2011-04-30 DIAGNOSIS — R109 Unspecified abdominal pain: Secondary | ICD-10-CM

## 2011-04-30 LAB — CBC WITH DIFFERENTIAL/PLATELET
Basophils Absolute: 0 10*3/uL (ref 0.0–0.1)
Eosinophils Absolute: 0.3 10*3/uL (ref 0.0–0.7)
MCHC: 34.2 g/dL (ref 30.0–36.0)
MCV: 90.8 fl (ref 78.0–100.0)
Monocytes Absolute: 0.6 10*3/uL (ref 0.1–1.0)
Neutrophils Relative %: 62 % (ref 43.0–77.0)
Platelets: 257 10*3/uL (ref 150.0–400.0)
WBC: 5.7 10*3/uL (ref 4.5–10.5)

## 2011-04-30 LAB — URINALYSIS, ROUTINE W REFLEX MICROSCOPIC
Bilirubin Urine: NEGATIVE
Ketones, ur: NEGATIVE
Nitrite: NEGATIVE
Total Protein, Urine: NEGATIVE
pH: 5.5 (ref 5.0–8.0)

## 2011-04-30 LAB — COMPREHENSIVE METABOLIC PANEL
AST: 27 U/L (ref 0–37)
Albumin: 4 g/dL (ref 3.5–5.2)
Alkaline Phosphatase: 132 U/L — ABNORMAL HIGH (ref 39–117)
Potassium: 3.6 mEq/L (ref 3.5–5.1)
Sodium: 141 mEq/L (ref 135–145)
Total Protein: 8.2 g/dL (ref 6.0–8.3)

## 2011-04-30 LAB — LDL CHOLESTEROL, DIRECT: Direct LDL: 169.7 mg/dL

## 2011-04-30 LAB — LIPID PANEL
HDL: 42.6 mg/dL (ref >39.00)
Triglycerides: 201 mg/dL — ABNORMAL HIGH (ref 0.0–149.0)

## 2011-04-30 MED ORDER — OLMESARTAN MEDOXOMIL-HCTZ 40-25 MG PO TABS: 1.0000 | ORAL_TABLET | Freq: Every day | ORAL | Status: DC

## 2011-04-30 MED ORDER — HYDROCOD POLST-CHLORPHEN POLST 10-8 MG/5ML PO LQCR: 5.0000 mL | Freq: Two times a day (BID) | ORAL | Status: DC | PRN

## 2011-04-30 MED ORDER — HYDROMORPHONE HCL 2 MG PO TABS: 2.0000 mg | ORAL_TABLET | Freq: Four times a day (QID) | ORAL | Status: DC | PRN

## 2011-04-30 NOTE — Progress Notes (Signed)
Subjective:    Patient ID: Ree Kida, female    DOB: 11-13-1945, 66 y.o.   MRN: [id number]  Hypertension This is a chronic problem. The current episode started more than 1 year ago. The problem has been gradually improving since onset. The problem is controlled. Pertinent negatives include no anxiety, blurred vision, chest pain, headaches, malaise/fatigue, neck pain, orthopnea, palpitations, peripheral edema, PND, shortness of breath or sweats. Past treatments include angiotensin blockers and diuretics. The current treatment provides significant improvement. Compliance problems include exercise and diet.       Review of Systems  Constitutional: Negative for fever, chills, malaise/fatigue, diaphoresis, activity change, appetite change, fatigue and unexpected weight change.  HENT: Positive for congestion, rhinorrhea and postnasal drip. Negative for hearing loss, ear pain, nosebleeds, sore throat, facial swelling, sneezing, trouble swallowing, neck pain, neck stiffness, voice change, sinus pressure, tinnitus and ear discharge.   Eyes: Negative.  Negative for blurred vision.  Respiratory: Positive for cough (chronic, unchanged). Negative for apnea, choking, chest tightness, shortness of breath, wheezing and stridor.   Cardiovascular: Negative for chest pain, palpitations, orthopnea, leg swelling and PND.  Gastrointestinal: Negative for nausea, vomiting, abdominal pain, diarrhea, constipation, abdominal distention and anal bleeding.  Genitourinary: Negative.   Musculoskeletal: Positive for back pain (chronic, unchanged) and arthralgias (chronic, unchanged). Negative for myalgias, joint swelling and gait problem.  Skin: Negative for color change, pallor, rash and wound.  Neurological: Negative for dizziness, tremors, seizures, syncope, facial asymmetry, speech difficulty, weakness, light-headedness, numbness and headaches.  Hematological: Negative for adenopathy. Does not bruise/bleed easily.    Psychiatric/Behavioral: Negative.        Objective:   Physical Exam  Vitals reviewed. Constitutional: She is oriented to person, place, and time. She appears well-developed and well-nourished. No distress.  HENT:  Head: Normocephalic and atraumatic.  Mouth/Throat: Oropharynx is clear and moist. No oropharyngeal exudate.  Eyes: Conjunctivae are normal. Right eye exhibits no discharge. Left eye exhibits no discharge. No scleral icterus.  Neck: Normal range of motion. Neck supple. No JVD present. No tracheal deviation present. No thyromegaly present.  Cardiovascular: Normal rate, regular rhythm, normal heart sounds and intact distal pulses.  Exam reveals no gallop and no friction rub.   No murmur heard. Pulmonary/Chest: Effort normal and breath sounds normal. No respiratory distress. She has no wheezes. She has no rales. She exhibits no tenderness.  Abdominal: Soft. Bowel sounds are normal. She exhibits no distension and no mass. There is no tenderness. There is no rebound.  Musculoskeletal: Normal range of motion. She exhibits no edema and no tenderness.  Lymphadenopathy:    She has no cervical adenopathy.  Neurological: She is oriented to person, place, and time.  Skin: Skin is warm and dry. No rash noted. She is not diaphoretic. No erythema. No pallor.  Psychiatric: She has a normal mood and affect. Her behavior is normal. Judgment and thought content normal.      Lab Results  Component Value Date   WBC 5.3 01/19/2011   HGB 13.4 01/19/2011   HCT 40.0 01/19/2011   PLT 236.0 01/19/2011   GLUCOSE 94 01/19/2011   CHOL 205* 06/10/2009   TRIG 178.0* 06/10/2009   HDL 42.70 06/10/2009   LDLDIRECT 144.9 06/10/2009   ALT 31 01/19/2011   AST 27 01/19/2011   NA 145 01/19/2011   K 4.7 01/19/2011   CL 108 01/19/2011   CREATININE 0.9 01/19/2011   BUN 18 01/19/2011   CO2 27 01/19/2011   TSH 1.12 06/10/2009   INR  0.97 06/05/2010      Assessment & Plan:

## 2011-04-30 NOTE — Assessment & Plan Note (Signed)
I will change her cough med to tussionex to avoid the decongestant, she'll continue spiriva

## 2011-04-30 NOTE — Patient Instructions (Signed)
Hypertension As your heart beats, it forces blood through your arteries. This force is your blood pressure. If the pressure is too high, it is called hypertension (HTN) or high blood pressure. HTN is dangerous because you may have it and not know it. High blood pressure may mean that your heart has to work harder to pump blood. Your arteries may be narrow or stiff. The extra work puts you at risk for heart disease, stroke, and other problems.  Blood pressure consists of two numbers, a higher number over a lower, 110/72, for example. It is stated as "110 over 72." The ideal is below 120 for the top number (systolic) and under 80 for the bottom (diastolic). Write down your blood pressure today. You should pay close attention to your blood pressure if you have certain conditions such as:  Heart failure.   Prior heart attack.   Diabetes   Chronic kidney disease.   Prior stroke.   Multiple risk factors for heart disease.  To see if you have HTN, your blood pressure should be measured while you are seated with your arm held at the level of the heart. It should be measured at least twice. A one-time elevated blood pressure reading (especially in the Emergency Department) does not mean that you need treatment. There may be conditions in which the blood pressure is different between your right and left arms. It is important to see your caregiver soon for a recheck. Most people have essential hypertension which means that there is not a specific cause. This type of high blood pressure may be lowered by changing lifestyle factors such as:  Stress.   Smoking.   Lack of exercise.   Excessive weight.   Drug/tobacco/alcohol use.   Eating less salt.  Most people do not have symptoms from high blood pressure until it has caused damage to the body. Effective treatment can often prevent, delay or reduce that damage. TREATMENT  When a cause has been identified, treatment for high blood pressure is  directed at the cause. There are a large number of medications to treat HTN. These fall into several categories, and your caregiver will help you select the medicines that are best for you. Medications may have side effects. You should review side effects with your caregiver. If your blood pressure stays high after you have made lifestyle changes or started on medicines,   Your medication(s) may need to be changed.   Other problems may need to be addressed.   Be certain you understand your prescriptions, and know how and when to take your medicine.   Be sure to follow up with your caregiver within the time frame advised (usually within two weeks) to have your blood pressure rechecked and to review your medications.   If you are taking more than one medicine to lower your blood pressure, make sure you know how and at what times they should be taken. Taking two medicines at the same time can result in blood pressure that is too low.  SEEK IMMEDIATE MEDICAL CARE IF:  You develop a severe headache, blurred or changing vision, or confusion.   You have unusual weakness or numbness, or a faint feeling.   You have severe chest or abdominal pain, vomiting, or breathing problems.  MAKE SURE YOU:   Understand these instructions.   Will watch your condition.   Will get help right away if you are not doing well or get worse.  Document Released: 03/12/2005 Document Revised: 11/22/2010 Document Reviewed:   10/31/2007 [employer] Patient Information 2012 [place], [place].Chronic Obstructive Pulmonary Disease Chronic obstructive pulmonary disease (COPD) is a condition in which airflow from the lungs is restricted. The lungs can never return to normal, but there are measures you can take which will improve them and make you feel better. CAUSES   Smoking.   Exposure to secondhand smoke.   Breathing in irritants (pollution, cigarette smoke, strong smells, aerosol sprays, paint fumes).   History of lung  infections.  TREATMENT  Treatment focuses on making you comfortable (supportive care). Your caregiver may prescribe medications (inhaled or pills) to help improve your breathing. HOME CARE INSTRUCTIONS   If you smoke, stop smoking.   Avoid exposure to smoke, chemicals, and fumes that aggravate your breathing.   Take antibiotic medicines as directed by your caregiver.   Avoid medicines that dry up your system and slow down the elimination of secretions (antihistamines and cough syrups). This decreases respiratory capacity and may lead to infections.   Drink enough water and fluids to keep your urine clear or pale yellow. This loosens secretions.   Use humidifiers at home and at your bedside if they do not make breathing difficult.   Receive all protective vaccines your caregiver suggests, especially pneumococcal and influenza.   Use home oxygen as suggested.   Stay active. Exercise and physical activity will help maintain your ability to do things you want to do.   Eat a healthy diet.  SEEK MEDICAL CARE IF:   You develop pus-like mucus (sputum).   Breathing is more labored or exercise becomes difficult to do.   You are running out of the medicine you take for your breathing.  SEEK IMMEDIATE MEDICAL CARE IF:   You have a rapid heart rate.   You have agitation, confusion, tremors, or are in a stupor (family members may need to observe this).   It becomes difficult to breathe.   You develop chest pain.   You have a fever.  MAKE SURE YOU:   Understand these instructions.   Will watch your condition.   Will get help right away if you are not doing well or get worse.  Document Released: 12/20/2004 Document Revised: 11/22/2010 Document Reviewed: 05/12/2010 [facility] Patient Information 2012 [place], [place].

## 2011-04-30 NOTE — Assessment & Plan Note (Signed)
I will recheck her CBC, CMP, and SPEP today

## 2011-04-30 NOTE — Assessment & Plan Note (Signed)
Continue the current meds

## 2011-04-30 NOTE — Assessment & Plan Note (Signed)
Her BP is well controlled, I will check her lytes and renal function today 

## 2011-04-30 NOTE — Assessment & Plan Note (Signed)
FLP today 

## 2011-05-02 LAB — PROTEIN ELECTROPHORESIS, SERUM
Alpha-1-Globulin: 4.7 % (ref 2.9–4.9)
Alpha-2-Globulin: 10.2 % (ref 7.1–11.8)
Total Protein, Serum Electrophoresis: 8.5 g/dL — ABNORMAL HIGH (ref 6.0–8.3)

## 2011-05-04 ENCOUNTER — Ambulatory Visit: Payer: BC Managed Care – PPO | Admitting: Internal Medicine

## 2011-06-25 ENCOUNTER — Other Ambulatory Visit: Payer: Self-pay

## 2011-06-25 NOTE — Telephone Encounter (Signed)
Pt called requesting a refill of cough syrup. Pt says that cough had recently returned in the last 4 days, even with the use of allergy medication.

## 2011-06-25 NOTE — Telephone Encounter (Signed)
Pt called requesting a refill of cough syrup. Pt says that cough had recently returned in the last 4 days, even with the use of allergy medication.   

## 2011-06-25 NOTE — Telephone Encounter (Signed)
no

## 2011-06-27 NOTE — Telephone Encounter (Signed)
Pharmacy notified.

## 2011-06-28 ENCOUNTER — Encounter: Payer: Self-pay | Admitting: Internal Medicine

## 2011-06-28 ENCOUNTER — Ambulatory Visit (INDEPENDENT_AMBULATORY_CARE_PROVIDER_SITE_OTHER): Payer: BC Managed Care – PPO | Admitting: Internal Medicine

## 2011-06-28 ENCOUNTER — Ambulatory Visit (INDEPENDENT_AMBULATORY_CARE_PROVIDER_SITE_OTHER)
Admission: RE | Admit: 2011-06-28 | Discharge: 2011-06-28 | Disposition: A | Discharge status: Home / Self Care | Payer: BC Managed Care – PPO | Source: Ambulatory Visit | Attending: Internal Medicine | Admitting: Internal Medicine

## 2011-06-28 VITALS — BP 128/72 | HR 80 | Temp 98.5°F | Resp 20 | Wt 152.0 lb

## 2011-06-28 DIAGNOSIS — E7800 Pure hypercholesterolemia, unspecified: Secondary | ICD-10-CM

## 2011-06-28 DIAGNOSIS — R05 Cough: Secondary | ICD-10-CM

## 2011-06-28 DIAGNOSIS — J449 Chronic obstructive pulmonary disease, unspecified: Secondary | ICD-10-CM

## 2011-06-28 DIAGNOSIS — J441 Chronic obstructive pulmonary disease with (acute) exacerbation: Secondary | ICD-10-CM

## 2011-06-28 DIAGNOSIS — R918 Other nonspecific abnormal finding of lung field: Secondary | ICD-10-CM

## 2011-06-28 DIAGNOSIS — E785 Hyperlipidemia, unspecified: Secondary | ICD-10-CM | POA: Insufficient documentation

## 2011-06-28 DIAGNOSIS — E782 Mixed hyperlipidemia: Secondary | ICD-10-CM

## 2011-06-28 DIAGNOSIS — D472 Monoclonal gammopathy: Secondary | ICD-10-CM

## 2011-06-28 DIAGNOSIS — I10 Essential (primary) hypertension: Secondary | ICD-10-CM

## 2011-06-28 MED ORDER — METHYLPREDNISOLONE ACETATE 80 MG/ML IJ SUSP
120.0000 mg | Freq: Once | INTRAMUSCULAR | Status: AC
  Administered 2011-06-28: 120 mg via INTRAMUSCULAR

## 2011-06-28 MED ORDER — PITAVASTATIN CALCIUM 4 MG PO TABS: 1.0000 | ORAL_TABLET | Freq: Every day | ORAL | Status: DC

## 2011-06-28 MED ORDER — HYDROCOD POLST-CHLORPHEN POLST 10-8 MG/5ML PO LQCR: 5.0000 mL | Freq: Two times a day (BID) | ORAL | Status: DC | PRN

## 2011-06-28 MED ORDER — IOHEXOL 300 MG/ML  SOLN
80.0000 mL | Freq: Once | INTRAMUSCULAR | Status: AC | PRN
  Administered 2011-06-28: 80 mL via INTRAVENOUS

## 2011-06-28 NOTE — Assessment & Plan Note (Signed)
Her BP is well controlled 

## 2011-06-28 NOTE — Assessment & Plan Note (Signed)
She was referred for a f/up CT scan of the chest to check the stability of the nodules

## 2011-06-28 NOTE — Assessment & Plan Note (Signed)
She was given depo-medrol IM today for the exacerbation

## 2011-06-28 NOTE — Progress Notes (Signed)
Addended by: Rock Nephew T on: 06/28/2011 09:53 AM   Modules accepted: Orders

## 2011-06-28 NOTE — Progress Notes (Deleted)
  Subjective:    Patient ID: Kelli Reyes, female    DOB: 1945-06-02, 66 y.o.   MRN: [id number]  Hyperlipidemia  Hypertension  Cough      Review of Systems  Respiratory: Positive for cough.        Objective:   Physical Exam        Assessment & Plan:

## 2011-06-28 NOTE — Patient Instructions (Signed)

## 2011-06-28 NOTE — Assessment & Plan Note (Signed)
She needs HemeOnc follow-up

## 2011-06-28 NOTE — Assessment & Plan Note (Signed)
Start livalo and pt ed material was given

## 2011-06-28 NOTE — Assessment & Plan Note (Signed)
Continue spiriva.  

## 2011-06-28 NOTE — Assessment & Plan Note (Signed)
Cough med was refilled today, she needs a f/up CT scan of her chest

## 2011-06-28 NOTE — Progress Notes (Addendum)
Subjective:    Patient ID: Kelli Reyes, female    DOB: 1946/03/25, 66 y.o.   MRN: [id number]  Cough This is a recurrent problem. The current episode started 1 to 4 weeks ago. The problem has been unchanged. The problem occurs every few hours. The cough is productive of sputum (clear phlegm). Associated symptoms include nasal congestion, postnasal drip, rhinorrhea and wheezing. Pertinent negatives include no chest pain, chills, ear congestion, ear pain, eye redness, fever, headaches, heartburn, hemoptysis, myalgias, rash, sore throat, shortness of breath, sweats or weight loss. The symptoms are aggravated by cold air. She has tried ipratropium inhaler for the symptoms. The treatment provided moderate relief. Her past medical history is significant for COPD and emphysema.  Hyperlipidemia This is a new problem. The current episode started more than 1 year ago. The problem is uncontrolled. Recent lipid tests were reviewed and are variable. Exacerbating diseases include obesity. She has no history of chronic renal disease, diabetes, hypothyroidism, liver disease or nephrotic syndrome. Factors aggravating her hyperlipidemia include thiazides and fatty foods. Pertinent negatives include no chest pain, focal sensory loss, focal weakness, leg pain, myalgias or shortness of breath. She is currently on no antihyperlipidemic treatment. Compliance problems include adherence to exercise and adherence to diet.   Hypertension This is a chronic problem. The current episode started more than 1 year ago. The problem has been gradually improving since onset. The problem is controlled. Pertinent negatives include no anxiety, blurred vision, chest pain, headaches, malaise/fatigue, neck pain, orthopnea, palpitations, peripheral edema, PND, shortness of breath or sweats. Past treatments include angiotensin blockers and diuretics. The current treatment provides significant improvement. Compliance problems include exercise and  diet.  There is no history of chronic renal disease.      Review of Systems  Constitutional: Negative for fever, chills, weight loss, malaise/fatigue, diaphoresis, activity change, appetite change, fatigue and unexpected weight change.  HENT: Positive for rhinorrhea and postnasal drip. Negative for hearing loss, ear pain, nosebleeds, congestion, sore throat, facial swelling, sneezing, drooling, mouth sores, trouble swallowing, neck pain, neck stiffness, dental problem, voice change, sinus pressure, tinnitus and ear discharge.   Eyes: Negative for blurred vision, photophobia, pain, discharge, redness, itching and visual disturbance.  Respiratory: Positive for cough and wheezing. Negative for apnea, hemoptysis, choking, chest tightness, shortness of breath and stridor.   Cardiovascular: Negative for chest pain, palpitations, orthopnea, leg swelling and PND.  Gastrointestinal: Negative for heartburn, nausea, vomiting, abdominal pain, diarrhea, constipation, blood in stool, abdominal distention, anal bleeding and rectal pain.  Genitourinary: Negative for dysuria, urgency, frequency, hematuria, flank pain, decreased urine volume, enuresis, difficulty urinating and dyspareunia.  Musculoskeletal: Negative for myalgias, back pain, joint swelling, arthralgias and gait problem.  Skin: Negative for color change, pallor, rash and wound.  Neurological: Negative for dizziness, tremors, focal weakness, seizures, syncope, facial asymmetry, speech difficulty, weakness, light-headedness, numbness and headaches.  Hematological: Negative for adenopathy. Does not bruise/bleed easily.  Psychiatric/Behavioral: Negative.        Objective:   Physical Exam  Vitals reviewed. Constitutional: She is oriented to person, place, and time. She appears well-developed and well-nourished. No distress.  HENT:  Head: Normocephalic and atraumatic.  Mouth/Throat: Oropharynx is clear and moist. No oropharyngeal exudate.  Eyes:  Conjunctivae are normal. Right eye exhibits no discharge. Left eye exhibits no discharge. No scleral icterus.  Neck: Normal range of motion. Neck supple. No JVD present. No tracheal deviation present. No thyromegaly present.  Cardiovascular: Normal rate, regular rhythm, normal heart sounds and intact distal pulses.  Exam reveals no gallop and no friction rub.   No murmur heard. Pulmonary/Chest: Effort normal. No accessory muscle usage or stridor. Not tachypneic. No respiratory distress. She has no decreased breath sounds. She has wheezes in the right middle field and the left middle field. She has rhonchi in the right middle field and the left middle field. She has no rales. She exhibits no tenderness.  Abdominal: Soft. Bowel sounds are normal. She exhibits no distension and no mass. There is no tenderness. There is no rebound and no guarding.  Musculoskeletal: Normal range of motion. She exhibits no edema and no tenderness.  Lymphadenopathy:    She has no cervical adenopathy.  Neurological: She is oriented to person, place, and time.  Skin: Skin is warm and dry. No rash noted. She is not diaphoretic. No erythema. No pallor.  Psychiatric: She has a normal mood and affect. Her behavior is normal. Judgment and thought content normal.      Lab Results  Component Value Date   WBC 5.7 04/30/2011   HGB 13.4 04/30/2011   HCT 39.1 04/30/2011   PLT 257.0 04/30/2011   GLUCOSE 95 04/30/2011   CHOL 248* 04/30/2011   TRIG 201.0* 04/30/2011   HDL 42.60 04/30/2011   LDLDIRECT 169.7 04/30/2011   ALT 31 04/30/2011   AST 27 04/30/2011   NA 141 04/30/2011   K 3.6 04/30/2011   CL 106 04/30/2011   CREATININE 0.9 04/30/2011   BUN 24* 04/30/2011   CO2 26 04/30/2011   TSH 0.96 04/30/2011   INR 0.97 06/05/2010      Assessment & Plan:

## 2011-06-28 NOTE — Progress Notes (Signed)
Addended by: Etta Grandchild on: 06/28/2011 09:43 AM   Modules accepted: Orders, Level of Service

## 2011-07-11 ENCOUNTER — Other Ambulatory Visit: Payer: Self-pay | Admitting: Oncology

## 2011-07-12 ENCOUNTER — Telehealth: Payer: Self-pay | Admitting: Oncology

## 2011-07-12 NOTE — Telephone Encounter (Signed)
lmonvm adviisng the pt of her appt with the pa on 07/25/2011@2 :45pm

## 2011-07-16 ENCOUNTER — Telehealth: Payer: Self-pay | Admitting: Oncology

## 2011-07-16 NOTE — Telephone Encounter (Signed)
pts daughter Genevie Cheshire called for pt to r/s appt on 05/01 to 05/06

## 2011-07-20 ENCOUNTER — Ambulatory Visit (INDEPENDENT_AMBULATORY_CARE_PROVIDER_SITE_OTHER): Payer: BC Managed Care – PPO | Admitting: Internal Medicine

## 2011-07-20 ENCOUNTER — Encounter: Payer: Self-pay | Admitting: Internal Medicine

## 2011-07-20 VITALS — BP 138/86 | HR 80 | Temp 97.8°F | Resp 16 | Wt 148.3 lb

## 2011-07-20 DIAGNOSIS — J449 Chronic obstructive pulmonary disease, unspecified: Secondary | ICD-10-CM

## 2011-07-20 DIAGNOSIS — D472 Monoclonal gammopathy: Secondary | ICD-10-CM

## 2011-07-20 DIAGNOSIS — R109 Unspecified abdominal pain: Secondary | ICD-10-CM

## 2011-07-20 DIAGNOSIS — I10 Essential (primary) hypertension: Secondary | ICD-10-CM

## 2011-07-20 DIAGNOSIS — E7800 Pure hypercholesterolemia, unspecified: Secondary | ICD-10-CM

## 2011-07-20 DIAGNOSIS — J441 Chronic obstructive pulmonary disease with (acute) exacerbation: Secondary | ICD-10-CM

## 2011-07-20 DIAGNOSIS — M1990 Unspecified osteoarthritis, unspecified site: Secondary | ICD-10-CM

## 2011-07-20 DIAGNOSIS — M545 Low back pain: Secondary | ICD-10-CM

## 2011-07-20 MED ORDER — HYDROMORPHONE HCL 2 MG PO TABS: 2.0000 mg | ORAL_TABLET | Freq: Four times a day (QID) | ORAL | Status: DC | PRN

## 2011-07-20 MED ORDER — HYDROCOD POLST-CHLORPHEN POLST 10-8 MG/5ML PO LQCR: 5.0000 mL | Freq: Two times a day (BID) | ORAL | Status: DC | PRN

## 2011-07-20 NOTE — Assessment & Plan Note (Signed)
She tells me that she sees Dr. Gaylyn Rong for f/up about this soon

## 2011-07-20 NOTE — Progress Notes (Signed)
Subjective:    Patient ID: Kelli Reyes, female    DOB: 01-Feb-1946, 66 y.o.   MRN: [id number]  Cough This is a recurrent problem. The current episode started more than 1 year ago. The problem has been unchanged. The cough is productive of sputum (clear phlegm). Pertinent negatives include no chest pain, chills, ear congestion, ear pain, fever, headaches, heartburn, hemoptysis, myalgias, nasal congestion, postnasal drip, rash, rhinorrhea, sore throat, shortness of breath, sweats, weight loss or wheezing. The symptoms are aggravated by cold air. Risk factors for lung disease include smoking/tobacco exposure. She has tried prescription cough suppressant and ipratropium inhaler for the symptoms. The treatment provided significant relief. Her past medical history is significant for COPD.      Review of Systems  Constitutional: Negative for fever, chills, weight loss, diaphoresis, activity change, appetite change, fatigue and unexpected weight change.  HENT: Negative.  Negative for ear pain, sore throat, rhinorrhea and postnasal drip.   Eyes: Negative.   Respiratory: Positive for cough. Negative for apnea, hemoptysis, choking, chest tightness, shortness of breath, wheezing and stridor.   Cardiovascular: Negative for chest pain, palpitations and leg swelling.  Gastrointestinal: Negative for heartburn, nausea, vomiting, abdominal pain, diarrhea, constipation and blood in stool.  Genitourinary: Negative.   Musculoskeletal: Positive for back pain (chronic, unchanged). Negative for myalgias, joint swelling, arthralgias and gait problem.  Skin: Negative for color change, pallor, rash and wound.  Neurological: Negative.  Negative for headaches.  Hematological: Negative for adenopathy. Does not bruise/bleed easily.  Psychiatric/Behavioral: Negative.        Objective:   Physical Exam  Vitals reviewed. Constitutional: She is oriented to person, place, and time. She appears well-developed and  well-nourished. No distress.  HENT:  Head: Normocephalic and atraumatic.  Mouth/Throat: Oropharynx is clear and moist. No oropharyngeal exudate.  Eyes: Conjunctivae are normal. Right eye exhibits no discharge. Left eye exhibits no discharge. No scleral icterus.  Neck: Normal range of motion. Neck supple. No JVD present. No tracheal deviation present. No thyromegaly present.  Cardiovascular: Normal rate, regular rhythm, normal heart sounds and intact distal pulses.  Exam reveals no gallop and no friction rub.   No murmur heard. Pulmonary/Chest: Effort normal and breath sounds normal. No stridor. No respiratory distress. She has no wheezes. She has no rales. She exhibits no tenderness.  Abdominal: Soft. Bowel sounds are normal. She exhibits no distension and no mass. There is no tenderness. There is no rebound and no guarding.  Musculoskeletal: Normal range of motion. She exhibits no edema and no tenderness.  Lymphadenopathy:    She has no cervical adenopathy.  Neurological: She is oriented to person, place, and time.  Skin: Skin is warm and dry. No rash noted. She is not diaphoretic. No erythema. No pallor.  Psychiatric: She has a normal mood and affect. Her behavior is normal. Judgment and thought content normal.      Ct Chest W Contrast  06/28/2011  *RADIOLOGY REPORT*  Clinical Data: Follow up pulmonary nodules  CT CHEST WITH CONTRAST  Technique:  Multidetector CT imaging of the chest was performed following the standard protocol during bolus administration of intravenous contrast.  Contrast:  80 ml Omnipaque-300 IV  Comparison: CT chest dated 06/19/2010  Findings: Numerous scattered small calcified and noncalcified pulmonary nodules, the largest noncalcified nodule measuring 6 mm in the central right upper lobe (series 3/image 18).  This appearance is unchanged from prior CT.  No new/suspicious pulmonary nodules. No pleural effusion or pneumothorax.  Visualized right thyroid gland is  mildly  nodular.  The heart is normal in size.  No pericardial effusion. Atherosclerotic calcifications of the aortic arch.  Extensive calcified mediastinal and bilateral hilar lymph nodes. Small axillary lymph nodes which do not meet pathologic CT size criteria.  The visualized upper abdomen is notable for calcified upper abdominal lymph nodes and a right upper pole cyst.  IMPRESSION: Numerous scattered calcified and noncalcified pulmonary nodules, the largest noncalcified nodule measuring 6 mm in the right upper lobe, unchanged and likely benign.  Consider one additional follow- up CT in 12 months to document 2-year stability.  Calcified mediastinal, bilateral hilar, and upper abdominal lymphadenopathy, likely sequela of prior granulomatous disease.  Original Report Authenticated By: Charline Bills, M.D.     Lab Results  Component Value Date   WBC 5.7 04/30/2011   HGB 13.4 04/30/2011   HCT 39.1 04/30/2011   PLT 257.0 04/30/2011   GLUCOSE 95 04/30/2011   CHOL 248* 04/30/2011   TRIG 201.0* 04/30/2011   HDL 42.60 04/30/2011   LDLDIRECT 169.7 04/30/2011   ALT 31 04/30/2011   AST 27 04/30/2011   NA 141 04/30/2011   K 3.6 04/30/2011   CL 106 04/30/2011   CREATININE 0.9 04/30/2011   BUN 24* 04/30/2011   CO2 26 04/30/2011   TSH 0.96 04/30/2011   INR 0.97 06/05/2010   Assessment & Plan:

## 2011-07-20 NOTE — Patient Instructions (Signed)
Hypertension As your heart beats, it forces blood through your arteries. This force is your blood pressure. If the pressure is too high, it is called hypertension (HTN) or high blood pressure. HTN is dangerous because you may have it and not know it. High blood pressure may mean that your heart has to work harder to pump blood. Your arteries may be narrow or stiff. The extra work puts you at risk for heart disease, stroke, and other problems.  Blood pressure consists of two numbers, a higher number over a lower, 110/72, for example. It is stated as "110 over 72." The ideal is below 120 for the top number (systolic) and under 80 for the bottom (diastolic). Write down your blood pressure today. You should pay close attention to your blood pressure if you have certain conditions such as:  Heart failure.   Prior heart attack.   Diabetes   Chronic kidney disease.   Prior stroke.   Multiple risk factors for heart disease.  To see if you have HTN, your blood pressure should be measured while you are seated with your arm held at the level of the heart. It should be measured at least twice. A one-time elevated blood pressure reading (especially in the Emergency Department) does not mean that you need treatment. There may be conditions in which the blood pressure is different between your right and left arms. It is important to see your caregiver soon for a recheck. Most people have essential hypertension which means that there is not a specific cause. This type of high blood pressure may be lowered by changing lifestyle factors such as:  Stress.   Smoking.   Lack of exercise.   Excessive weight.   Drug/tobacco/alcohol use.   Eating less salt.  Most people do not have symptoms from high blood pressure until it has caused damage to the body. Effective treatment can often prevent, delay or reduce that damage. TREATMENT  When a cause has been identified, treatment for high blood pressure is  directed at the cause. There are a large number of medications to treat HTN. These fall into several categories, and your caregiver will help you select the medicines that are best for you. Medications may have side effects. You should review side effects with your caregiver. If your blood pressure stays high after you have made lifestyle changes or started on medicines,   Your medication(s) may need to be changed.   Other problems may need to be addressed.   Be certain you understand your prescriptions, and know how and when to take your medicine.   Be sure to follow up with your caregiver within the time frame advised (usually within two weeks) to have your blood pressure rechecked and to review your medications.   If you are taking more than one medicine to lower your blood pressure, make sure you know how and at what times they should be taken. Taking two medicines at the same time can result in blood pressure that is too low.  SEEK IMMEDIATE MEDICAL CARE IF:  You develop a severe headache, blurred or changing vision, or confusion.   You have unusual weakness or numbness, or a faint feeling.   You have severe chest or abdominal pain, vomiting, or breathing problems.  MAKE SURE YOU:   Understand these instructions.   Will watch your condition.   Will get help right away if you are not doing well or get worse.  Document Released: 03/12/2005 Document Revised: 03/01/2011 Document Reviewed:   10/31/2007 Exit[employer] Patient Information 2012 [employer].Chronic Obstructive Pulmonary Disease Chronic obstructive pulmonary disease (COPD) is a condition in which airflow from the lungs is restricted. The lungs can never return to normal, but there are measures you can take which will improve them and make you feel better. CAUSES   Smoking.   Exposure to secondhand smoke.   Breathing in irritants (pollution, cigarette smoke, strong smells, aerosol sprays, paint fumes).   History of lung  infections.  TREATMENT  Treatment focuses on making you comfortable (supportive care). Your caregiver may prescribe medications (inhaled or pills) to help improve your breathing. HOME CARE INSTRUCTIONS   If you smoke, stop smoking.   Avoid exposure to smoke, chemicals, and fumes that aggravate your breathing.   Take antibiotic medicines as directed by your caregiver.   Avoid medicines that dry up your system and slow down the elimination of secretions (antihistamines and cough syrups). This decreases respiratory capacity and may lead to infections.   Drink enough water and fluids to keep your urine clear or pale yellow. This loosens secretions.   Use humidifiers at home and at your bedside if they do not make breathing difficult.   Receive all protective vaccines your caregiver suggests, especially pneumococcal and influenza.   Use home oxygen as suggested.   Stay active. Exercise and physical activity will help maintain your ability to do things you want to do.   Eat a healthy diet.  SEEK MEDICAL CARE IF:   You develop pus-like mucus (sputum).   Breathing is more labored or exercise becomes difficult to do.   You are running out of the medicine you take for your breathing.  SEEK IMMEDIATE MEDICAL CARE IF:   You have a rapid heart rate.   You have agitation, confusion, tremors, or are in a stupor (family members may need to observe this).   It becomes difficult to breathe.   You develop chest pain.   You have a fever.  MAKE SURE YOU:   Understand these instructions.   Will watch your condition.   Will get help right away if you are not doing well or get worse.  Document Released: 12/20/2004 Document Revised: 03/01/2011 Document Reviewed: 05/12/2010 Exit[employer] Patient Information 2012 [employer]. 

## 2011-07-20 NOTE — Assessment & Plan Note (Signed)
Continue dilaudid as needed

## 2011-07-20 NOTE — Assessment & Plan Note (Signed)
She is doing well on livalo 

## 2011-07-20 NOTE — Assessment & Plan Note (Signed)
Her BP is well controlled 

## 2011-07-20 NOTE — Assessment & Plan Note (Signed)
She has improved on her current regimen so she'll cont

## 2011-07-23 ENCOUNTER — Telehealth: Payer: Self-pay

## 2011-07-23 NOTE — Telephone Encounter (Signed)
Med list updated and pharmacy notified.

## 2011-07-23 NOTE — Telephone Encounter (Signed)
1 tab 4 times a day as needed

## 2011-07-23 NOTE — Telephone Encounter (Signed)
Take 1 tablet (2 mg total) by mouth 4 (four) times daily as needed. Take 1-2 by mouth QID as needed for pain. Please clarify which set of direction is correct

## 2011-07-25 ENCOUNTER — Ambulatory Visit: Payer: BC Managed Care – PPO | Admitting: Oncology

## 2011-07-27 ENCOUNTER — Ambulatory Visit: Payer: BC Managed Care – PPO | Admitting: Oncology

## 2011-07-27 ENCOUNTER — Ambulatory Visit: Payer: BC Managed Care – PPO | Admitting: Internal Medicine

## 2011-07-30 ENCOUNTER — Encounter: Payer: Self-pay | Admitting: Gastroenterology

## 2011-07-30 ENCOUNTER — Ambulatory Visit (HOSPITAL_BASED_OUTPATIENT_CLINIC_OR_DEPARTMENT_OTHER): Payer: BC Managed Care – PPO | Admitting: Oncology

## 2011-07-30 ENCOUNTER — Ambulatory Visit: Payer: BC Managed Care – PPO | Admitting: Oncology

## 2011-07-30 ENCOUNTER — Encounter: Payer: Self-pay | Admitting: Oncology

## 2011-07-30 VITALS — BP 130/80 | HR 114 | Temp 98.3°F | Ht 62.0 in | Wt 151.2 lb

## 2011-07-30 DIAGNOSIS — M1990 Unspecified osteoarthritis, unspecified site: Secondary | ICD-10-CM

## 2011-07-30 DIAGNOSIS — R918 Other nonspecific abnormal finding of lung field: Secondary | ICD-10-CM

## 2011-07-30 NOTE — Progress Notes (Signed)
[facility]  Telephone:([phone]) [phone] Fax:([phone]) [phone]   OFFICE PROGRESS NOTE  Cc:  Sanda Linger, MD, MD  DIAGNOSIS:  1. Lung nodules 2. Osteoarthritis  CURRENT THERAPY: 1. Watchful observation 2. Dilaudid PRN for pain  INTERVAL HISTORY: Kelli Reyes 66 y.o. female returns for routine follow-up by herself. It has been over a year since we last saw her. She cancelled her follow-up appointment and CT scan scheduled for last June. PCP has checked a CT scan in April 2013 and labs drawn in February 2013. She continue to take Dilaudid PRN for her low back/hip pain. Typically only takes Dilaudid at nighttime before going to bed because she droves the bus in the morning.  She reported that she has been able to ride the bus and no major pain in the morning.  She has a good appetite, without any anorexia or weight loss.  She denied headache, visual changes, nausea, vomiting.  She does have a cough; however, this is from nasal drainage and is using Tussionex PRN.  She denies chest pain, palpitations, PND, orthopnea, pedal edema, abdominal pain, abdominal swelling, melena, hematochezia, hematuria.    Past Medical History  Diagnosis Date  . COPD (chronic obstructive pulmonary disease)   . GERD (gastroesophageal reflux disease)   . Hypertension   . Osteoarthritis     Past Surgical History  Procedure Date  . Abdominal hysterectomy     Current Outpatient Prescriptions  Medication Sig Dispense Refill  . chlorpheniramine-HYDROcodone (TUSSIONEX PENNKINETIC ER) 10-8 MG/5ML LQCR Take 5 mLs by mouth every 12 (twelve) hours as needed.  115 mL  1  . HYDROmorphone (DILAUDID) 2 MG tablet Take 2 mg by mouth 4 (four) times daily as needed.      Marland Kitchen olmesartan-hydrochlorothiazide (BENICAR HCT) 40-25 MG per tablet Take 1 tablet by mouth daily.  70 tablet  0  . Pitavastatin Calcium (LIVALO) 4 MG TABS Take 1 tablet (4 mg total) by mouth daily.  90 tablet  3  . tiotropium (SPIRIVA HANDIHALER)  18 MCG inhalation capsule Place 18 mcg into inhaler and inhale daily.        Marland Kitchen omeprazole (PRILOSEC) 20 MG capsule Take 20 mg by mouth daily.          ALLERGIES:  is allergic to ibuprofen.  REVIEW OF SYSTEMS:  The rest of the 14-point review of system was negative.   Filed Vitals:   07/30/11 1038  BP: 130/80  Pulse: 114  Temp: 98.3 F (36.8 C)   Wt Readings from Last 3 Encounters:  07/30/11 151 lb 3.2 oz (68.584 kg)  07/20/11 148 lb 5 oz (67.274 kg)  06/28/11 152 lb (68.947 kg)   ECOG Performance status: 0  PHYSICAL EXAMINATION:   General:  A well-nourished woman in no acute distress.  There was no scleral icterus.  There were no oropharyngeal lesions.  Neck was supple.  Lymph node exam:  Negative cervical, supraclavicular or axillary adenopathy.  Lungs were clear bilaterally without wheezing or crackles.  Cardiac exam:  Regular rate and rhythm, S1/S2, without murmur, rub or gallop.  Abdomen was soft, flat, nontender, nondistended, without organomegaly.  There was no pedal edema.  Neuro exam was nonfocal. Skin exam was negative.  LABORATORY/RADIOLOGY DATA:  Lab Results  Component Value Date   WBC 5.7 04/30/2011   HGB 13.4 04/30/2011   HCT 39.1 04/30/2011   PLT 257.0 04/30/2011   GLUCOSE 95 04/30/2011   CHOL 248* 04/30/2011   TRIG 201.0* 04/30/2011   HDL  42.60 04/30/2011   LDLDIRECT 169.7 04/30/2011   ALKPHOS 132* 04/30/2011   ALT 31 04/30/2011   AST 27 04/30/2011   NA 141 04/30/2011   K 3.6 04/30/2011   CL 106 04/30/2011   CREATININE 0.9 04/30/2011   BUN 24* 04/30/2011   CO2 26 04/30/2011   INR 0.97 06/05/2010    ASSESSMENT AND PLAN:   1. Abnormal sacral MRI lesions:  Patient was referred for CT-guided sacral lesion biopsy and on the CT-guidance, there was no identifiable lesion to biopsy.  Therefore CT-guided biopsy of the sacral lesion was aborted.  Instead, she underwent a bone marrow biopsy which showed 6% plasma cells; however, these plasma cells did not configure in sheet formulation, nor were  they monoclonal.  Therefore the 6% plasma cells is a spurious incidental finding, without clinical significance such as myeloma.  At this time the most likely diagnosis for her sacral lesion is benign finding.  My recommendation is for referral to orthopedics if hip/sacral pain worsens.  Extensive workup including serum tumor was negative.  I went further by obtaining chest, abdomen and pelvis CT which was negative for occult malignancy. 2. Subcentimeter lung nodule in patient who quit smoking 12 years ago:  Most likely this old granulomatous disease. Lesions have been stable on CT scan x 1 year. Will order another CT of the chest in 1 year to ensure stability per radiologist recommendation. 3. Osteoarthritis:  She is on Dilaudid per Dr Sanda Linger.   4. Nasal drainage cough:  Most likely seasonal allergies; conservative management. 5. Follow up: In 1 year with a CT of the chest, CBC, CMET, and LDH prior to that visit.  The patient was seen and examined with Dr Gaylyn Rong. >90% of plan of care developed by Dr Gaylyn Rong. The length of time of the face-to-face encounter was 15  minutes. More than 50% of time was spent counseling and coordination of care.

## 2011-09-03 ENCOUNTER — Other Ambulatory Visit (INDEPENDENT_AMBULATORY_CARE_PROVIDER_SITE_OTHER): Payer: BC Managed Care – PPO

## 2011-09-03 ENCOUNTER — Ambulatory Visit (INDEPENDENT_AMBULATORY_CARE_PROVIDER_SITE_OTHER): Payer: BC Managed Care – PPO | Admitting: Internal Medicine

## 2011-09-03 ENCOUNTER — Encounter: Payer: Self-pay | Admitting: Internal Medicine

## 2011-09-03 VITALS — BP 136/76 | HR 106 | Temp 98.7°F | Resp 97 | Wt 150.0 lb

## 2011-09-03 DIAGNOSIS — J441 Chronic obstructive pulmonary disease with (acute) exacerbation: Secondary | ICD-10-CM

## 2011-09-03 DIAGNOSIS — R05 Cough: Secondary | ICD-10-CM

## 2011-09-03 DIAGNOSIS — I10 Essential (primary) hypertension: Secondary | ICD-10-CM

## 2011-09-03 DIAGNOSIS — J309 Allergic rhinitis, unspecified: Secondary | ICD-10-CM | POA: Insufficient documentation

## 2011-09-03 DIAGNOSIS — J449 Chronic obstructive pulmonary disease, unspecified: Secondary | ICD-10-CM

## 2011-09-03 DIAGNOSIS — E7800 Pure hypercholesterolemia, unspecified: Secondary | ICD-10-CM

## 2011-09-03 DIAGNOSIS — M1990 Unspecified osteoarthritis, unspecified site: Secondary | ICD-10-CM

## 2011-09-03 LAB — COMPREHENSIVE METABOLIC PANEL
ALT: 24 U/L (ref 0–35)
AST: 28 U/L (ref 0–37)
Albumin: 3.9 g/dL (ref 3.5–5.2)
BUN: 18 mg/dL (ref 6–23)
CO2: 28 mEq/L (ref 19–32)
Calcium: 9.3 mg/dL (ref 8.4–10.5)
Chloride: 106 mEq/L (ref 96–112)
Creatinine, Ser: 1 mg/dL (ref 0.4–1.2)
GFR: 75.99 mL/min (ref >60.00)
Potassium: 4 mEq/L (ref 3.5–5.1)

## 2011-09-03 LAB — LIPID PANEL
Cholesterol: 195 mg/dL (ref 0–200)
LDL Cholesterol: 123 mg/dL — ABNORMAL HIGH (ref 0–99)
Total CHOL/HDL Ratio: 4

## 2011-09-03 MED ORDER — MOMETASONE FUROATE 50 MCG/ACT NA SUSP: 2.0000 | Freq: Every day | NASAL | Status: DC

## 2011-09-03 MED ORDER — FEXOFENADINE HCL 180 MG PO TABS: 180.0000 mg | ORAL_TABLET | Freq: Every day | ORAL | Status: DC

## 2011-09-03 MED ORDER — DEXTROMETHORPHAN-GUAIFENESIN 10-100 MG/5ML PO LIQD: 5.0000 mL | ORAL | Status: AC | PRN

## 2011-09-03 MED ORDER — CELECOXIB 200 MG PO CAPS: 200.0000 mg | ORAL_CAPSULE | Freq: Every day | ORAL | Status: AC

## 2011-09-03 NOTE — Assessment & Plan Note (Signed)
Her BP is well controlled, I will check her lytes and renal function today 

## 2011-09-03 NOTE — Progress Notes (Signed)
Subjective:    Patient ID: Kelli Reyes, female    DOB: [birth year], 66 y.o.   MRN: [id number]  Cough This is a recurrent problem. The current episode started more than 1 year ago. The problem has been gradually worsening. The problem occurs every few hours. The cough is non-productive. Associated symptoms include nasal congestion, postnasal drip and rhinorrhea. Pertinent negatives include no chest pain, chills, ear congestion, ear pain, fever, headaches, heartburn, hemoptysis, myalgias, rash, sore throat, shortness of breath, sweats, weight loss or wheezing. The symptoms are aggravated by nothing. She has tried ipratropium inhaler, prescription cough suppressant and OTC cough suppressant for the symptoms. The treatment provided mild relief. Her past medical history is significant for COPD and emphysema.      Review of Systems  Constitutional: Negative for fever, chills, weight loss, diaphoresis, activity change, appetite change, fatigue and unexpected weight change.  HENT: Positive for congestion, rhinorrhea, sneezing and postnasal drip. Negative for hearing loss, ear pain, nosebleeds, sore throat, facial swelling, drooling, mouth sores, trouble swallowing, neck stiffness, dental problem, voice change, sinus pressure, tinnitus and ear discharge.   Eyes: Negative.   Respiratory: Positive for cough. Negative for apnea, hemoptysis, choking, shortness of breath, wheezing and stridor.   Cardiovascular: Negative for chest pain, palpitations and leg swelling.  Gastrointestinal: Negative for heartburn, nausea, vomiting, abdominal pain, diarrhea, constipation, abdominal distention and anal bleeding.  Genitourinary: Negative.   Musculoskeletal: Positive for arthralgias (bilateral shoulder pain). Negative for myalgias, back pain, joint swelling and gait problem.  Skin: Negative for color change, pallor, rash and wound.  Neurological: Negative for dizziness, tremors, seizures, syncope, facial asymmetry,  speech difficulty, weakness, light-headedness, numbness and headaches.  Hematological: Negative for adenopathy. Does not bruise/bleed easily.  Psychiatric/Behavioral: Negative.        Objective:   Physical Exam  Vitals reviewed. Constitutional: She is oriented to person, place, and time. She appears well-developed and well-nourished. No distress.  HENT:  Head: Normocephalic and atraumatic.  Right Ear: Hearing, tympanic membrane, external ear and ear canal normal.  Left Ear: Hearing, tympanic membrane, external ear and ear canal normal.  Nose: Nose normal. No mucosal edema, rhinorrhea, nose lacerations, sinus tenderness, nasal deformity, septal deviation or nasal septal hematoma. No epistaxis.  No foreign bodies. Right sinus exhibits no maxillary sinus tenderness and no frontal sinus tenderness. Left sinus exhibits no maxillary sinus tenderness and no frontal sinus tenderness.  Mouth/Throat: Oropharynx is clear and moist and mucous membranes are normal. Mucous membranes are not pale, not dry and not cyanotic. No oropharyngeal exudate, posterior oropharyngeal edema, posterior oropharyngeal erythema or tonsillar abscesses.  Eyes: Conjunctivae are normal. Right eye exhibits no discharge. Left eye exhibits no discharge. No scleral icterus.  Neck: Normal range of motion. Neck supple. No JVD present. No tracheal deviation present. No thyromegaly present.  Cardiovascular: Normal rate, regular rhythm, normal heart sounds and intact distal pulses.  Exam reveals no gallop and no friction rub.   No murmur heard. Pulmonary/Chest: Effort normal and breath sounds normal. No stridor. No respiratory distress. She has no wheezes. She has no rales. She exhibits no tenderness.  Abdominal: Soft. Bowel sounds are normal. She exhibits no distension and no mass. There is no tenderness. There is no rebound and no guarding.  Musculoskeletal: Normal range of motion. She exhibits no edema and no tenderness.    Lymphadenopathy:    She has no cervical adenopathy.  Neurological: She is oriented to person, place, and time.  Skin: Skin is warm and dry. No rash  noted. She is not diaphoretic. No erythema. No pallor.  Psychiatric: She has a normal mood and affect. Her behavior is normal. Judgment and thought content normal.     Lab Results  Component Value Date   WBC 5.7 04/30/2011   HGB 13.4 04/30/2011   HCT 39.1 04/30/2011   PLT 257.0 04/30/2011   GLUCOSE 95 04/30/2011   CHOL 248* 04/30/2011   TRIG 201.0* 04/30/2011   HDL 42.60 04/30/2011   LDLDIRECT 169.7 04/30/2011   ALT 31 04/30/2011   AST 27 04/30/2011   NA 141 04/30/2011   K 3.6 04/30/2011   CL 106 04/30/2011   CREATININE 0.9 04/30/2011   BUN 24* 04/30/2011   CO2 26 04/30/2011   TSH 0.96 04/30/2011   INR 0.97 06/05/2010  Ct Chest W Contrast  06/28/2011  *RADIOLOGY REPORT*  Clinical Data: Follow up pulmonary nodules  CT CHEST WITH CONTRAST  Technique:  Multidetector CT imaging of the chest was performed following the standard protocol during bolus administration of intravenous contrast.  Contrast:  80 ml Omnipaque-300 IV  Comparison: CT chest dated 06/19/2010  Findings: Numerous scattered small calcified and noncalcified pulmonary nodules, the largest noncalcified nodule measuring 6 mm in the central right upper lobe (series 3/image 18).  This appearance is unchanged from prior CT.  No new/suspicious pulmonary nodules. No pleural effusion or pneumothorax.  Visualized right thyroid gland is mildly nodular.  The heart is normal in size.  No pericardial effusion. Atherosclerotic calcifications of the aortic arch.  Extensive calcified mediastinal and bilateral hilar lymph nodes. Small axillary lymph nodes which do not meet pathologic CT size criteria.  The visualized upper abdomen is notable for calcified upper abdominal lymph nodes and a right upper pole cyst.  IMPRESSION: Numerous scattered calcified and noncalcified pulmonary nodules, the largest noncalcified nodule measuring 6 mm  in the right upper lobe, unchanged and likely benign.  Consider one additional follow- up CT in 12 months to document 2-year stability.  Calcified mediastinal, bilateral hilar, and upper abdominal lymphadenopathy, likely sequela of prior granulomatous disease.  Original Report Authenticated By: Charline Bills, M.D.      Assessment & Plan:

## 2011-09-03 NOTE — Assessment & Plan Note (Signed)
She is doing well on livalo, she has some joint pain so I have ordered a CPK level today to screen for myopathy

## 2011-09-03 NOTE — Assessment & Plan Note (Signed)
She has a persistent cough, I am concerned about her use of tussionex so I have stopped that and she will take tussin-dm for cough, I have asked her to see pulmonology for further advice about treating her symptoms, she will continue using spiriva, I will treat her nasal symptoms as it appears that the nasal drainage may be triggering her lung symptoms

## 2011-09-03 NOTE — Assessment & Plan Note (Signed)
pulm referral

## 2011-09-03 NOTE — Patient Instructions (Addendum)
Chronic Obstructive Pulmonary Disease Chronic obstructive pulmonary disease (COPD) is a condition in which airflow from the lungs is restricted. The lungs can never return to normal, but there are measures you can take which will improve them and make you feel better. CAUSES   Smoking.   Exposure to secondhand smoke.   Breathing in irritants (pollution, cigarette smoke, strong smells, aerosol sprays, paint fumes).   History of lung infections.  TREATMENT  Treatment focuses on making you comfortable (supportive care). Your caregiver may prescribe medications (inhaled or pills) to help improve your breathing. HOME CARE INSTRUCTIONS   If you smoke, stop smoking.   Avoid exposure to smoke, chemicals, and fumes that aggravate your breathing.   Take antibiotic medicines as directed by your caregiver.   Avoid medicines that dry up your system and slow down the elimination of secretions (antihistamines and cough syrups). This decreases respiratory capacity and may lead to infections.   Drink enough water and fluids to keep your urine clear or pale yellow. This loosens secretions.   Use humidifiers at home and at your bedside if they do not make breathing difficult.   Receive all protective vaccines your caregiver suggests, especially pneumococcal and influenza.   Use home oxygen as suggested.   Stay active. Exercise and physical activity will help maintain your ability to do things you want to do.   Eat a healthy diet.  SEEK MEDICAL CARE IF:   You develop pus-like mucus (sputum).   Breathing is more labored or exercise becomes difficult to do.   You are running out of the medicine you take for your breathing.  SEEK IMMEDIATE MEDICAL CARE IF:   You have a rapid heart rate.   You have agitation, confusion, tremors, or are in a stupor (family members may need to observe this).   It becomes difficult to breathe.   You develop chest pain.   You have a fever.  MAKE SURE YOU:    Understand these instructions.   Will watch your condition.   Will get help right away if you are not doing well or get worse.  Document Released: 12/20/2004 Document Revised: 03/01/2011 Document Reviewed: 05/12/2010 [facility] Patient Information 2012 [place], [place].Degenerative Arthritis You have osteoarthritis. This is the wear and tear arthritis that comes with aging. It is also called degenerative arthritis. This is common in people past middle age. It is caused by stress on the joints. The large weight bearing joints of the lower extremities are most often affected. The knees, hips, back, neck, and hands can become painful, swollen, and stiff. This is the most common type of arthritis. It comes on with age, carrying too much weight, or from an injury. Treatment includes resting the sore joint until the pain and swelling improve. Crutches or a walker may be needed for severe flares. Only take over-the-counter or prescription medicines for pain, discomfort, or fever as directed by your caregiver. Local heat therapy may improve motion. Cortisone shots into the joint are sometimes used to reduce pain and swelling during flares. Osteoarthritis is usually not crippling and progresses slowly. There are things you can do to decrease pain:  Avoid high impact activities.   Exercise regularly.   Low impact exercises such as walking, biking and swimming help to keep the muscles strong and keep normal joint function.   Stretching helps to keep your range of motion.   Lose weight if you are overweight. This reduces joint stress.  In severe cases when you have pain at  rest or increasing disability, joint surgery may be helpful. See your caregiver for follow-up treatment as recommended.  SEEK IMMEDIATE MEDICAL CARE IF:   You have severe joint pain.   Marked swelling and redness in your joint develops.   You develop a high fever.  Document Released: 03/12/2005 Document Revised: 03/01/2011  Document Reviewed: 08/12/2006 [facility] Patient Information 2012 [place], [place].

## 2011-09-03 NOTE — Assessment & Plan Note (Signed)
Restart allegra and start nasonex ns

## 2011-09-03 NOTE — Assessment & Plan Note (Signed)
She will try celebrex for joint pain

## 2011-09-03 NOTE — Assessment & Plan Note (Signed)
As above.

## 2011-09-28 ENCOUNTER — Institutional Professional Consult (permissible substitution): Payer: BC Managed Care – PPO | Admitting: Internal Medicine

## 2011-10-03 ENCOUNTER — Other Ambulatory Visit: Payer: Self-pay

## 2011-10-03 MED ORDER — HYDROMORPHONE HCL 2 MG PO TABS: 2.0000 mg | ORAL_TABLET | Freq: Four times a day (QID) | ORAL | Status: DC | PRN

## 2011-10-03 NOTE — Telephone Encounter (Signed)
Notified pt rx ready for pick-up. Put in cabinet up front... 10/03/11@1 :24pm/LMB

## 2011-10-05 ENCOUNTER — Encounter: Payer: Self-pay | Admitting: Internal Medicine

## 2011-10-05 ENCOUNTER — Ambulatory Visit (INDEPENDENT_AMBULATORY_CARE_PROVIDER_SITE_OTHER): Payer: BC Managed Care – PPO | Admitting: Internal Medicine

## 2011-10-05 VITALS — BP 106/78 | HR 95 | Temp 98.5°F | Ht 62.0 in | Wt 151.8 lb

## 2011-10-05 DIAGNOSIS — J4489 Other specified chronic obstructive pulmonary disease: Secondary | ICD-10-CM

## 2011-10-05 DIAGNOSIS — R918 Other nonspecific abnormal finding of lung field: Secondary | ICD-10-CM

## 2011-10-05 DIAGNOSIS — R059 Cough, unspecified: Secondary | ICD-10-CM

## 2011-10-05 DIAGNOSIS — J449 Chronic obstructive pulmonary disease, unspecified: Secondary | ICD-10-CM

## 2011-10-05 DIAGNOSIS — R05 Cough: Secondary | ICD-10-CM

## 2011-10-05 DIAGNOSIS — R942 Abnormal results of pulmonary function studies: Secondary | ICD-10-CM

## 2011-10-05 MED ORDER — FAMOTIDINE 20 MG PO TABS: ORAL_TABLET | ORAL | Status: DC

## 2011-10-05 NOTE — Patient Instructions (Addendum)
Continue prilosec 20 mg Take 30-60 min before first meal of the day and ADD Pepcid 20 mg one at bedtime  GERD (REFLUX)  is an extremely common cause of respiratory symptoms like your cough, many times with no significant heartburn at all.    It can be treated with medication, but also with lifestyle changes including avoidance of late meals, excessive alcohol, smoking cessation, and avoid fatty foods, chocolate, peppermint, colas, red wine, and acidic juices such as orange juice.  NO MINT OR MENTHOL PRODUCTS SO NO COUGH DROPS  USE SUGARLESS CANDY INSTEAD (jolley ranchers or Stover's)  NO OIL BASED VITAMINS - use powdered substitutes.    Please schedule a follow up office visit in 4- 6 weeks, call sooner if needed with PFT's .

## 2011-10-05 NOTE — Progress Notes (Signed)
  Subjective:    Patient ID: Kelli Reyes, female    DOB: 19-Oct-1945   MRN: [id number]  HPI  8 yobf quit smoking 1998 with onset of cough / sob in 1995 and not consistently better since so referred by Dr Yetta Barre 10/05/2011 to pulmonary clinic.   10/05/2011 1st pulmonary eval cc chronic doe x over a decade min progressive x 2 -3 blocks some better since spiriva and mostly dry cough worse but improved on nasonex.   Typically wakes up about 3am nightly then takes spriva, does better p drinking water. No seasonal variability, h/o asthma or childhood allergies.  No  , purulent sputum or sinus/hb symptoms on present rx.  no need for noct saba. Also denies any obvious fluctuation of symptoms with weather or environmental changes or other aggravating or alleviating factors except as outlined above       Review of Systems  Constitutional: Negative for fever and unexpected weight change.  HENT: Positive for rhinorrhea and postnasal drip. Negative for ear pain, nosebleeds, congestion, sore throat, sneezing, trouble swallowing, dental problem and sinus pressure.   Eyes: Negative for redness and itching.  Respiratory: Positive for cough, shortness of breath and wheezing. Negative for chest tightness.   Cardiovascular: Negative for chest pain, palpitations and leg swelling.  Gastrointestinal: Negative for nausea and vomiting.  Genitourinary: Negative for dysuria.  Musculoskeletal: Negative for joint swelling.  Skin: Negative for rash.  Neurological: Negative for headaches.  Hematological: Does not bruise/bleed easily.  Psychiatric/Behavioral: Negative for dysphoric mood. The patient is not nervous/anxious.        Objective:   Physical Exam  amb bf nad Wt 151 10/06/2011  HEENT mild turbinate edema.  Oropharynx no thrush or excess pnd or cobblestoning.  No JVD or cervical adenopathy. Min accessory muscle hypertrophy. Trachea midline, nl thryroid. Chest was mildly hyperinflated by percussion with  mildly diminished breath sounds and moderate increased exp time without wheeze. Hoover sign positive at mid to late inspiration. Regular rate and rhythm without murmur gallop or rub or increase P2 or edema.  Abd: no hsm, nl excursion. Ext warm without cyanosis or clubbing.    Ct chest 06/28/11  Numerous scattered calcified and noncalcified pulmonary nodules,  the largest noncalcified nodule measuring 6 mm in the right upper  lobe, unchanged and likely benign. Consider one additional follow-  up CT in 12 months to document 2-year stability.  Calcified mediastinal, bilateral hilar, and upper abdominal  lymphadenopathy, likely sequela of prior granulomatous disease      Assessment & Plan:

## 2011-10-06 NOTE — Assessment & Plan Note (Signed)
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  She might do better with LABA/ICS like advair but because of the prominent cough would avoid more dpi here until returns for ov with fresh set of pfts

## 2011-10-06 NOTE — Assessment & Plan Note (Signed)
CT 06/28/11 reviewed and c/w prior sarcoid, no further CT's needed in this setting.

## 2011-10-06 NOTE — Assessment & Plan Note (Signed)
The most common causes of chronic cough in immunocompetent adults include the following: upper airway cough syndrome (UACS), previously referred to as postnasal drip syndrome (PNDS), which is caused by variety of rhinosinus conditions; (2) asthma; (3) GERD; (4) chronic bronchitis from cigarette smoking or other inhaled environmental irritants; (5) nonasthmatic eosinophilic bronchitis; and (6) bronchiectasis.   These conditions, singly or in combination, have accounted for up to 94% of the causes of chronic cough in prospective studies.   Other conditions have constituted no >6% of the causes in prospective studies These have included bronchogenic carcinoma, chronic interstitial pneumonia, sarcoidosis, left ventricular failure, ACEI-induced cough, and aspiration from a condition associated with pharyngeal dysfunction.   The nocturnal cough is either an asthmatic component or  Classic Upper airway cough syndrome, so named because it's frequently impossible to sort out how much is  CR/sinusitis with freq throat clearing (which can be related to primary GERD)   vs  causing  secondary (" extra esophageal")  GERD from wide swings in gastric pressure that occur with throat clearing, often  promoting self use of mint and menthol lozenges that reduce the lower esophageal sphincter tone and exacerbate the problem further in a cyclical fashion.   These are the same pts (now being labeled as having "irritable larynx syndrome" by some cough centers) who not infrequently have a history of having failed to tolerate ace inhibitors,  dry powder inhalers or biphosphonates or report having atypical reflux symptoms that don't respond to standard doses of PPI , and are easily confused as having aecopd or asthma flares by even experienced allergists/ pulmonologists.   For now try just adding pepcid 20 mg at hs and regroup in 4 weeks to determine whether additional w/u or rx needed.

## 2011-10-15 ENCOUNTER — Ambulatory Visit: Payer: BC Managed Care – PPO | Admitting: Internal Medicine

## 2011-10-15 DIAGNOSIS — Z0289 Encounter for other administrative examinations: Secondary | ICD-10-CM

## 2011-11-16 ENCOUNTER — Ambulatory Visit: Payer: BC Managed Care – PPO | Admitting: Internal Medicine

## 2011-11-16 ENCOUNTER — Encounter: Payer: BC Managed Care – PPO | Admitting: Internal Medicine

## 2011-11-16 DIAGNOSIS — Z0289 Encounter for other administrative examinations: Secondary | ICD-10-CM

## 2011-11-16 NOTE — Progress Notes (Signed)
 This encounter was created in error - please disregard.

## 2012-01-03 ENCOUNTER — Telehealth: Payer: Self-pay | Admitting: Internal Medicine

## 2012-01-03 MED ORDER — HYDROMORPHONE HCL 2 MG PO TABS: 2.0000 mg | ORAL_TABLET | Freq: Four times a day (QID) | ORAL | Status: DC | PRN

## 2012-01-03 NOTE — Telephone Encounter (Signed)
The patient called and is hoping to get a refill of her Dilaudid (generic) sent to [facility].  Pt callback- [phone]

## 2012-01-03 NOTE — Telephone Encounter (Signed)
done

## 2012-02-23 ENCOUNTER — Encounter (HOSPITAL_COMMUNITY): Payer: Self-pay | Admitting: *Deleted

## 2012-02-23 ENCOUNTER — Emergency Department (HOSPITAL_COMMUNITY)
Admission: EM | Admit: 2012-02-23 | Discharge: 2012-02-24 | Disposition: A | Discharge status: Home / Self Care | Payer: BC Managed Care – PPO | Attending: Emergency Medicine | Admitting: Emergency Medicine

## 2012-02-23 DIAGNOSIS — Z79899 Other long term (current) drug therapy: Secondary | ICD-10-CM | POA: Insufficient documentation

## 2012-02-23 DIAGNOSIS — R109 Unspecified abdominal pain: Secondary | ICD-10-CM

## 2012-02-23 DIAGNOSIS — J4489 Other specified chronic obstructive pulmonary disease: Secondary | ICD-10-CM | POA: Insufficient documentation

## 2012-02-23 DIAGNOSIS — Z87891 Personal history of nicotine dependence: Secondary | ICD-10-CM | POA: Insufficient documentation

## 2012-02-23 DIAGNOSIS — R1084 Generalized abdominal pain: Secondary | ICD-10-CM | POA: Insufficient documentation

## 2012-02-23 DIAGNOSIS — K219 Gastro-esophageal reflux disease without esophagitis: Secondary | ICD-10-CM | POA: Insufficient documentation

## 2012-02-23 DIAGNOSIS — M1990 Unspecified osteoarthritis, unspecified site: Secondary | ICD-10-CM | POA: Insufficient documentation

## 2012-02-23 DIAGNOSIS — R112 Nausea with vomiting, unspecified: Secondary | ICD-10-CM | POA: Insufficient documentation

## 2012-02-23 DIAGNOSIS — I10 Essential (primary) hypertension: Secondary | ICD-10-CM | POA: Insufficient documentation

## 2012-02-23 DIAGNOSIS — J449 Chronic obstructive pulmonary disease, unspecified: Secondary | ICD-10-CM | POA: Insufficient documentation

## 2012-02-23 LAB — URINALYSIS, ROUTINE W REFLEX MICROSCOPIC
Bilirubin Urine: NEGATIVE
Ketones, ur: NEGATIVE mg/dL
Leukocytes, UA: NEGATIVE
Nitrite: NEGATIVE
Protein, ur: 100 mg/dL — AB
Urobilinogen, UA: 1 mg/dL (ref 0.0–1.0)

## 2012-02-23 LAB — URINE MICROSCOPIC-ADD ON

## 2012-02-23 NOTE — ED Notes (Signed)
Pt c/o abd pain for 2 hours with nv.  No recent food to suspect food poisoning. lmp

## 2012-02-24 LAB — COMPREHENSIVE METABOLIC PANEL
ALT: 28 U/L (ref 0–35)
Alkaline Phosphatase: 173 U/L — ABNORMAL HIGH (ref 39–117)
BUN: 14 mg/dL (ref 6–23)
CO2: 20 mEq/L (ref 19–32)
GFR calc Af Amer: >90 mL/min (ref >90)
GFR calc non Af Amer: 86 mL/min — ABNORMAL LOW (ref >90)
Glucose, Bld: 200 mg/dL — ABNORMAL HIGH (ref 70–99)
Potassium: 3.8 mEq/L (ref 3.5–5.1)
Sodium: 137 mEq/L (ref 135–145)
Total Bilirubin: 0.5 mg/dL (ref 0.3–1.2)
Total Protein: 8.7 g/dL — ABNORMAL HIGH (ref 6.0–8.3)

## 2012-02-24 LAB — CBC WITH DIFFERENTIAL/PLATELET
Eosinophils Absolute: 0.1 10*3/uL (ref 0.0–0.7)
Hemoglobin: 15.8 g/dL — ABNORMAL HIGH (ref 12.0–15.0)
Lymphocytes Relative: 9 % — ABNORMAL LOW (ref 12–46)
Lymphs Abs: 0.9 10*3/uL (ref 0.7–4.0)
MCH: 30.3 pg (ref 26.0–34.0)
MCV: 89.1 fL (ref 78.0–100.0)
Monocytes Relative: 3 % (ref 3–12)
Neutrophils Relative %: 86 % — ABNORMAL HIGH (ref 43–77)
Platelets: 229 10*3/uL (ref 150–400)
RBC: 5.21 MIL/uL — ABNORMAL HIGH (ref 3.87–5.11)
WBC: 10.2 10*3/uL (ref 4.0–10.5)

## 2012-02-24 MED ORDER — ONDANSETRON HCL 4 MG/2ML IJ SOLN
4.0000 mg | Freq: Once | INTRAMUSCULAR | Status: AC
  Administered 2012-02-24: 4 mg via INTRAVENOUS
  Filled 2012-02-24: qty 2

## 2012-02-24 MED ORDER — SODIUM CHLORIDE 0.9 % IV BOLUS (SEPSIS)
500.0000 mL | Freq: Once | INTRAVENOUS | Status: AC
  Administered 2012-02-24: 500 mL via INTRAVENOUS

## 2012-02-24 MED ORDER — ONDANSETRON HCL 4 MG PO TABS: 8.0000 mg | ORAL_TABLET | Freq: Four times a day (QID) | ORAL | Status: DC

## 2012-02-24 MED ORDER — METOCLOPRAMIDE HCL 10 MG PO TABS: 10.0000 mg | ORAL_TABLET | Freq: Four times a day (QID) | ORAL | Status: DC

## 2012-02-24 MED ORDER — HYDROCODONE-ACETAMINOPHEN 5-325 MG PO TABS: 1.0000 | ORAL_TABLET | Freq: Four times a day (QID) | ORAL | Status: DC | PRN

## 2012-02-24 MED ORDER — HYDROMORPHONE HCL PF 1 MG/ML IJ SOLN
1.0000 mg | Freq: Once | INTRAMUSCULAR | Status: AC
  Administered 2012-02-24: 1 mg via INTRAVENOUS
  Filled 2012-02-24: qty 1

## 2012-02-24 NOTE — ED Provider Notes (Signed)
Medical screening examination/treatment/procedure(s) were conducted as a shared visit with non-physician practitioner(s) and myself.  I personally evaluated the patient during the encounter, several hours diffuse crampy abd pain emesis passing flatus, pain and nausea gone in ED, minimal lower abdominal tenderness RLQ/LLQ/suprapubic without rebound plan recheck ED 8-12 hours if not resolved.  Hurman Horn, MD 02/25/12 [phone]

## 2012-02-24 NOTE — ED Notes (Signed)
Pt denies any pain or questions upon discharge. 

## 2012-02-24 NOTE — ED Notes (Signed)
Pt reports having decrease in abdominal pain from 10/10-1/10 at this time. Will continue to monitor pt.

## 2012-02-24 NOTE — ED Provider Notes (Signed)
History     CSN: [id number]  Arrival date & time 02/23/12  2226   First MD Initiated Contact with Patient 02/24/12 0022      Chief Complaint  Patient presents with  . Abdominal Pain    (Consider location/radiation/quality/duration/timing/severity/associated sxs/prior treatment) HPI Comments: Patient presents with complaint of diffuse abdominal pain 8/10, nausea and vomiting X 2. Patient states that she had some chicken broth and crackers and the pain began immediately after. Denies fever or chills. Denies diarrhea or constipation. Last bowel movement was yesterday and normal. Denies hematemesis.   The history is provided by the patient. No language interpreter was used.    Past Medical History  Diagnosis Date  . COPD (chronic obstructive pulmonary disease)   . GERD (gastroesophageal reflux disease)   . Hypertension   . Osteoarthritis     Past Surgical History  Procedure Date  . Abdominal hysterectomy     Family History  Problem Relation Age of Onset  . Hyperlipidemia Other   . Cancer Neg Hx   . Stroke Neg Hx   . Heart disease Neg Hx     History  Substance Use Topics  . Smoking status: Former Smoker -- 2.0 packs/day for 25 years    Types: Cigarettes    Quit date: 09/03/1996  . Smokeless tobacco: Never Used  . Alcohol Use: 3.0 oz/week    5 Glasses of wine per week     Comment: beer 2-3 times a week    OB History    Grav Para Term Preterm Abortions TAB SAB Ect Mult Living                  Review of Systems  Constitutional: Negative for fever and chills.  Gastrointestinal: Positive for nausea, vomiting and abdominal pain. Negative for diarrhea and constipation.    Allergies  Ibuprofen  Home Medications   Current Outpatient Rx  Name  Route  Sig  Dispense  Refill  . FAMOTIDINE 20 MG PO TABS      One at bedtime   30 tablet   11   . FEXOFENADINE HCL 180 MG PO TABS   Oral   Take 1 tablet (180 mg total) by mouth daily.   90 tablet   3   .  GUAIFENESIN 100 MG/5ML PO SYRP   Oral   Take 200 mg by mouth 3 (three) times daily as needed. Cough syrup         . MOMETASONE FUROATE 50 MCG/ACT NA SUSP   Nasal   Place 2 sprays into the nose daily.   17 g   12   . OLMESARTAN MEDOXOMIL 40 MG PO TABS   Oral   Take 40 mg by mouth daily.         Marland Kitchen PITAVASTATIN CALCIUM 4 MG PO TABS   Oral   Take 1 tablet (4 mg total) by mouth daily.   90 tablet   3   . COLD TABLETS PO   Oral   Take 1 tablet by mouth every 6 (six) hours as needed. For cold         . TIOTROPIUM BROMIDE MONOHYDRATE 18 MCG IN CAPS   Inhalation   Place 18 mcg into inhaler and inhale daily.            BP 157/109  Pulse 96  Temp 98.3 F (36.8 C) (Oral)  Resp 20  Ht 5\' 2"  (1.575 m)  Wt 148 lb (67.132 kg)  BMI 27.07 kg/m2  SpO2 94%  Physical Exam  Constitutional: She appears well-developed and well-nourished.  HENT:  Head: Normocephalic and atraumatic.  Mouth/Throat: Oropharynx is clear and moist.  Eyes: Conjunctivae normal and EOM are normal. No scleral icterus.  Cardiovascular: Normal rate, regular rhythm and normal heart sounds.   Pulmonary/Chest: Effort normal and breath sounds normal.  Abdominal: Soft. Bowel sounds are normal. She exhibits no distension and no mass. There is tenderness. There is no rebound and no guarding.       Patient diffusely tender to palpation of the abdomen.  Neurological: She is alert.  Skin: Skin is warm and dry.    ED Course  Procedures (including critical care time)  Labs Reviewed  CBC WITH DIFFERENTIAL - Abnormal; Notable for the following:    RBC 5.21 (*)     Hemoglobin 15.8 (*)     HCT 46.4 (*)     Neutrophils Relative 86 (*)     Neutro Abs 8.8 (*)     Lymphocytes Relative 9 (*)     All other components within normal limits  URINALYSIS, ROUTINE W REFLEX MICROSCOPIC - Abnormal; Notable for the following:    APPearance CLOUDY (*)     Specific Gravity, Urine 1.034 (*)     Protein, ur 100 (*)     All  other components within normal limits  URINE MICROSCOPIC-ADD ON - Abnormal; Notable for the following:    Squamous Epithelial / LPF MANY (*)     Bacteria, UA FEW (*)     All other components within normal limits  COMPREHENSIVE METABOLIC PANEL   Results for orders placed during the hospital encounter of 02/23/12  CBC WITH DIFFERENTIAL      Component Value Range   WBC 10.2  4.0 - 10.5 K/uL   RBC 5.21 (*) 3.87 - 5.11 MIL/uL   Hemoglobin 15.8 (*) 12.0 - 15.0 g/dL   HCT 45.4 (*) 09.8 - 11.9 %   MCV 89.1  78.0 - 100.0 fL   MCH 30.3  26.0 - 34.0 pg   MCHC 34.1  30.0 - 36.0 g/dL   RDW 14.7  82.9 - 56.2 %   Platelets 229  150 - 400 K/uL   Neutrophils Relative 86 (*) 43 - 77 %   Neutro Abs 8.8 (*) 1.7 - 7.7 K/uL   Lymphocytes Relative 9 (*) 12 - 46 %   Lymphs Abs 0.9  0.7 - 4.0 K/uL   Monocytes Relative 3  3 - 12 %   Monocytes Absolute 0.3  0.1 - 1.0 K/uL   Eosinophils Relative 1  0 - 5 %   Eosinophils Absolute 0.1  0.0 - 0.7 K/uL   Basophils Relative 0  0 - 1 %   Basophils Absolute 0.0  0.0 - 0.1 K/uL  COMPREHENSIVE METABOLIC PANEL      Component Value Range   Sodium 137  135 - 145 mEq/L   Potassium 3.8  3.5 - 5.1 mEq/L   Chloride 101  96 - 112 mEq/L   CO2 20  19 - 32 mEq/L   Glucose, Bld 200 (*) 70 - 99 mg/dL   BUN 14  6 - 23 mg/dL   Creatinine, Ser 1.30  0.50 - 1.10 mg/dL   Calcium 9.9  8.4 - 86.5 mg/dL   Total Protein 8.7 (*) 6.0 - 8.3 g/dL   Albumin 4.1  3.5 - 5.2 g/dL   AST 26  0 - 37 U/L   ALT 28  0 - 35 U/L  Alkaline Phosphatase 173 (*) 39 - 117 U/L   Total Bilirubin 0.5  0.3 - 1.2 mg/dL   GFR calc non Af Amer 86 (*) >90 mL/min   GFR calc Af Amer >90  >90 mL/min  URINALYSIS, ROUTINE W REFLEX MICROSCOPIC      Component Value Range   Color, Urine YELLOW  YELLOW   APPearance CLOUDY (*) CLEAR   Specific Gravity, Urine 1.034 (*) 1.005 - 1.030   pH 5.5  5.0 - 8.0   Glucose, UA NEGATIVE  NEGATIVE mg/dL   Hgb urine dipstick NEGATIVE  NEGATIVE   Bilirubin Urine NEGATIVE   NEGATIVE   Ketones, ur NEGATIVE  NEGATIVE mg/dL   Protein, ur 045 (*) NEGATIVE mg/dL   Urobilinogen, UA 1.0  0.0 - 1.0 mg/dL   Nitrite NEGATIVE  NEGATIVE   Leukocytes, UA NEGATIVE  NEGATIVE  URINE MICROSCOPIC-ADD ON      Component Value Range   Squamous Epithelial / LPF MANY (*) RARE   RBC / HPF 0-2  <3 RBC/hpf   Bacteria, UA FEW (*) RARE  LIPASE, BLOOD      Component Value Range   Lipase 35  11 - 59 U/L    No results found.   1. Abdominal pain       MDM  Patient presented with complaint of abdominal pain and nausea. Given pain medication and zofran with improvement. Labs unremarkable. Patient visit shared with Dr. Fonnie Jarvis who recommends recheck if pain unresolved.Discharged with short course of pain medication, zofran, and instructions to return in 8-12 hours for abdomen recheck. Patient understands this plan.        Pixie Casino, PA-C 02/24/12 0206

## 2012-02-25 ENCOUNTER — Ambulatory Visit (INDEPENDENT_AMBULATORY_CARE_PROVIDER_SITE_OTHER)
Admission: RE | Admit: 2012-02-25 | Discharge: 2012-02-25 | Disposition: A | Discharge status: Home / Self Care | Payer: BC Managed Care – PPO | Source: Ambulatory Visit | Attending: Internal Medicine | Admitting: Internal Medicine

## 2012-02-25 ENCOUNTER — Encounter: Payer: Self-pay | Admitting: Internal Medicine

## 2012-02-25 ENCOUNTER — Ambulatory Visit (INDEPENDENT_AMBULATORY_CARE_PROVIDER_SITE_OTHER): Payer: BC Managed Care – PPO | Admitting: Internal Medicine

## 2012-02-25 VITALS — BP 146/88 | HR 102 | Temp 98.4°F | Resp 16 | Wt 151.2 lb

## 2012-02-25 DIAGNOSIS — R739 Hyperglycemia, unspecified: Secondary | ICD-10-CM | POA: Insufficient documentation

## 2012-02-25 DIAGNOSIS — I10 Essential (primary) hypertension: Secondary | ICD-10-CM

## 2012-02-25 DIAGNOSIS — Z23 Encounter for immunization: Secondary | ICD-10-CM

## 2012-02-25 DIAGNOSIS — R109 Unspecified abdominal pain: Secondary | ICD-10-CM

## 2012-02-25 DIAGNOSIS — R10817 Generalized abdominal tenderness: Secondary | ICD-10-CM

## 2012-02-25 DIAGNOSIS — G894 Chronic pain syndrome: Secondary | ICD-10-CM

## 2012-02-25 DIAGNOSIS — R7309 Other abnormal glucose: Secondary | ICD-10-CM

## 2012-02-25 MED ORDER — GUAIFENESIN 100 MG/5ML PO SYRP: 200.0000 mg | ORAL_SOLUTION | Freq: Three times a day (TID) | ORAL | Status: DC | PRN

## 2012-02-25 NOTE — Assessment & Plan Note (Signed)
Her BP is a little high today and she tells me that she has been taking decongestants so I have asked her to stop the decongestants and will recheck her BP soon

## 2012-02-25 NOTE — Patient Instructions (Signed)
Abdominal Pain  Abdominal pain can be caused by many things. Your caregiver decides the seriousness of your pain by an examination and possibly blood tests and X-rays. Many cases can be observed and treated at home. Most abdominal pain is not caused by a disease and will probably improve without treatment. However, in many cases, more time must pass before a clear cause of the pain can be found. Before that point, it may not be known if you need more testing, or if hospitalization or surgery is needed.  HOME CARE INSTRUCTIONS   · Do not take laxatives unless directed by your caregiver.  · Take pain medicine only as directed by your caregiver.  · Only take over-the-counter or prescription medicines for pain, discomfort, or fever as directed by your caregiver.  · Try a clear liquid diet (broth, tea, or water) for as long as directed by your caregiver. Slowly move to a bland diet as tolerated.  SEEK IMMEDIATE MEDICAL CARE IF:   · The pain does not go away.  · You have a fever.  · You keep throwing up (vomiting).  · The pain is felt only in portions of the abdomen. Pain in the right side could possibly be appendicitis. In an adult, pain in the left lower portion of the abdomen could be colitis or diverticulitis.  · You pass bloody or black tarry stools.  MAKE SURE YOU:   · Understand these instructions.  · Will watch your condition.  · Will get help right away if you are not doing well or get worse.  Document Released: 12/20/2004 Document Revised: 06/04/2011 Document Reviewed: 10/29/2007  Exit[employer]® Patient Information ©2013 [employer].

## 2012-02-25 NOTE — Progress Notes (Signed)
Subjective:    Patient ID: Kelli Reyes, female    DOB: [birth year], 66 y.o.   MRN: [id number]  Abdominal Pain This is a new problem. Episode onset: for 3 days. The onset quality is gradual. The problem occurs intermittently. The problem has been gradually improving. The pain is located in the LUQ and RUQ. The pain is at a severity of 1/10. The pain is mild. The quality of the pain is aching. The abdominal pain does not radiate. Pertinent negatives include no anorexia, arthralgias, belching, constipation, diarrhea, dysuria, fever, flatus, frequency, headaches, hematochezia, hematuria, melena, myalgias, nausea, vomiting or weight loss. Nothing aggravates the pain. The pain is relieved by bowel movements. She has tried nothing for the symptoms. The treatment provided moderate relief.      Review of Systems  Constitutional: Positive for unexpected weight change (some weight gain). Negative for fever, chills, weight loss, diaphoresis, activity change, appetite change and fatigue.  HENT: Negative.   Eyes: Negative.   Respiratory: Negative for cough, chest tightness, shortness of breath, wheezing and stridor.   Cardiovascular: Negative for chest pain, palpitations and leg swelling.  Gastrointestinal: Positive for abdominal pain. Negative for nausea, vomiting, diarrhea, constipation, blood in stool, melena, hematochezia, abdominal distention, anal bleeding, rectal pain, anorexia and flatus.  Genitourinary: Negative for dysuria, urgency, frequency, hematuria, flank pain, decreased urine volume, vaginal bleeding, enuresis, difficulty urinating and dyspareunia.  Musculoskeletal: Negative for myalgias, back pain, joint swelling, arthralgias and gait problem.  Skin: Negative for color change, pallor, rash and wound.  Neurological: Negative for dizziness, tremors, seizures, syncope, facial asymmetry, speech difficulty, weakness, light-headedness, numbness and headaches.  Hematological: Negative for  adenopathy. Does not bruise/bleed easily.  Psychiatric/Behavioral: Negative.        Objective:   Physical Exam  Vitals reviewed. Constitutional: She is oriented to person, place, and time. She appears well-developed and well-nourished.  Non-toxic appearance. She does not have a sickly appearance. She does not appear ill. No distress.  HENT:  Head: Normocephalic and atraumatic.  Mouth/Throat: Oropharynx is clear and moist. No oropharyngeal exudate.  Eyes: Conjunctivae normal are normal. Right eye exhibits no discharge. Left eye exhibits no discharge. No scleral icterus.  Neck: Normal range of motion. Neck supple. No JVD present. No tracheal deviation present. No thyromegaly present.  Cardiovascular: Normal rate, regular rhythm, normal heart sounds and intact distal pulses.  Exam reveals no gallop and no friction rub.   No murmur heard. Pulmonary/Chest: Effort normal and breath sounds normal. No stridor. No respiratory distress. She has no wheezes. She has no rales. She exhibits no tenderness.  Abdominal: Soft. Normal appearance. She exhibits no shifting dullness, no distension, no fluid wave, no abdominal bruit, no ascites, no pulsatile midline mass and no mass. Bowel sounds are decreased. There is no hepatosplenomegaly, splenomegaly or hepatomegaly. There is tenderness in the right upper quadrant and left upper quadrant. There is no rigidity, no rebound, no guarding, no CVA tenderness, no tenderness at McBurney's point and negative Murphy's sign. No hernia. Hernia confirmed negative in the ventral area, confirmed negative in the right inguinal area and confirmed negative in the left inguinal area.  Genitourinary: Rectum normal. Rectal exam shows no external hemorrhoid, no internal hemorrhoid, no fissure, no mass, no tenderness and anal tone normal. Guaiac negative stool.  Musculoskeletal: Normal range of motion. She exhibits no edema and no tenderness.  Lymphadenopathy:    She has no cervical  adenopathy.  Neurological: She is oriented to person, place, and time.  Skin: Skin is warm  and dry. No rash noted. She is not diaphoretic. No erythema. No pallor.  Psychiatric: She has a normal mood and affect. Her behavior is normal. Judgment and thought content normal.      Lab Results  Component Value Date   WBC 10.2 02/23/2012   HGB 15.8* 02/23/2012   HCT 46.4* 02/23/2012   PLT 229 02/23/2012   GLUCOSE 200* 02/23/2012   CHOL 195 09/03/2011   TRIG 116.0 09/03/2011   HDL 49.10 09/03/2011   LDLDIRECT 169.7 04/30/2011   LDLCALC 123* 09/03/2011   ALT 28 02/23/2012   AST 26 02/23/2012   NA 137 02/23/2012   K 3.8 02/23/2012   CL 101 02/23/2012   CREATININE 0.77 02/23/2012   BUN 14 02/23/2012   CO2 20 02/23/2012   TSH 0.96 04/30/2011   INR 0.97 06/05/2010      Assessment & Plan:

## 2012-02-25 NOTE — Assessment & Plan Note (Signed)
I will check her UDS to see that she is compliant with listed meds and to screen for illicit drug abuse

## 2012-02-25 NOTE — Assessment & Plan Note (Signed)
I will check her UDS today

## 2012-02-25 NOTE — Addendum Note (Signed)
Addended by: Etta Grandchild on: 02/25/2012 01:57 PM   Modules accepted: Orders

## 2012-02-25 NOTE — Assessment & Plan Note (Signed)
I will check her a1c today to see if she has developed DM II 

## 2012-02-25 NOTE — Assessment & Plan Note (Signed)
Labs reviewed from ER visit 2 days ago, her pain does not appear to be an acute abd process, more like constipation/IBS/dyspepsia, I will check her plain films today to see if she has a large stool burden/sbo/ileus/etc. She was given several Rx's after her ER visit and she has not filled one of them, her symptoms for the most part have resolved.

## 2012-03-10 ENCOUNTER — Other Ambulatory Visit (INDEPENDENT_AMBULATORY_CARE_PROVIDER_SITE_OTHER): Payer: BC Managed Care – PPO

## 2012-03-10 ENCOUNTER — Encounter: Payer: Self-pay | Admitting: Internal Medicine

## 2012-03-10 ENCOUNTER — Ambulatory Visit (INDEPENDENT_AMBULATORY_CARE_PROVIDER_SITE_OTHER): Payer: BC Managed Care – PPO | Admitting: Internal Medicine

## 2012-03-10 ENCOUNTER — Other Ambulatory Visit: Payer: BC Managed Care – PPO

## 2012-03-10 VITALS — BP 120/80 | HR 80 | Temp 98.7°F | Resp 16 | Wt 152.0 lb

## 2012-03-10 DIAGNOSIS — R7309 Other abnormal glucose: Secondary | ICD-10-CM

## 2012-03-10 DIAGNOSIS — IMO0002 Reserved for concepts with insufficient information to code with codable children: Secondary | ICD-10-CM

## 2012-03-10 DIAGNOSIS — G894 Chronic pain syndrome: Secondary | ICD-10-CM

## 2012-03-10 DIAGNOSIS — R109 Unspecified abdominal pain: Secondary | ICD-10-CM

## 2012-03-10 LAB — BASIC METABOLIC PANEL
BUN: 22 mg/dL (ref 6–23)
CO2: 24 mEq/L (ref 19–32)
Chloride: 101 mEq/L (ref 96–112)
Glucose, Bld: 105 mg/dL — ABNORMAL HIGH (ref 70–99)
Potassium: 3.6 mEq/L (ref 3.5–5.1)

## 2012-03-10 MED ORDER — HYDROCODONE-ACETAMINOPHEN 5-325 MG PO TABS: 1.0000 | ORAL_TABLET | Freq: Four times a day (QID) | ORAL | Status: DC | PRN

## 2012-03-10 NOTE — Assessment & Plan Note (Signed)
Change to norco at her request Check her UDS today

## 2012-03-10 NOTE — Assessment & Plan Note (Signed)
Continue the current meds for pain

## 2012-03-10 NOTE — Assessment & Plan Note (Signed)
She now tells me that she gets better relief from her pain with norco than dilaudid so I made the change for her I will check her UDS to see that she is compliant and to screen her for substance abuse

## 2012-03-10 NOTE — Progress Notes (Signed)
Subjective:    Patient ID: Kelli Reyes, female    DOB: November 15, 1945, 66 y.o.   MRN: [id number]  Back Pain This is a chronic problem. The current episode started more than 1 year ago. The problem occurs intermittently. The problem is unchanged. The pain is present in the lumbar spine. The quality of the pain is described as aching. The pain radiates to the left thigh. The pain is at a severity of 2/10. The pain is mild. The pain is worse during the day. The symptoms are aggravated by bending and standing. Associated symptoms include pelvic pain (chronic,unchanged). Pertinent negatives include no abdominal pain, bladder incontinence, bowel incontinence, chest pain, dysuria, fever, headaches, leg pain, numbness, paresis, paresthesias, perianal numbness, tingling, weakness or weight loss. She has tried analgesics for the symptoms. The treatment provided significant relief.      Review of Systems  Constitutional: Negative for fever, chills, weight loss, diaphoresis, activity change, appetite change, fatigue and unexpected weight change.  HENT: Negative.   Eyes: Negative.   Respiratory: Negative for cough, chest tightness, shortness of breath, wheezing and stridor.   Cardiovascular: Negative for chest pain, palpitations and leg swelling.  Gastrointestinal: Negative for nausea, vomiting, abdominal pain, diarrhea, constipation and bowel incontinence.  Genitourinary: Positive for pelvic pain (chronic,unchanged). Negative for bladder incontinence and dysuria.  Musculoskeletal: Positive for back pain. Negative for myalgias, joint swelling, arthralgias and gait problem.  Skin: Negative for color change, pallor, rash and wound.  Neurological: Negative for dizziness, tingling, weakness, numbness, headaches and paresthesias.  Hematological: Negative for adenopathy. Does not bruise/bleed easily.  Psychiatric/Behavioral: Negative.        Objective:   Physical Exam  Vitals reviewed. Constitutional: She is  oriented to person, place, and time. She appears well-developed and well-nourished. No distress.  HENT:  Head: Normocephalic and atraumatic.  Mouth/Throat: Oropharynx is clear and moist. No oropharyngeal exudate.  Eyes: Conjunctivae normal are normal. Right eye exhibits no discharge. Left eye exhibits no discharge. No scleral icterus.  Neck: Normal range of motion. Neck supple. No JVD present. No tracheal deviation present. No thyromegaly present.  Cardiovascular: Normal rate, regular rhythm, normal heart sounds and intact distal pulses.  Exam reveals no gallop and no friction rub.   No murmur heard. Pulmonary/Chest: Effort normal and breath sounds normal. No stridor. No respiratory distress. She has no wheezes. She has no rales. She exhibits no tenderness.  Abdominal: Soft. Bowel sounds are normal. She exhibits no distension and no mass. There is no tenderness. There is no rebound and no guarding.  Musculoskeletal: Normal range of motion. She exhibits no edema and no tenderness.  Lymphadenopathy:    She has no cervical adenopathy.  Neurological: She is alert and oriented to person, place, and time. She has normal reflexes. She displays normal reflexes. No cranial nerve deficit. She exhibits normal muscle tone. Coordination normal.  Skin: Skin is warm and dry. No rash noted. She is not diaphoretic. No erythema. No pallor.  Psychiatric: She has a normal mood and affect. Her behavior is normal. Judgment and thought content normal.     Lab Results  Component Value Date   WBC 10.2 02/23/2012   HGB 15.8* 02/23/2012   HCT 46.4* 02/23/2012   PLT 229 02/23/2012   GLUCOSE 200* 02/23/2012   CHOL 195 09/03/2011   TRIG 116.0 09/03/2011   HDL 49.10 09/03/2011   LDLDIRECT 169.7 04/30/2011   LDLCALC 123* 09/03/2011   ALT 28 02/23/2012   AST 26 02/23/2012   NA 137 02/23/2012  K 3.8 02/23/2012   CL 101 02/23/2012   CREATININE 0.77 02/23/2012   BUN 14 02/23/2012   CO2 20 02/23/2012   TSH 0.96  04/30/2011   INR 0.97 06/05/2010   Dg Abd Acute W/chest  02/25/2012  *RADIOLOGY REPORT*  Clinical Data: Mid abdominal pain, ex-smoker, chronic bronchitis  ACUTE ABDOMEN SERIES (ABDOMEN 2 VIEW & CHEST 1 VIEW)  Comparison: Chest radiograph - 02/23/2011; chest CT - 06/28/2011; abdominal CT - 06/19/2010  Findings:  Grossly unchanged cardiac silhouette and mediastinal contours with atherosclerotic calcifications within the aortic arch. Scattered bilateral calcified granulomas and calcified bilateral hilar lymph nodes are grossly unchanged.  Chronic thickening along the right minor fissure is also unchanged.  No new focal airspace opacities. No pleural effusion or pneumothorax.  There is mild gaseous distension of a rather featureless loop of likely small bowel within the right lower abdominal quadrant. Multiple calcified diverticuli are seen within the descending colon.  No definite pneumatosis or portal venous gas.  No acute osseous abnormalities.  IMPRESSION: 1.  Sequela of prior granulomatous infection within the chest without definite acute cardiopulmonary disease.  2. Nonspecific mild gaseous distension of the rather featureless loop of likely small bowel within the right lower abdominal quadrant.  Further evaluation with abdominal CT may be performed as clinically indicated.  This was made a call report.   Original Report Authenticated By: Tacey Ruiz, MD      Assessment & Plan:

## 2012-03-10 NOTE — Assessment & Plan Note (Signed)
I will check her a1c today to see if she has developed DM II 

## 2012-03-10 NOTE — Patient Instructions (Signed)
Back Pain, Adult Low back pain is very common. About 1 in 5 people have back pain.The cause of low back pain is rarely dangerous. The pain often gets better over time.About half of people with a sudden onset of back pain feel better in just 2 weeks. About 8 in 10 people feel better by 6 weeks.  CAUSES Some common causes of back pain include:  Strain of the muscles or ligaments supporting the spine.  Wear and tear (degeneration) of the spinal discs.  Arthritis.  Direct injury to the back. DIAGNOSIS Most of the time, the direct cause of low back pain is not known.However, back pain can be treated effectively even when the exact cause of the pain is unknown.Answering your caregiver's questions about your overall health and symptoms is one of the most accurate ways to make sure the cause of your pain is not dangerous. If your caregiver needs more information, he or she may order lab work or imaging tests (X-rays or MRIs).However, even if imaging tests show changes in your back, this usually does not require surgery. HOME CARE INSTRUCTIONS For many people, back pain returns.Since low back pain is rarely dangerous, it is often a condition that people can learn to manageon their own.   Remain active. It is stressful on the back to sit or stand in one place. Do not sit, drive, or stand in one place for more than 30 minutes at a time. Take short walks on level surfaces as soon as pain allows.Try to increase the length of time you walk each day.  Do not stay in bed.Resting more than 1 or 2 days can delay your recovery.  Do not avoid exercise or work.Your body is made to move.It is not dangerous to be active, even though your back may hurt.Your back will likely heal faster if you return to being active before your pain is gone.  Pay attention to your body when you bend and lift. Many people have less discomfortwhen lifting if they bend their knees, keep the load close to their bodies,and  avoid twisting. Often, the most comfortable positions are those that put less stress on your recovering back.  Find a comfortable position to sleep. Use a firm mattress and lie on your side with your knees slightly bent. If you lie on your back, put a pillow under your knees.  Only take over-the-counter or prescription medicines as directed by your caregiver. Over-the-counter medicines to reduce pain and inflammation are often the most helpful.Your caregiver may prescribe muscle relaxant drugs.These medicines help dull your pain so you can more quickly return to your normal activities and healthy exercise.  Put ice on the injured area.  Put ice in a plastic bag.  Place a towel between your skin and the bag.  Leave the ice on for 15 to 20 minutes, 3 to 4 times a day for the first 2 to 3 days. After that, ice and heat may be alternated to reduce pain and spasms.  Ask your caregiver about trying back exercises and gentle massage. This may be of some benefit.  Avoid feeling anxious or stressed.Stress increases muscle tension and can worsen back pain.It is important to recognize when you are anxious or stressed and learn ways to manage it.Exercise is a great option. SEEK MEDICAL CARE IF:  You have pain that is not relieved with rest or medicine.  You have pain that does not improve in 1 week.  You have new symptoms.  You are generally   not feeling well. SEEK IMMEDIATE MEDICAL CARE IF:   You have pain that radiates from your back into your legs.  You develop new bowel or bladder control problems.  You have unusual weakness or numbness in your arms or legs.  You develop nausea or vomiting.  You develop abdominal pain.  You feel faint. Document Released: 03/12/2005 Document Revised: 09/11/2011 Document Reviewed: 07/31/2010 [employer] Patient Information 2013 [employer].  

## 2012-03-11 ENCOUNTER — Encounter: Payer: Self-pay | Admitting: Internal Medicine

## 2012-03-11 LAB — DRUGS OF ABUSE SCREEN W/O ALC, ROUTINE URINE
Amphetamine Screen, Ur: NEGATIVE
Cocaine Metabolites: NEGATIVE
Marijuana Metabolite: NEGATIVE
Opiate Screen, Urine: NEGATIVE
Phencyclidine (PCP): NEGATIVE
Propoxyphene: NEGATIVE

## 2012-03-12 NOTE — Addendum Note (Signed)
Addended by: Etta Grandchild on: 03/12/2012 08:36 AM   Modules accepted: Orders

## 2012-06-13 ENCOUNTER — Ambulatory Visit (INDEPENDENT_AMBULATORY_CARE_PROVIDER_SITE_OTHER): Payer: BC Managed Care – PPO | Admitting: Internal Medicine

## 2012-06-13 ENCOUNTER — Encounter: Payer: Self-pay | Admitting: Internal Medicine

## 2012-06-13 VITALS — BP 118/80 | HR 101 | Temp 98.8°F | Resp 16 | Wt 154.0 lb

## 2012-06-13 DIAGNOSIS — I10 Essential (primary) hypertension: Secondary | ICD-10-CM

## 2012-06-13 DIAGNOSIS — M545 Low back pain: Secondary | ICD-10-CM

## 2012-06-13 DIAGNOSIS — G894 Chronic pain syndrome: Secondary | ICD-10-CM

## 2012-06-13 DIAGNOSIS — R109 Unspecified abdominal pain: Secondary | ICD-10-CM

## 2012-06-13 DIAGNOSIS — G8929 Other chronic pain: Secondary | ICD-10-CM

## 2012-06-13 MED ORDER — HYDROCODONE-ACETAMINOPHEN 5-325 MG PO TABS: 1.0000 | ORAL_TABLET | Freq: Three times a day (TID) | ORAL | Status: DC | PRN

## 2012-06-13 NOTE — Assessment & Plan Note (Signed)
She is doing well on her current pain regimen She signed a CSC today

## 2012-06-13 NOTE — Patient Instructions (Signed)
Back Pain, Adult Low back pain is very common. About 1 in 5 people have back pain.The cause of low back pain is rarely dangerous. The pain often gets better over time.About half of people with a sudden onset of back pain feel better in just 2 weeks. About 8 in 10 people feel better by 6 weeks.  CAUSES Some common causes of back pain include:  Strain of the muscles or ligaments supporting the spine.  Wear and tear (degeneration) of the spinal discs.  Arthritis.  Direct injury to the back. DIAGNOSIS Most of the time, the direct cause of low back pain is not known.However, back pain can be treated effectively even when the exact cause of the pain is unknown.Answering your caregiver's questions about your overall health and symptoms is one of the most accurate ways to make sure the cause of your pain is not dangerous. If your caregiver needs more information, he or she may order lab work or imaging tests (X-rays or MRIs).However, even if imaging tests show changes in your back, this usually does not require surgery. HOME CARE INSTRUCTIONS For many people, back pain returns.Since low back pain is rarely dangerous, it is often a condition that people can learn to manageon their own.   Remain active. It is stressful on the back to sit or stand in one place. Do not sit, drive, or stand in one place for more than 30 minutes at a time. Take short walks on level surfaces as soon as pain allows.Try to increase the length of time you walk each day.  Do not stay in bed.Resting more than 1 or 2 days can delay your recovery.  Do not avoid exercise or work.Your body is made to move.It is not dangerous to be active, even though your back may hurt.Your back will likely heal faster if you return to being active before your pain is gone.  Pay attention to your body when you bend and lift. Many people have less discomfortwhen lifting if they bend their knees, keep the load close to their bodies,and  avoid twisting. Often, the most comfortable positions are those that put less stress on your recovering back.  Find a comfortable position to sleep. Use a firm mattress and lie on your side with your knees slightly bent. If you lie on your back, put a pillow under your knees.  Only take over-the-counter or prescription medicines as directed by your caregiver. Over-the-counter medicines to reduce pain and inflammation are often the most helpful.Your caregiver may prescribe muscle relaxant drugs.These medicines help dull your pain so you can more quickly return to your normal activities and healthy exercise.  Put ice on the injured area.  Put ice in a plastic bag.  Place a towel between your skin and the bag.  Leave the ice on for 15 to 20 minutes, 3 to 4 times a day for the first 2 to 3 days. After that, ice and heat may be alternated to reduce pain and spasms.  Ask your caregiver about trying back exercises and gentle massage. This may be of some benefit.  Avoid feeling anxious or stressed.Stress increases muscle tension and can worsen back pain.It is important to recognize when you are anxious or stressed and learn ways to manage it.Exercise is a great option. SEEK MEDICAL CARE IF:  You have pain that is not relieved with rest or medicine.  You have pain that does not improve in 1 week.  You have new symptoms.  You are generally   not feeling well. SEEK IMMEDIATE MEDICAL CARE IF:   You have pain that radiates from your back into your legs.  You develop new bowel or bladder control problems.  You have unusual weakness or numbness in your arms or legs.  You develop nausea or vomiting.  You develop abdominal pain.  You feel faint. Document Released: 03/12/2005 Document Revised: 09/11/2011 Document Reviewed: 07/31/2010 [employer] Patient Information 2013 [employer].  

## 2012-06-13 NOTE — Progress Notes (Signed)
Subjective:    Patient ID: Kelli Reyes, female    DOB: [birth year], 67 y.o.   MRN: [id number]  Back Pain This is a chronic problem. The current episode started more than 1 year ago. The problem occurs intermittently. The problem is unchanged. The pain is present in the lumbar spine. The quality of the pain is described as aching. The pain does not radiate. The pain is at a severity of 4/10. The pain is moderate. The pain is worse during the day. The symptoms are aggravated by bending, sitting and standing. Stiffness is present all day. Pertinent negatives include no abdominal pain, bladder incontinence, bowel incontinence, chest pain, dysuria, fever, headaches, leg pain, numbness, paresis, paresthesias, pelvic pain, perianal numbness, tingling, weakness or weight loss. She has tried analgesics for the symptoms. The treatment provided significant relief.      Review of Systems  Constitutional: Negative.  Negative for fever, chills, weight loss, diaphoresis, activity change, appetite change, fatigue and unexpected weight change.  HENT: Negative.   Eyes: Negative.   Respiratory: Negative.  Negative for cough, chest tightness, shortness of breath, wheezing and stridor.   Cardiovascular: Negative.  Negative for chest pain, palpitations and leg swelling.  Gastrointestinal: Negative.  Negative for nausea, vomiting, abdominal pain, diarrhea, constipation, blood in stool and bowel incontinence.  Endocrine: Negative.   Genitourinary: Negative for bladder incontinence, dysuria and pelvic pain.  Musculoskeletal: Positive for back pain. Negative for myalgias, joint swelling, arthralgias and gait problem.  Skin: Negative.  Negative for color change, pallor, rash and wound.  Allergic/Immunologic: Negative.   Neurological: Negative.  Negative for tingling, weakness, numbness, headaches and paresthesias.  Hematological: Negative.  Negative for adenopathy. Does not bruise/bleed easily.   Psychiatric/Behavioral: Negative.        Objective:   Physical Exam  Vitals reviewed. Constitutional: She is oriented to person, place, and time. She appears well-developed and well-nourished. No distress.  HENT:  Head: Normocephalic and atraumatic.  Mouth/Throat: Oropharynx is clear and moist. No oropharyngeal exudate.  Eyes: Conjunctivae are normal. Right eye exhibits no discharge. Left eye exhibits no discharge. No scleral icterus.  Neck: Normal range of motion. Neck supple. No JVD present. No tracheal deviation present. No thyromegaly present.  Cardiovascular: Normal rate, regular rhythm, normal heart sounds and intact distal pulses.  Exam reveals no gallop and no friction rub.   No murmur heard. Pulmonary/Chest: Effort normal and breath sounds normal. No stridor. No respiratory distress. She has no wheezes. She has no rales. She exhibits no tenderness.  Abdominal: Soft. Bowel sounds are normal. She exhibits no distension and no mass. There is no tenderness. There is no rebound and no guarding.  Musculoskeletal: Normal range of motion. She exhibits no edema and no tenderness.       Lumbar back: Normal. She exhibits normal range of motion, no tenderness, no bony tenderness, no swelling, no edema, no deformity, no pain and no spasm.  Lymphadenopathy:    She has no cervical adenopathy.  Neurological: She is alert and oriented to person, place, and time. She has normal strength. She displays no atrophy, no tremor and normal reflexes. No cranial nerve deficit or sensory deficit. She exhibits normal muscle tone. She displays a negative Romberg sign. She displays no seizure activity. Coordination and gait normal.  Reflex Scores:      Tricep reflexes are 1+ on the right side and 1+ on the left side.      Bicep reflexes are 1+ on the right side and 1+ on the  left side.      Brachioradialis reflexes are 1+ on the right side and 1+ on the left side.      Patellar reflexes are 1+ on the right  side and 1+ on the left side.      Achilles reflexes are 1+ on the right side and 1+ on the left side. - SLR in BLE  Skin: Skin is warm and dry. No rash noted. She is not diaphoretic. No erythema. No pallor.  Psychiatric: She has a normal mood and affect. Her behavior is normal. Judgment and thought content normal.      Lab Results  Component Value Date   WBC 10.2 02/23/2012   HGB 15.8* 02/23/2012   HCT 46.4* 02/23/2012   PLT 229 02/23/2012   GLUCOSE 105* 03/10/2012   CHOL 195 09/03/2011   TRIG 116.0 09/03/2011   HDL 49.10 09/03/2011   LDLDIRECT 169.7 04/30/2011   LDLCALC 123* 09/03/2011   ALT 28 02/23/2012   AST 26 02/23/2012   NA 138 03/10/2012   K 3.6 03/10/2012   CL 101 03/10/2012   CREATININE 1.1 03/10/2012   BUN 22 03/10/2012   CO2 24 03/10/2012   TSH 0.96 04/30/2011   INR 0.97 06/05/2010   HGBA1C 5.9 03/10/2012      Assessment & Plan:

## 2012-06-13 NOTE — Assessment & Plan Note (Signed)
Her BP is well controlled 

## 2012-07-14 ENCOUNTER — Ambulatory Visit (INDEPENDENT_AMBULATORY_CARE_PROVIDER_SITE_OTHER): Payer: BC Managed Care – PPO | Admitting: Internal Medicine

## 2012-07-14 ENCOUNTER — Other Ambulatory Visit (INDEPENDENT_AMBULATORY_CARE_PROVIDER_SITE_OTHER): Payer: BC Managed Care – PPO

## 2012-07-14 ENCOUNTER — Ambulatory Visit: Payer: BC Managed Care – PPO

## 2012-07-14 ENCOUNTER — Encounter: Payer: Self-pay | Admitting: *Deleted

## 2012-07-14 ENCOUNTER — Encounter: Payer: Self-pay | Admitting: Internal Medicine

## 2012-07-14 VITALS — BP 118/64 | HR 123 | Temp 102.1°F

## 2012-07-14 DIAGNOSIS — R109 Unspecified abdominal pain: Secondary | ICD-10-CM

## 2012-07-14 DIAGNOSIS — H538 Other visual disturbances: Secondary | ICD-10-CM

## 2012-07-14 DIAGNOSIS — R197 Diarrhea, unspecified: Secondary | ICD-10-CM

## 2012-07-14 DIAGNOSIS — N10 Acute tubulo-interstitial nephritis: Secondary | ICD-10-CM

## 2012-07-14 DIAGNOSIS — R110 Nausea: Secondary | ICD-10-CM

## 2012-07-14 DIAGNOSIS — R509 Fever, unspecified: Secondary | ICD-10-CM

## 2012-07-14 LAB — URINALYSIS, ROUTINE W REFLEX MICROSCOPIC
Leukocytes, UA: NEGATIVE
Nitrite: POSITIVE
Specific Gravity, Urine: 1.025 (ref 1.000–1.030)
Total Protein, Urine: 30
pH: 6 (ref 5.0–8.0)

## 2012-07-14 LAB — BASIC METABOLIC PANEL
BUN: 27 mg/dL — ABNORMAL HIGH (ref 6–23)
Calcium: 8.7 mg/dL (ref 8.4–10.5)
Creatinine, Ser: 1.3 mg/dL — ABNORMAL HIGH (ref 0.4–1.2)

## 2012-07-14 LAB — POCT URINALYSIS DIPSTICK
Leukocytes, UA: NEGATIVE
Spec Grav, UA: 1.025
Urobilinogen, UA: >=8
pH, UA: 6

## 2012-07-14 LAB — HEPATIC FUNCTION PANEL
ALT: 1640 U/L — ABNORMAL HIGH (ref 0–35)
AST: 1432 U/L — ABNORMAL HIGH (ref 0–37)
Albumin: 3.7 g/dL (ref 3.5–5.2)
Total Bilirubin: 4.4 mg/dL — ABNORMAL HIGH (ref 0.3–1.2)
Total Protein: 8.1 g/dL (ref 6.0–8.3)

## 2012-07-14 LAB — CBC
HCT: 39.1 % (ref 36.0–46.0)
Hemoglobin: 13.2 g/dL (ref 12.0–15.0)
MCHC: 33.8 g/dL (ref 30.0–36.0)
MCV: 90.5 fl (ref 78.0–100.0)
Platelets: 186 10*3/uL (ref 150.0–400.0)
RDW: 13.6 % (ref 11.5–14.6)

## 2012-07-14 MED ORDER — CIPROFLOXACIN HCL 500 MG PO TABS: 500.0000 mg | ORAL_TABLET | Freq: Two times a day (BID) | ORAL | Status: DC

## 2012-07-14 NOTE — Progress Notes (Signed)
Subjective:    Patient ID: Kelli Reyes, female    DOB: 08-24-1945, 67 y.o.   MRN: [id number]  HPI  Pt presents to the clinic today with c/o back pain. This started 1 day ago. The pain is on bilateral flanks. She reports the pain is 8/10. She does have associated fever of 102.1. She is also having diarrhea and nausea. She denies urinary symptoms. She does have a history of osteoarthritis and chronic pain syndrome. She is on Vicodin for this. She reports the back pan she is experiencing today is different from her usual back pain. Additionally today, she c/o blurred vision. This started this morning. The blurred vision comes and goes.She doe not feel any increased pressure in her eyes. She is not seeing flashes of light or halos. She denies having a headache. She has never had blurred vision like this in the past.   Review of Systems      Past Medical History  Diagnosis Date  . COPD (chronic obstructive pulmonary disease)   . GERD (gastroesophageal reflux disease)   . Hypertension   . Osteoarthritis     Current Outpatient Prescriptions  Medication Sig Dispense Refill  . famotidine (PEPCID) 20 MG tablet One at bedtime  30 tablet  11  . fexofenadine (ALLEGRA) 180 MG tablet Take 1 tablet (180 mg total) by mouth daily.  90 tablet  3  . HYDROcodone-acetaminophen (NORCO/VICODIN) 5-325 MG per tablet Take 1 tablet by mouth every 8 (eight) hours as needed.  65 tablet  4  . metoCLOPramide (REGLAN) 10 MG tablet Take 1 tablet (10 mg total) by mouth every 6 (six) hours.  12 tablet  0  . mometasone (NASONEX) 50 MCG/ACT nasal spray Place 2 sprays into the nose daily.  17 g  12  . olmesartan (BENICAR) 40 MG tablet Take 40 mg by mouth daily.      . ondansetron (ZOFRAN) 4 MG tablet Take 2 tablets (8 mg total) by mouth every 6 (six) hours.  12 tablet  0  . Pitavastatin Calcium (LIVALO) 4 MG TABS Take 1 tablet (4 mg total) by mouth daily.  90 tablet  3  . tiotropium (SPIRIVA HANDIHALER) 18 MCG inhalation  capsule Place 18 mcg into inhaler and inhale daily.        No current facility-administered medications for this visit.    Allergies  Allergen Reactions  . Ibuprofen     REACTION: rash    Family History  Problem Relation Age of Onset  . Hyperlipidemia Other   . Cancer Neg Hx   . Stroke Neg Hx   . Heart disease Neg Hx     History   Social History  . Marital Status: Widowed    Spouse Name: N/A    Number of Children: 4  . Years of Education: N/A   Occupational History  . school bus driver     [place]   Social History Main Topics  . Smoking status: Former Smoker -- 2.00 packs/day for 25 years    Types: Cigarettes    Quit date: 09/03/1996  . Smokeless tobacco: Never Used  . Alcohol Use: 3.0 oz/week    5 Glasses of wine per week     Comment: beer 2-3 times a week  . Drug Use: No  . Sexually Active: Not Currently   Other Topics Concern  . Not on file   Social History Narrative   No regular exercise           Constitutional:  Pt reports fever, malaise. Denies fatigue, headache or abrupt weight changes.  HEENT: Pt reports blurred vision. Denies eye pain, eye redness, ear pain, ringing in the ears, wax buildup, runny nose, nasal congestion, bloody nose, or sore throat. Gastrointestinal: Pt reports nausea and diarrhea. Denies abdominal pain, bloating, constipation, or blood in the stool.  GU: Denies urgency, frequency, pain with urination, burning sensation, blood in urine, odor or discharge. Musculoskeletal: Pt reports bilateral flank pain. Denies decrease in range of motion, difficulty with gait, or joint pain and swelling.  Neurological: Denies dizziness, difficulty with memory, difficulty with speech or problems with balance and coordination.   No other specific complaints in a complete review of systems (except as listed in HPI above).  Objective:   Physical Exam   BP 118/64  Pulse 123  Temp(Src) 102.1 F (38.9 C) (Oral)  SpO2 95% Wt Readings  from Last 3 Encounters:  06/13/12 154 lb (69.854 kg)  03/10/12 152 lb (68.947 kg)  02/25/12 151 lb 4 oz (68.607 kg)    General: Appears her stated age, well developed, well nourished in NAD. HEENT: Head: normal shape and size; Eyes: sclera white, no icterus, conjunctiva pink, PERRLA and EOMs intact; Ears: Tm's gray and intact, normal light reflex; Nose: mucosa pink and moist, septum midline; Throat/Mouth: Teeth present, mucosa pink and moist, no exudate, lesions or ulcerations noted. .  Cardiovascular: Tachycardic. S1,S2 noted.  No murmur, rubs or gallops noted. No JVD or BLE edema. No carotid bruits noted. Pulmonary/Chest: Normal effort and positive vesicular breath sounds. No respiratory distress. No wheezes, rales or ronchi noted.  Abdomen: Soft and nontender. Normal bowel sounds, no bruits noted. No distention or masses noted. Liver, spleen and kidneys non palpable. + CVA tenderness bilaterally. Musculoskeletal: Normal range of motion. No signs of joint swelling. No difficulty with gait.  Neurological: Alert and oriented. Cranial nerves II-XII intact. Coordination normal. +DTRs bilaterally.       Assessment & Plan:   Bilateral flank pain with associated fever, nausea and diarrhea, concerning for UTI verus peylonephritis:  Will obtain urinalysis and urine culture + pyelonephritis Tylenol as directed for fever eRx for Cipro BID x  7 days Pt declines RX for nausea at this time Drink plenty of fluids  RTC in 1 week to recheck the urine. If symptoms worse or you have trouble holding down fluids, go to the ER immediately

## 2012-07-14 NOTE — Patient Instructions (Signed)
Pyelonephritis, Adult  Pyelonephritis is a kidney infection. In general, there are 2 main types of pyelonephritis:   Infections that come on quickly without any warning (acute pyelonephritis).   Infections that persist for a long period of time (chronic pyelonephritis).  CAUSES   Two main causes of pyelonephritis are:   Bacteria traveling from the bladder to the kidney. This is a problem especially in pregnant women. The urine in the bladder can become filled with bacteria from multiple causes, including:   Inflammation of the prostate gland (prostatitis).   Sexual intercourse in females.   Bladder infection (cystitis).   Bacteria traveling from the bloodstream to the tissue part of the kidney.  Problems that may increase your risk of getting a kidney infection include:   Diabetes.   Kidney stones or bladder stones.   Cancer.   Catheters placed in the bladder.   Other abnormalities of the kidney or ureter.  SYMPTOMS    Abdominal pain.   Pain in the side or flank area.   Fever.   Chills.   Upset stomach.   Blood in the urine (dark urine).   Frequent urination.   Strong or persistent urge to urinate.   Burning or stinging when urinating.  DIAGNOSIS   Your caregiver may diagnose your kidney infection based on your symptoms. A urine sample may also be taken.  TREATMENT   In general, treatment depends on how severe the infection is.    If the infection is mild and caught early, your caregiver may treat you with oral antibiotics and send you home.   If the infection is more severe, the bacteria may have gotten into the bloodstream. This will require intravenous (IV) antibiotics and a hospital stay. Symptoms may include:   High fever.   Severe flank pain.   Shaking chills.   Even after a hospital stay, your caregiver may require you to be on oral antibiotics for a period of time.   Other treatments may be required depending upon the cause of the infection.  HOME CARE INSTRUCTIONS    Take your  antibiotics as directed. Finish them even if you start to feel better.   Make an appointment to have your urine checked to make sure the infection is gone.   Drink enough fluids to keep your urine clear or pale yellow.   Take medicines for the bladder if you have urgency and frequency of urination as directed by your caregiver.  SEEK IMMEDIATE MEDICAL CARE IF:    You have a fever or persistent symptoms for more than 2-3 days.   You have a fever and your symptoms suddenly get worse.   You are unable to take your antibiotics or fluids.   You develop shaking chills.   You experience extreme weakness or fainting.   There is no improvement after 2 days of treatment.  MAKE SURE YOU:   Understand these instructions.   Will watch your condition.   Will get help right away if you are not doing well or get worse.  Document Released: 03/12/2005 Document Revised: 09/11/2011 Document Reviewed: 08/16/2010  [employer] Patient Information 2013 [employer].

## 2012-07-15 ENCOUNTER — Other Ambulatory Visit (INDEPENDENT_AMBULATORY_CARE_PROVIDER_SITE_OTHER): Payer: BC Managed Care – PPO

## 2012-07-15 ENCOUNTER — Ambulatory Visit (INDEPENDENT_AMBULATORY_CARE_PROVIDER_SITE_OTHER): Payer: BC Managed Care – PPO | Admitting: Internal Medicine

## 2012-07-15 ENCOUNTER — Ambulatory Visit (INDEPENDENT_AMBULATORY_CARE_PROVIDER_SITE_OTHER)
Admission: RE | Admit: 2012-07-15 | Discharge: 2012-07-15 | Disposition: A | Discharge status: Home / Self Care | Payer: BC Managed Care – PPO | Source: Ambulatory Visit | Attending: Internal Medicine | Admitting: Internal Medicine

## 2012-07-15 ENCOUNTER — Encounter: Payer: Self-pay | Admitting: Internal Medicine

## 2012-07-15 VITALS — BP 90/60 | HR 119 | Temp 99.1°F | Resp 16 | Wt 151.0 lb

## 2012-07-15 DIAGNOSIS — R1011 Right upper quadrant pain: Secondary | ICD-10-CM

## 2012-07-15 DIAGNOSIS — B169 Acute hepatitis B without delta-agent and without hepatic coma: Secondary | ICD-10-CM | POA: Insufficient documentation

## 2012-07-15 DIAGNOSIS — K759 Inflammatory liver disease, unspecified: Secondary | ICD-10-CM

## 2012-07-15 DIAGNOSIS — I10 Essential (primary) hypertension: Secondary | ICD-10-CM

## 2012-07-15 LAB — COMPREHENSIVE METABOLIC PANEL
AST: 1771 U/L — ABNORMAL HIGH (ref 0–37)
Albumin: 3.5 g/dL (ref 3.5–5.2)
BUN: 30 mg/dL — ABNORMAL HIGH (ref 6–23)
Calcium: 8.8 mg/dL (ref 8.4–10.5)
Chloride: 98 mEq/L (ref 96–112)
Glucose, Bld: 101 mg/dL — ABNORMAL HIGH (ref 70–99)
Potassium: 3.6 mEq/L (ref 3.5–5.1)
Sodium: 133 mEq/L — ABNORMAL LOW (ref 135–145)
Total Protein: 7.8 g/dL (ref 6.0–8.3)

## 2012-07-15 LAB — PROTIME-INR: Prothrombin Time: 14.8 s — ABNORMAL HIGH (ref 10.2–12.4)

## 2012-07-15 LAB — AMYLASE: Amylase: 78 U/L (ref 27–131)

## 2012-07-15 LAB — URINE CULTURE: Colony Count: 50000

## 2012-07-15 LAB — SEDIMENTATION RATE: Sed Rate: 61 mm/hr — ABNORMAL HIGH (ref 0–22)

## 2012-07-15 LAB — HEPATITIS C ANTIBODY: HCV Ab: NEGATIVE

## 2012-07-15 LAB — LIPID PANEL: Triglycerides: 303 mg/dL — ABNORMAL HIGH (ref 0.0–149.0)

## 2012-07-15 NOTE — Patient Instructions (Signed)
Hepatitis Virus Studies Hepatitis is an inflammation of the liver which can be caused by viruses, alcohol intake, various drugs, toxins, or bad infections caused by bacteria. The three most common viruses now recognized to cause disease are hepatitis A, hepatitis B, and hepatitis C (also called non A / non B). Hepatitis D and E are other forms of viruses. Most often the hepatitis viruses are associated with the enzymes found in the liver. These enzymes include the AST, ALT, and LDH. These are tests used to measure the likelihood of you having one of these viral infections.  The tests that are often done for liver function are: Serologic findings: HAV-AB/IgM  Hepatitis Testing Appearance/disappearance: 4-6 weeks/3-4 months  Application: Acute HAV infection Serologic findings: HAV-Ab/IgG  Hepatitis Testing Appearance/disappearance: 8-12 weeks/10years  Application: Previous HAV exposure Serologic findings: HBeAg  Hepatitis Testing Appearance/disappearance: 1-3 weeks/6-8 weeks  Application: Acute HBV infection Serologic findings: HBeAb  Hepatitis Testing Appearance/disappearance: 4-6 weeks/4-6 years  Application: Acute HBV infection ended Serologic findings: HBsAg  Hepatitis Testing Appearance/disappearance: 4-12 weeks/1-3 months  Application: Acute HBV infection Serologic findings: HBsAb total  Hepatitis Testing Appearance/disappearance: 3-10 months/6-10 years  Application: Previous HBV infection Serologic findings: HBVc-Ab/IgM  Hepatitis Testing Appearance/disappearance: 2-12 weeks/3-6 months  Application: Acute HBV infection Serologic findings: HBVc-Ab total  Hepatitis Testing Appearance/disappearance: 3-12 weeks/life  Application: Previous HBV infection Serologic findings: HCV-Ab/IgG  Hepatitis Testing Appearance/disappearance: 3-4 months/2years  Application: Previous HCV infection Serologic findings: HDV Ag  Hepatitis Testing Appearance/disappearance: 1-3 days/3-5  days  Application: Acute HDV infection Serologic findings: HDV-Ab total  Hepatitis Testing Appearance/disappearance: 2-3 months/7-14 months  Application: Chronic HDV infection PREPARATION FOR TEST No preparation or fasting is necessary. A blood sample is obtained by inserting a needle into a vein in the arm. NORMAL FINDINGS Negative Ranges for normal findings may vary among different laboratories and hospitals. You should always check with your doctor after having lab work or other tests done to discuss the meaning of your test results and whether your values are considered within normal limits. MEANING OF TEST  Your caregiver will go over the test results with you and discuss the importance and meaning of your results, as well as treatment options and the need for additional tests if necessary. OBTAINING THE TEST RESULTS It is your responsibility to obtain your test results. Ask the lab or department performing the test when and how you will get your results. Document Released: 04/14/2004 Document Revised: 06/04/2011 Document Reviewed: 02/21/2008 [employer] Patient Information 2013 [employer].  

## 2012-07-15 NOTE — Progress Notes (Signed)
Subjective:    Patient ID: Kelli Reyes, female    DOB: [birth year], 67 y.o.   MRN: [id number]  Abdominal Pain This is a new problem. The current episode started in the past 7 days. The onset quality is gradual. The problem occurs intermittently. The problem has been unchanged. The pain is located in the RUQ. The pain is at a severity of 2/10. The pain is mild. The abdominal pain does not radiate. Associated symptoms include anorexia, a fever, nausea and weight loss. Pertinent negatives include no arthralgias, belching, constipation, diarrhea, dysuria, flatus, frequency, headaches, hematochezia, hematuria, melena, myalgias or vomiting. Nothing aggravates the pain. The pain is relieved by nothing. She has tried acetaminophen for the symptoms. The treatment provided no relief.      Review of Systems  Constitutional: Positive for fever, chills, weight loss, appetite change and fatigue. Negative for diaphoresis, activity change and unexpected weight change.  HENT: Negative.   Eyes: Negative.   Respiratory: Negative.  Negative for apnea, cough, choking, chest tightness, shortness of breath, wheezing and stridor.   Cardiovascular: Negative.  Negative for chest pain, palpitations and leg swelling.  Gastrointestinal: Positive for nausea, abdominal pain and anorexia. Negative for vomiting, diarrhea, constipation, melena, hematochezia, abdominal distention, anal bleeding, rectal pain and flatus.  Endocrine: Negative.   Genitourinary: Negative.  Negative for dysuria, frequency and hematuria.  Musculoskeletal: Positive for back pain (chronic,unchanged). Negative for myalgias, joint swelling and arthralgias.  Skin: Negative.  Negative for color change, pallor, rash and wound.  Allergic/Immunologic: Negative.   Neurological: Negative.  Negative for dizziness, tremors, weakness, light-headedness and headaches.  Hematological: Negative.  Negative for adenopathy. Does not bruise/bleed easily.   Psychiatric/Behavioral: Negative.        Objective:   Physical Exam  Constitutional: She is oriented to person, place, and time. She appears well-developed and well-nourished. She appears toxic. She has a sickly appearance. She appears ill. No distress.  HENT:  Head: Normocephalic and atraumatic.  Mouth/Throat: Oropharynx is clear and moist. No oropharyngeal exudate.  Eyes: Conjunctivae are normal. Right eye exhibits no discharge. Left eye exhibits no discharge. Scleral icterus is present.  Neck: Normal range of motion. Neck supple. No JVD present. No tracheal deviation present. No thyromegaly present.  Cardiovascular: Normal rate, regular rhythm, normal heart sounds and intact distal pulses.  Exam reveals no gallop and no friction rub.   No murmur heard. Pulmonary/Chest: Effort normal and breath sounds normal. No stridor. No respiratory distress. She has no wheezes. She has no rales. She exhibits no tenderness.  Abdominal: Soft. Normal appearance and bowel sounds are normal. She exhibits no shifting dullness, no distension, no pulsatile liver, no fluid wave, no abdominal bruit, no ascites, no pulsatile midline mass and no mass. There is no hepatosplenomegaly, splenomegaly or hepatomegaly. There is tenderness in the right upper quadrant. There is no rebound and no CVA tenderness. No hernia. Hernia confirmed negative in the ventral area, confirmed negative in the right inguinal area and confirmed negative in the left inguinal area.  Musculoskeletal: Normal range of motion. She exhibits no edema and no tenderness.  Lymphadenopathy:    She has no cervical adenopathy.  Neurological: She is oriented to person, place, and time.  Skin: Skin is warm and dry. No rash noted. She is not diaphoretic. No erythema. No pallor.  Psychiatric: She has a normal mood and affect. Her behavior is normal. Judgment and thought content normal.      Lab Results  Component Value Date   WBC 7.0 07/14/2012  HGB  13.2 07/14/2012   HCT 39.1 07/14/2012   PLT 186.0 07/14/2012   GLUCOSE 114* 07/14/2012   CHOL 195 09/03/2011   TRIG 116.0 09/03/2011   HDL 49.10 09/03/2011   LDLDIRECT 169.7 04/30/2011   LDLCALC 123* 09/03/2011   ALT 1640* 07/14/2012   AST 1432* 07/14/2012   NA 133* 07/14/2012   K 3.8 07/14/2012   CL 100 07/14/2012   CREATININE 1.3* 07/14/2012   BUN 27* 07/14/2012   CO2 21 07/14/2012   TSH 0.96 04/30/2011   INR 0.97 06/05/2010   HGBA1C 5.9 03/10/2012      Assessment & Plan:

## 2012-07-16 ENCOUNTER — Encounter: Payer: Self-pay | Admitting: Internal Medicine

## 2012-07-16 LAB — HEPATITIS PANEL, ACUTE
HCV Ab: NEGATIVE
Hep A IgM: NEGATIVE
Hep B C IgM: POSITIVE — AB
Hepatitis B Surface Ag: POSITIVE — AB

## 2012-07-16 NOTE — Assessment & Plan Note (Addendum)
She has several risk factors for hepatitis but she does not admit to any risk factors for viral hepatitis - I will recheck her LFT's and will check a PT to see if she is in hepatic failure, will also check her for viral hepatitis/EtOH abuse/hypertrigs/pancreatitis/PBC(AMA abs). I have ordered an u/s to see if there is evidence of an obstructive process. She will stop livalo and other suspicious meds.

## 2012-07-16 NOTE — Assessment & Plan Note (Signed)
Her BP is low so I have asked her to stop benicar

## 2012-07-16 NOTE — Assessment & Plan Note (Signed)
I do not think she has UTI or pyelonephritis so I have asked her to stop cipro I will check plain films today to see if she has SBO, ileus, free air Will check labs for pancreatitis I think her pain is caused by the hepatitis

## 2012-07-17 ENCOUNTER — Telehealth: Payer: Self-pay

## 2012-07-17 NOTE — Telephone Encounter (Signed)
Phone call from pt stating she received results that she has hepatitis B. She wants to know what she needs to be taking/doing? Please advise.

## 2012-07-17 NOTE — Telephone Encounter (Signed)
This has already been addressed

## 2012-07-18 DIAGNOSIS — Z0279 Encounter for issue of other medical certificate: Secondary | ICD-10-CM

## 2012-07-21 ENCOUNTER — Ambulatory Visit
Admission: RE | Admit: 2012-07-21 | Discharge: 2012-07-21 | Disposition: A | Discharge status: Home / Self Care | Payer: BC Managed Care – PPO | Source: Ambulatory Visit | Attending: Internal Medicine | Admitting: Internal Medicine

## 2012-07-21 DIAGNOSIS — R1011 Right upper quadrant pain: Secondary | ICD-10-CM

## 2012-07-21 DIAGNOSIS — K759 Inflammatory liver disease, unspecified: Secondary | ICD-10-CM

## 2012-07-28 ENCOUNTER — Ambulatory Visit: Payer: BC Managed Care – PPO | Admitting: Internal Medicine

## 2012-07-28 ENCOUNTER — Telehealth: Payer: Self-pay | Admitting: Oncology

## 2012-07-28 ENCOUNTER — Ambulatory Visit (INDEPENDENT_AMBULATORY_CARE_PROVIDER_SITE_OTHER): Payer: BC Managed Care – PPO | Admitting: Internal Medicine

## 2012-07-28 ENCOUNTER — Encounter: Payer: Self-pay | Admitting: Internal Medicine

## 2012-07-28 VITALS — BP 112/72 | HR 95 | Temp 97.5°F | Resp 16 | Wt 148.5 lb

## 2012-07-28 DIAGNOSIS — B1910 Unspecified viral hepatitis B without hepatic coma: Secondary | ICD-10-CM

## 2012-07-28 DIAGNOSIS — L299 Pruritus, unspecified: Secondary | ICD-10-CM | POA: Insufficient documentation

## 2012-07-28 DIAGNOSIS — B169 Acute hepatitis B without delta-agent and without hepatic coma: Secondary | ICD-10-CM

## 2012-07-28 MED ORDER — HYDROXYZINE HCL 10 MG PO TABS: 10.0000 mg | ORAL_TABLET | ORAL | Status: DC | PRN

## 2012-07-28 NOTE — Patient Instructions (Signed)
Hepatitis B Hepatitis B is a viral infection of the liver. Over half the people who become infected with hepatitis B never feel sick. However, some may later develop long-term liver disease (chronic hepatitis). There are 2 phases of the disease: sudden (acute) and longstanding (chronic). CAUSES Hepatitis B is caused by the hepatitis B virus (HBV). It can enter the body by sharing needles contaminated with blood from an infected person, by sharing intimate items such as toothbrushes and razors, or by sex with an infected person. A baby can get HBV from its birth mother. A caregiver may also get it from exposure to the blood of an infected patient by way of a cut or needle stick.  SYMPTOMS Acute Phase Many cases of acute HBV infection are mild and cause few problems.Some people may not even realize they are sick.Symptoms in others may last a few weeks to several months and include:  Loss of appetite.  Feeling very tired.  Nausea.  Vomiting.  Abdominal pain.  Dark yellow urine.  Yellow skin and eyes (jaundice). Chronic Phase  About 5% of people who get HBV infection become "chronic carriers." They often have no symptoms, but the virus stays in their body. They may spread the virus to others and can get long-term liver disease. The younger a child is when the infection starts, the more likely that child will be a carrier.  About 25% of chronic HBV carriers get a disease called "chronic active hepatitis." These people may develop scarring of the liver (cirrhosis), liver failure, or liver cancer. DIAGNOSIS Your caregiver can do a blood test to see if you have the disease. TREATMENT Acute hepatitis B does not usually require any drug treatment. It is important to avoid medicines such as acetaminophen that may cause increasing liver damage.  Treatment with many antiviral drugs is available and recommended for some patients with hepatitis B infection. The goal is to reduce the risk of  progressive chronic liver disease, transmission of infection to others, and other long-term complications such as cirrhosis, liver failure, and liver cancer. Drug treatment is often advised for people with:  Acute liver failure.  Clinical complications of cirrhosis.  Cirrhosis or advanced fibrosis with high serum measurements of viral DNA.  Reactivation of chronic HBV after chemotherapy or immunosuppression. Immediate drug treatment is not often advised for patients who have chronic infection but normal liver enzyme tests or patients who have a positive hepatitis B DNA test in blood but no other signs of active infection.Patients may have other circumstances that suggest a need or potential benefit from drug treatment. Successful treatment currently requires taking treatment drugs over a long period of time. An injected drug (interferon) may be given daily, 3 times a week, or once weekly for up to 1 year. An oral drug treatment plan may require daily dosing for many years or indefinitely, in order to prevent infection reactivation and worsening of liver disease. Side effects from these drugs are common and some may be very serious. Your response to treatment must be carefully monitored by both you and your caregiver throughout the entire treatment period. PREVENTION Hepatitis B vaccine is highly effective in preventing a hepatitis B infection.The vaccine is recommended worldwide for all newborns of hepatitis B infected mothers, and in many countries for all newborns. Hepatitis B vaccine is also recommended in the [place] for other people at higher than normal risk of getting an infection, including:  Sexually active people with multiple sex partners.  Homosexual and bisexual men.    People who live with someone who has hepatitis B.  Injection drug users.  Healthcare workers.  Patients on chronic hemodialysis and patients who need repeated blood or blood product transfusions.  Patients with  chronic liver disease due to any cause.  Unvaccinated people traveling to areas with high levels of local HBV infection.  Patients with diabetes. Hepatitis B immune globulin (HBIG) is often given with hepatitis B vaccine to people who have been exposed to blood contaminated with HBV. The HBIG protects you from the virus for the first 1 to 3 months. After that, the hepatitis B vaccine takes over and gives you long-term protection. Your caregiver will help you decide whether and when to get these shots following exposure to HBV. Healthcare workers need to avoid injuries and wear appropriate protective equipment such as gloves, gowns, and face masks when performing invasive medical or nursing procedures.  HOME CARE INSTRUCTIONS   Rest when you feel tired, and eat when you are hungry.  Avoid a sexual relationship until advised otherwise by your caregiver.  Avoid activities that could expose other people to your blood. Examples include sharing a toothbrush, nail clippers, razors, and needles.  This infection is contagious. Follow your caregiver's instructions in order to avoid spread of the infection.  Do not take any medicines until your caregiver says it is okay. This includes over-the-counter drugs such as acetominophen that are usually taken for fever or pain. SEEK IMMEDIATE MEDICAL CARE IF:   You are unable to eat or drink.  You feel sick to your stomach (nauseous) or throw up (vomit).  You feel confused.  Jaundice becomes more severe.  You have trouble breathing, a rash, or swelling of the skin, throat, mouth, or face. You may be having an allergic reaction to the medicine in the shot.  You start twitching or shaking (seizure).  You become very sleepy or have trouble waking up. MAKE SURE YOU:   Understand these instructions.  Will watch your condition.  Will get help right away if you are not doing well or get worse. Document Released: 03/09/2000 Document Revised: 06/04/2011  Document Reviewed: 07/11/2010 Exit[employer] Patient Information 2013 [employer].  

## 2012-07-28 NOTE — Assessment & Plan Note (Signed)
This is related to the Hep B infection I will check her Alk Phos level today  She will try hydroxyzine for the itching

## 2012-07-28 NOTE — Progress Notes (Signed)
  Subjective:    Patient ID: Kelli Reyes, female    DOB: [birth year], 67 y.o.   MRN: [id number]  HPI  She returns for f/up regarding Hepatitis B. Overall she feels better but she has developed some itching.  Review of Systems  Constitutional: Positive for fatigue and unexpected weight change. Negative for fever, chills, diaphoresis, activity change and appetite change.  HENT: Negative.   Eyes: Negative.   Respiratory: Negative.  Negative for cough, chest tightness, shortness of breath, wheezing and stridor.   Cardiovascular: Negative.   Gastrointestinal: Negative for nausea, vomiting, abdominal pain, diarrhea, constipation, blood in stool, abdominal distention, anal bleeding and rectal pain.  Endocrine: Negative.   Genitourinary: Negative.   Musculoskeletal: Negative.   Skin: Negative.   Allergic/Immunologic: Negative.   Neurological: Negative.  Negative for dizziness.  Hematological: Negative.   Psychiatric/Behavioral: Negative.        Objective:   Physical Exam  Vitals reviewed. Constitutional: She is oriented to person, place, and time. She appears well-developed and well-nourished. No distress.  HENT:  Head: Normocephalic and atraumatic.  Mouth/Throat: No oropharyngeal exudate.  Eyes: Conjunctivae are normal. Right eye exhibits no discharge. Left eye exhibits no discharge. Scleral icterus is present.  Neck: Normal range of motion. Neck supple. No JVD present. No tracheal deviation present. No thyromegaly present.  Cardiovascular: Normal rate, regular rhythm, normal heart sounds and intact distal pulses.  Exam reveals no gallop and no friction rub.   No murmur heard. Pulmonary/Chest: Effort normal and breath sounds normal. No stridor. No respiratory distress. She has no wheezes. She has no rales. She exhibits no tenderness.  Abdominal: Soft. Bowel sounds are normal. She exhibits no distension and no mass. There is no tenderness. There is no rebound and no guarding.   Musculoskeletal: Normal range of motion. She exhibits no edema and no tenderness.  Lymphadenopathy:    She has no cervical adenopathy.  Neurological: She is oriented to person, place, and time.  Skin: Skin is warm, dry and intact. No purpura and no rash noted. Rash is not macular, not papular, not maculopapular, not nodular, not pustular, not vesicular and not urticarial. She is not diaphoretic. No erythema. No pallor.  Psychiatric: Her behavior is normal. Judgment and thought content normal.     Lab Results  Component Value Date   WBC 7.0 07/14/2012   HGB 13.2 07/14/2012   HCT 39.1 07/14/2012   PLT 186.0 07/14/2012   GLUCOSE 101* 07/15/2012   CHOL 167 07/15/2012   TRIG 303.0* 07/15/2012   HDL 19.80* 07/15/2012   LDLDIRECT 65.3 07/15/2012   LDLCALC 123* 09/03/2011   ALT 2038* 07/15/2012   AST 1771* 07/15/2012   NA 133* 07/15/2012   K 3.6 07/15/2012   CL 98 07/15/2012   CREATININE 1.2 07/15/2012   BUN 30* 07/15/2012   CO2 22 07/15/2012   TSH 0.96 04/30/2011   INR 1.4* 07/15/2012   HGBA1C 5.9 03/10/2012       Assessment & Plan:

## 2012-07-28 NOTE — Assessment & Plan Note (Signed)
She appears to be improving I will recheck her liver function today and will intervene if she is developing hepatic failure

## 2012-07-29 ENCOUNTER — Other Ambulatory Visit: Payer: BC Managed Care – PPO | Admitting: Lab

## 2012-07-29 ENCOUNTER — Ambulatory Visit (HOSPITAL_COMMUNITY): Admission: RE | Admit: 2012-07-29 | Payer: BC Managed Care – PPO | Source: Ambulatory Visit

## 2012-07-30 ENCOUNTER — Ambulatory Visit: Payer: BC Managed Care – PPO | Admitting: Oncology

## 2012-07-30 ENCOUNTER — Encounter: Payer: Self-pay | Admitting: Oncology

## 2012-08-01 ENCOUNTER — Telehealth: Payer: Self-pay | Admitting: Internal Medicine

## 2012-08-01 NOTE — Telephone Encounter (Signed)
Pt is aware that Dr. Yetta Barre req for her to have lab work done. Pt stated that she is out of town as this time, pt hung up before I can find out if she is coming to get her lab done or not. Pt is aware of the hour for [facility] lab.

## 2012-08-01 NOTE — Telephone Encounter (Signed)
Why didn't she do the lab work that was ordered on May 5th?

## 2012-08-01 NOTE — Telephone Encounter (Signed)
Kelli Reyes called to say the Hydroxyzine is not helping at all.  She is still itching.  She is in [place].  She would like something else called into the [facility] in [place].  The pharmacy number is [phone]

## 2012-08-04 ENCOUNTER — Other Ambulatory Visit (INDEPENDENT_AMBULATORY_CARE_PROVIDER_SITE_OTHER): Payer: BC Managed Care – PPO

## 2012-08-04 DIAGNOSIS — B169 Acute hepatitis B without delta-agent and without hepatic coma: Secondary | ICD-10-CM

## 2012-08-04 DIAGNOSIS — L299 Pruritus, unspecified: Secondary | ICD-10-CM

## 2012-08-04 DIAGNOSIS — B1910 Unspecified viral hepatitis B without hepatic coma: Secondary | ICD-10-CM

## 2012-08-04 LAB — COMPREHENSIVE METABOLIC PANEL
ALT: 580 U/L — ABNORMAL HIGH (ref 0–35)
AST: 395 U/L — ABNORMAL HIGH (ref 0–37)
Alkaline Phosphatase: 240 U/L — ABNORMAL HIGH (ref 39–117)
CO2: 21 mEq/L (ref 19–32)
Creatinine, Ser: 0.8 mg/dL (ref 0.4–1.2)
GFR: 89.8 mL/min (ref >60.00)
Sodium: 136 mEq/L (ref 135–145)
Total Bilirubin: 17 mg/dL — ABNORMAL HIGH (ref 0.3–1.2)
Total Protein: 7.4 g/dL (ref 6.0–8.3)

## 2012-08-05 ENCOUNTER — Other Ambulatory Visit: Payer: Self-pay | Admitting: Internal Medicine

## 2012-08-05 ENCOUNTER — Telehealth: Payer: Self-pay

## 2012-08-05 DIAGNOSIS — E876 Hypokalemia: Secondary | ICD-10-CM | POA: Insufficient documentation

## 2012-08-05 MED ORDER — HYDROXYZINE HCL 25 MG PO TABS: 25.0000 mg | ORAL_TABLET | Freq: Four times a day (QID) | ORAL | Status: DC | PRN

## 2012-08-05 MED ORDER — POTASSIUM CHLORIDE ER 8 MEQ PO TBCR: 8.0000 meq | EXTENDED_RELEASE_TABLET | Freq: Two times a day (BID) | ORAL | Status: DC

## 2012-08-05 NOTE — Telephone Encounter (Signed)
Called to advise of lab results per MD. Patient c/o itching that has intensified since last office visit and states that taking the hydroxyzine 10 mg every 4 hours has not helped. She would like to know what MD recommends, please advise. Thanks

## 2012-08-05 NOTE — Telephone Encounter (Signed)
Pt.notified

## 2012-08-05 NOTE — Telephone Encounter (Signed)
Message copied by Sandi Mealy on Tue Aug 05, 2012  8:20 AM ------      Message from: Etta Grandchild      Created: Tue Aug 05, 2012  7:11 AM       LA - please let her know that the liver enzymes are better but her potassium level is low ------

## 2012-08-05 NOTE — Telephone Encounter (Signed)
Please advise dosing instructions, pt has been taking 1 every 4 hours

## 2012-08-05 NOTE — Telephone Encounter (Signed)
Phone call from pt stating she needs something for itching. [facility] [address]. I see you recommend a higher does of Hydroxyzine, what does? Please advise.

## 2012-08-05 NOTE — Telephone Encounter (Signed)
A higher dose of hydroxyzine

## 2012-08-05 NOTE — Telephone Encounter (Signed)
New dose sent

## 2012-08-08 ENCOUNTER — Encounter: Payer: Self-pay | Admitting: Internal Medicine

## 2012-08-08 ENCOUNTER — Ambulatory Visit (INDEPENDENT_AMBULATORY_CARE_PROVIDER_SITE_OTHER): Payer: BC Managed Care – PPO | Admitting: Internal Medicine

## 2012-08-08 VITALS — BP 134/82 | HR 98 | Temp 100.6°F | Ht 62.0 in | Wt 152.0 lb

## 2012-08-08 DIAGNOSIS — B1910 Unspecified viral hepatitis B without hepatic coma: Secondary | ICD-10-CM

## 2012-08-08 DIAGNOSIS — B169 Acute hepatitis B without delta-agent and without hepatic coma: Secondary | ICD-10-CM

## 2012-08-08 DIAGNOSIS — L299 Pruritus, unspecified: Secondary | ICD-10-CM

## 2012-08-08 MED ORDER — CHOLESTYRAMINE 4 G PO PACK: 1.0000 | PACK | Freq: Three times a day (TID) | ORAL | Status: DC

## 2012-08-08 MED ORDER — NALTREXONE HCL 50 MG PO TABS: 50.0000 mg | ORAL_TABLET | Freq: Every day | ORAL | Status: DC

## 2012-08-08 NOTE — Progress Notes (Signed)
Subjective:    Patient ID: Kelli Reyes, female    DOB: 06/25/1945, 67 y.o.   MRN: [id number]  HPI  Pt presents to the clinic today with c/o an itchy rash. She saw Dr. Yetta Barre for the same about 1 week ago. She has recently been diagnosed with hepatitis b. Her liver enzymes are coming down, but she has developed pruritius. Dr. Yetta Barre put her Hydroxyzine 10 mg prn. She was still having itching so he increase her hydroxyzine to 25 mg. She is also using benadryl cream but this has not provided any relief. She is wondering if there is anything else that can be done.  Review of Systems      Past Medical History  Diagnosis Date  . COPD (chronic obstructive pulmonary disease)   . GERD (gastroesophageal reflux disease)   . Hypertension   . Osteoarthritis   . Lung nodules     Current Outpatient Prescriptions  Medication Sig Dispense Refill  . mometasone (NASONEX) 50 MCG/ACT nasal spray Place 2 sprays into the nose daily.  17 g  12  . potassium chloride (KLOR-CON) 8 MEQ tablet Take 1 tablet (8 mEq total) by mouth 2 (two) times daily.  60 tablet  5  . tiotropium (SPIRIVA HANDIHALER) 18 MCG inhalation capsule Place 18 mcg into inhaler and inhale daily.       . cholestyramine (QUESTRAN) 4 G packet Take 1 packet by mouth 3 (three) times daily with meals.  60 each  1  . hydrOXYzine (ATARAX/VISTARIL) 25 MG tablet Take 1 tablet (25 mg total) by mouth every 6 (six) hours as needed for itching.  65 tablet  1  . naltrexone (DEPADE) 50 MG tablet Take 1 tablet (50 mg total) by mouth daily.  20 tablet  0   No current facility-administered medications for this visit.    Allergies  Allergen Reactions  . Ibuprofen     REACTION: rash    Family History  Problem Relation Age of Onset  . Hyperlipidemia Other   . Cancer Neg Hx   . Stroke Neg Hx   . Heart disease Neg Hx     History   Social History  . Marital Status: Widowed    Spouse Name: N/A    Number of Children: 4  . Years of Education: N/A    Occupational History  . school bus driver     [place]   Social History Main Topics  . Smoking status: Former Smoker -- 2.00 packs/day for 25 years    Types: Cigarettes    Quit date: 09/03/1996  . Smokeless tobacco: Never Used  . Alcohol Use: No     Comment: beer 2-3 times a week  . Drug Use: No  . Sexually Active: Not Currently   Other Topics Concern  . Not on file   Social History Narrative   No regular exercise           Constitutional: Denies fever, malaise, fatigue, headache or abrupt weight changes.  HEENT: Denies eye pain, eye redness, ear pain, ringing in the ears, wax buildup, runny nose, nasal congestion, bloody nose, or sore throat. Respiratory: Denies difficulty breathing, shortness of breath, cough or sputum production.  .  Skin: Pt reports itchy rash. Denies redness, lesions or ulcercations.     No other specific complaints in a complete review of systems (except as listed in HPI above).  Objective:dro   Physical Exam  BP 134/82  Pulse 98  Temp(Src) 100.6 F (38.1 C) (Oral)  Ht 5\' 2"  (1.575 m)  Wt 152 lb (68.947 kg)  BMI 27.79 kg/m2  SpO2 99% Wt Readings from Last 3 Encounters:  08/08/12 152 lb (68.947 kg)  07/28/12 148 lb 8 oz (67.359 kg)  07/15/12 151 lb (68.493 kg)    General: Appears her stated age, well developed, well nourished in NAD. Skin: Warm, dry and intact. Generalized pruritic rash noted over arms, chest and legs. HEENT: Head: normal shape and size; Eyes: sclera icterus conjunctiva pink, PERRLA and EOMs intact; Ears: Tm's gray and intact, normal light reflex; Nose: mucosa pink and moist, septum midline; Throat/Mouth: Teeth present, mucosa pink and moist, no exudate, lesions or ulcerations noted.  . Cardiovascular: Normal rate and rhythm. S1,S2 noted.  No murmur, rubs or gallops noted. No JVD or BLE edema. No carotid bruits noted. Pulmonary/Chest: Normal effort and positive vesicular breath sounds. No respiratory distress. No  wheezes, rales or ronchi noted.    BMET    Component Value Date/Time   NA 136 08/04/2012 1510   K 3.2* 08/04/2012 1510   CL 107 08/04/2012 1510   CO2 21 08/04/2012 1510   GLUCOSE 135* 08/04/2012 1510   BUN 11 08/04/2012 1510   CREATININE 0.8 08/04/2012 1510   CALCIUM 8.6 08/04/2012 1510   GFRNONAA 86* 02/23/2012 2220   GFRAA >90 02/23/2012 2220    Lipid Panel     Component Value Date/Time   CHOL 167 07/15/2012 1105   TRIG 303.0* 07/15/2012 1105   HDL 19.80* 07/15/2012 1105   CHOLHDL 8 07/15/2012 1105   VLDL 60.6* 07/15/2012 1105   LDLCALC 123* 09/03/2011 0847    CBC    Component Value Date/Time   WBC 7.0 07/14/2012 1002   WBC 5.3 05/22/2010 1015   RBC 4.31 07/14/2012 1002   RBC 4.60 05/22/2010 1015   HGB 13.2 07/14/2012 1002   HGB 14.1 05/22/2010 1015   HCT 39.1 07/14/2012 1002   HCT 41.7 05/22/2010 1015   PLT 186.0 07/14/2012 1002   PLT 225 05/22/2010 1015   MCV 90.5 07/14/2012 1002   MCV 90.6 05/22/2010 1015   MCH 30.3 02/23/2012 2220   MCH 30.5 05/22/2010 1015   MCHC 33.8 07/14/2012 1002   MCHC 33.7 05/22/2010 1015   RDW 13.6 07/14/2012 1002   RDW 12.9 05/22/2010 1015   LYMPHSABS 0.9 02/23/2012 2220   LYMPHSABS 1.0 05/22/2010 1015   MONOABS 0.3 02/23/2012 2220   MONOABS 0.6 05/22/2010 1015   EOSABS 0.1 02/23/2012 2220   EOSABS 0.3 05/22/2010 1015   BASOSABS 0.0 02/23/2012 2220   BASOSABS 0.0 05/22/2010 1015    Hgb A1C Lab Results  Component Value Date   HGBA1C 5.9 03/10/2012         Assessment & Plan:   Pruritic rash secondary to hepatitis b:  Continue hydroxyzine and benadryl cream After talking with Dr. Jonny Ruiz will add Narcan and Questran to see if this helps  RTC if symptoms persist or worsen

## 2012-08-08 NOTE — Patient Instructions (Signed)
Pruritus  Pruritis is an itch. There are many different problems that can cause an itch. Dry skin is one of the most common causes of itching. Most cases of itching do not require medical attention.  HOME CARE INSTRUCTIONS  Make sure your skin is moistened on a regular basis. A moisturizer that contains petroleum jelly is best for keeping moisture in your skin. If you develop a rash, you may try the following for relief:   Use corticosteroid cream.  Apply cool compresses to the affected areas.  Bathe with Epsom salts or baking soda in the bathwater.  Soak in colloidal oatmeal baths. These are available at your pharmacy.  Apply baking soda paste to the rash. Stir water into baking soda until it reaches a paste-like consistency.  Use an anti-itch lotion.  Take over-the-counter diphenhydramine medicine by mouth as the instructions direct.  Avoid scratching. Scratching may cause the rash to become infected. If itching is very bad, your caregiver may suggest prescription lotions or creams to lessen your symptoms.  Avoid hot showers, which can make itching worse. A cold shower may help with itching as long as you use a moisturizer after the shower. SEEK MEDICAL CARE IF: The itching does not go away after several days. Document Released: 11/22/2010 Document Revised: 06/04/2011 Document Reviewed: 11/22/2010 [employer] Patient Information 2013 [employer].  

## 2012-08-28 ENCOUNTER — Ambulatory Visit (INDEPENDENT_AMBULATORY_CARE_PROVIDER_SITE_OTHER): Payer: BC Managed Care – PPO | Admitting: Internal Medicine

## 2012-08-28 ENCOUNTER — Encounter: Payer: Self-pay | Admitting: Internal Medicine

## 2012-08-28 ENCOUNTER — Other Ambulatory Visit (INDEPENDENT_AMBULATORY_CARE_PROVIDER_SITE_OTHER): Payer: BC Managed Care – PPO

## 2012-08-28 DIAGNOSIS — I10 Essential (primary) hypertension: Secondary | ICD-10-CM

## 2012-08-28 DIAGNOSIS — E876 Hypokalemia: Secondary | ICD-10-CM

## 2012-08-28 DIAGNOSIS — B1910 Unspecified viral hepatitis B without hepatic coma: Secondary | ICD-10-CM

## 2012-08-28 LAB — COMPREHENSIVE METABOLIC PANEL
CO2: 24 mEq/L (ref 19–32)
Calcium: 9.6 mg/dL (ref 8.4–10.5)
GFR: 78.62 mL/min (ref >60.00)
Glucose, Bld: 117 mg/dL — ABNORMAL HIGH (ref 70–99)
Sodium: 142 mEq/L (ref 135–145)
Total Bilirubin: 1.9 mg/dL — ABNORMAL HIGH (ref 0.3–1.2)
Total Protein: 7.6 g/dL (ref 6.0–8.3)

## 2012-08-28 MED ORDER — LOSARTAN POTASSIUM-HCTZ 50-12.5 MG PO TABS: 1.0000 | ORAL_TABLET | Freq: Every day | ORAL | Status: DC

## 2012-08-28 NOTE — Patient Instructions (Signed)

## 2012-08-28 NOTE — Assessment & Plan Note (Signed)
She appears to be doing much better I will recheck her LFT/s and will look at the Hep B viral load as well to consider whether or not she will need to be treated

## 2012-08-28 NOTE — Assessment & Plan Note (Signed)
This has resolved.

## 2012-08-28 NOTE — Assessment & Plan Note (Signed)
Her BP is not well controlled so I have asked her to start Hyzaar

## 2012-08-28 NOTE — Progress Notes (Signed)
  Subjective:    Patient ID: Kelli Reyes, female    DOB: [birth year], 67 y.o.   MRN: [id number]  Hypertension This is a chronic problem. The current episode started more than 1 year ago. The problem is unchanged. The problem is uncontrolled. Pertinent negatives include no anxiety, blurred vision, chest pain, headaches, malaise/fatigue, neck pain, orthopnea, palpitations, peripheral edema, PND, shortness of breath or sweats. Past treatments include angiotensin blockers and diuretics. The current treatment provides moderate improvement. Compliance problems include exercise and diet.       Review of Systems  Constitutional: Negative.  Negative for fever, chills, malaise/fatigue, diaphoresis, activity change, appetite change, fatigue and unexpected weight change.  HENT: Negative.  Negative for neck pain.   Eyes: Negative.  Negative for blurred vision.  Respiratory: Negative.  Negative for cough, chest tightness, shortness of breath, wheezing and stridor.   Cardiovascular: Negative.  Negative for chest pain, palpitations, orthopnea, leg swelling and PND.  Gastrointestinal: Negative.  Negative for nausea, vomiting, abdominal pain, diarrhea and constipation.  Endocrine: Negative.   Genitourinary: Negative.   Musculoskeletal: Negative.   Skin: Negative.   Allergic/Immunologic: Negative.   Neurological: Negative.  Negative for headaches.  Hematological: Negative.  Negative for adenopathy. Does not bruise/bleed easily.  Psychiatric/Behavioral: Negative.        Objective:   Physical Exam  Vitals reviewed. Constitutional: She is oriented to person, place, and time. She appears well-developed and well-nourished. No distress.  HENT:  Head: Normocephalic and atraumatic.  Mouth/Throat: Oropharynx is clear and moist. No oropharyngeal exudate.  Eyes: Conjunctivae are normal. Right eye exhibits no discharge. No scleral icterus.  Neck: Normal range of motion. Neck supple. No JVD present. No tracheal  deviation present. No thyromegaly present.  Cardiovascular: Normal rate, regular rhythm, normal heart sounds and intact distal pulses.  Exam reveals no gallop and no friction rub.   No murmur heard. Pulmonary/Chest: Effort normal and breath sounds normal. No stridor. No respiratory distress. She has no wheezes. She has no rales. She exhibits no tenderness.  Abdominal: Soft. Bowel sounds are normal. She exhibits no distension and no mass. There is no tenderness. There is no rebound and no guarding.  Musculoskeletal: Normal range of motion. She exhibits no edema and no tenderness.  Lymphadenopathy:    She has no cervical adenopathy.  Neurological: She is oriented to person, place, and time.  Skin: Skin is warm and dry. No rash noted. She is not diaphoretic. No erythema. No pallor.  Psychiatric: She has a normal mood and affect. Her behavior is normal. Judgment and thought content normal.     Lab Results  Component Value Date   WBC 7.0 07/14/2012   HGB 13.2 07/14/2012   HCT 39.1 07/14/2012   PLT 186.0 07/14/2012   GLUCOSE 135* 08/04/2012   CHOL 167 07/15/2012   TRIG 303.0* 07/15/2012   HDL 19.80* 07/15/2012   LDLDIRECT 65.3 07/15/2012   LDLCALC 123* 09/03/2011   ALT 580* 08/04/2012   AST 395* 08/04/2012   NA 136 08/04/2012   K 3.2* 08/04/2012   CL 107 08/04/2012   CREATININE 0.8 08/04/2012   BUN 11 08/04/2012   CO2 21 08/04/2012   TSH 0.96 04/30/2011   INR 1.1* 08/04/2012   HGBA1C 5.9 03/10/2012       Assessment & Plan:

## 2012-09-07 ENCOUNTER — Other Ambulatory Visit: Payer: Self-pay | Admitting: Internal Medicine

## 2012-10-07 ENCOUNTER — Other Ambulatory Visit: Payer: Self-pay | Admitting: *Deleted

## 2012-10-07 MED ORDER — TIOTROPIUM BROMIDE MONOHYDRATE 18 MCG IN CAPS: 18.0000 ug | ORAL_CAPSULE | Freq: Every day | RESPIRATORY_TRACT | Status: DC

## 2012-10-17 ENCOUNTER — Ambulatory Visit: Payer: BC Managed Care – PPO | Admitting: Internal Medicine

## 2012-10-29 ENCOUNTER — Telehealth: Payer: Self-pay | Admitting: Internal Medicine

## 2012-10-29 ENCOUNTER — Other Ambulatory Visit: Payer: Self-pay | Admitting: Internal Medicine

## 2012-10-29 ENCOUNTER — Encounter: Payer: Self-pay | Admitting: Internal Medicine

## 2012-10-29 NOTE — Telephone Encounter (Signed)
Patient is requesting pain meds for hydrocodone.  She said she had previously been prescribed this medication and she did not want to make an appointment at this time.

## 2012-11-08 ENCOUNTER — Other Ambulatory Visit: Payer: Self-pay | Admitting: Internal Medicine

## 2012-12-01 ENCOUNTER — Other Ambulatory Visit (INDEPENDENT_AMBULATORY_CARE_PROVIDER_SITE_OTHER): Payer: BC Managed Care – PPO

## 2012-12-01 ENCOUNTER — Encounter: Payer: Self-pay | Admitting: Internal Medicine

## 2012-12-01 ENCOUNTER — Ambulatory Visit (INDEPENDENT_AMBULATORY_CARE_PROVIDER_SITE_OTHER)
Admission: RE | Admit: 2012-12-01 | Discharge: 2012-12-01 | Disposition: A | Discharge status: Home / Self Care | Payer: BC Managed Care – PPO | Source: Ambulatory Visit | Attending: Internal Medicine | Admitting: Internal Medicine

## 2012-12-01 ENCOUNTER — Ambulatory Visit (INDEPENDENT_AMBULATORY_CARE_PROVIDER_SITE_OTHER): Payer: BC Managed Care – PPO | Admitting: Internal Medicine

## 2012-12-01 VITALS — BP 112/70 | HR 92 | Temp 98.5°F | Resp 16 | Wt 152.0 lb

## 2012-12-01 DIAGNOSIS — R918 Other nonspecific abnormal finding of lung field: Secondary | ICD-10-CM

## 2012-12-01 DIAGNOSIS — R109 Unspecified abdominal pain: Secondary | ICD-10-CM

## 2012-12-01 DIAGNOSIS — Z1231 Encounter for screening mammogram for malignant neoplasm of breast: Secondary | ICD-10-CM

## 2012-12-01 DIAGNOSIS — D472 Monoclonal gammopathy: Secondary | ICD-10-CM

## 2012-12-01 DIAGNOSIS — R7309 Other abnormal glucose: Secondary | ICD-10-CM

## 2012-12-01 DIAGNOSIS — M1990 Unspecified osteoarthritis, unspecified site: Secondary | ICD-10-CM

## 2012-12-01 DIAGNOSIS — I10 Essential (primary) hypertension: Secondary | ICD-10-CM

## 2012-12-01 DIAGNOSIS — B169 Acute hepatitis B without delta-agent and without hepatic coma: Secondary | ICD-10-CM

## 2012-12-01 DIAGNOSIS — Z23 Encounter for immunization: Secondary | ICD-10-CM

## 2012-12-01 DIAGNOSIS — B1910 Unspecified viral hepatitis B without hepatic coma: Secondary | ICD-10-CM

## 2012-12-01 DIAGNOSIS — G894 Chronic pain syndrome: Secondary | ICD-10-CM

## 2012-12-01 DIAGNOSIS — E876 Hypokalemia: Secondary | ICD-10-CM

## 2012-12-01 LAB — COMPREHENSIVE METABOLIC PANEL
ALT: 26 U/L (ref 0–35)
AST: 28 U/L (ref 0–37)
Albumin: 3.9 g/dL (ref 3.5–5.2)
CO2: 28 mEq/L (ref 19–32)
Calcium: 9.6 mg/dL (ref 8.4–10.5)
Chloride: 108 mEq/L (ref 96–112)
Creatinine, Ser: 0.9 mg/dL (ref 0.4–1.2)
GFR: 83.79 mL/min (ref >60.00)
Potassium: 4.3 mEq/L (ref 3.5–5.1)
Sodium: 142 mEq/L (ref 135–145)
Total Protein: 7.4 g/dL (ref 6.0–8.3)

## 2012-12-01 LAB — HEMOGLOBIN A1C: Hgb A1c MFr Bld: 5.7 % (ref 4.6–6.5)

## 2012-12-01 MED ORDER — LOSARTAN POTASSIUM-HCTZ 50-12.5 MG PO TABS: 1.0000 | ORAL_TABLET | Freq: Every day | ORAL | Status: DC

## 2012-12-01 MED ORDER — HYDROCODONE-ACETAMINOPHEN 5-325 MG PO TABS: 1.0000 | ORAL_TABLET | Freq: Four times a day (QID) | ORAL | Status: DC | PRN

## 2012-12-01 MED ORDER — POTASSIUM CHLORIDE ER 8 MEQ PO TBCR: 8.0000 meq | EXTENDED_RELEASE_TABLET | Freq: Two times a day (BID) | ORAL | Status: DC

## 2012-12-01 NOTE — Patient Instructions (Signed)
Hepatitis B  Hepatitis B is a viral infection of the liver. Over half the people who become infected with hepatitis B never feel sick. However, some may later develop long-term liver disease (chronic hepatitis). There are 2 phases of the disease: sudden (acute) and longstanding (chronic).  CAUSES  Hepatitis B is caused by the hepatitis B virus (HBV). It can enter the body by sharing needles contaminated with blood from an infected person, by sharing intimate items such as toothbrushes and razors, or by sex with an infected person. A baby can get HBV from its birth mother. A caregiver may also get it from exposure to the blood of an infected patient by way of a cut or needle stick.   SYMPTOMS  Acute Phase  Many cases of acute HBV infection are mild and cause few problems. Some people may not even realize they are sick. Symptoms in others may last a few weeks to several months and include:  · Loss of appetite.  · Feeling very tired.  · Nausea.  · Vomiting.  · Abdominal pain.  · Dark yellow urine.  · Yellow skin and eyes (jaundice).  Chronic Phase  · About 5% of people who get HBV infection become "chronic carriers." They often have no symptoms, but the virus stays in their body. They may spread the virus to others and can get long-term liver disease. The younger a child is when the infection starts, the more likely that child will be a carrier.  · About 25% of chronic HBV carriers get a disease called "chronic active hepatitis." These people may develop scarring of the liver (cirrhosis), liver failure, or liver cancer.  DIAGNOSIS  Your caregiver can do a blood test to see if you have the disease.  TREATMENT  Acute hepatitis B does not usually require any drug treatment. It is important to avoid medicines such as acetaminophen that may cause increasing liver damage.   Treatment with many antiviral drugs is available and recommended for some patients with hepatitis B infection. The goal is to reduce the risk of  progressive chronic liver disease, transmission of infection to others, and other long-term complications such as cirrhosis, liver failure, and liver cancer. Drug treatment is often advised for people with:  · Acute liver failure.  · Clinical complications of cirrhosis.  · Cirrhosis or advanced fibrosis with high serum measurements of viral DNA.  · Reactivation of chronic HBV after chemotherapy or immunosuppression.  Immediate drug treatment is not often advised for patients who have chronic infection but normal liver enzyme tests or patients who have a positive hepatitis B DNA test in blood but no other signs of active infection. Patients may have other circumstances that suggest a need or potential benefit from drug treatment.  Successful treatment currently requires taking treatment drugs over a long period of time. An injected drug (interferon) may be given daily, 3 times a week, or once weekly for up to 1 year. An oral drug treatment plan may require daily dosing for many years or indefinitely, in order to prevent infection reactivation and worsening of liver disease. Side effects from these drugs are common and some may be very serious. Your response to treatment must be carefully monitored by both you and your caregiver throughout the entire treatment period.  PREVENTION  Hepatitis B vaccine is highly effective in preventing a hepatitis B infection. The vaccine is recommended worldwide for all newborns of hepatitis B infected mothers, and in many countries for all newborns. Hepatitis B vaccine is also recommended   in the [place] for other people at higher than normal risk of getting an infection, including:  · Sexually active people with multiple sex partners.  · Homosexual and bisexual men.  · People who live with someone who has hepatitis B.  · Injection drug users.  · Healthcare workers.  · Patients on chronic hemodialysis and patients who need repeated blood or blood product transfusions.  · Patients with  chronic liver disease due to any cause.  · Unvaccinated people traveling to areas with high levels of local HBV infection.  · Patients with diabetes.  Hepatitis B immune globulin (HBIG) is often given with hepatitis B vaccine to people who have been exposed to blood contaminated with HBV. The HBIG protects you from the virus for the first 1 to 3 months. After that, the hepatitis B vaccine takes over and gives you long-term protection. Your caregiver will help you decide whether and when to get these shots following exposure to HBV.  Healthcare workers need to avoid injuries and wear appropriate protective equipment such as gloves, gowns, and face masks when performing invasive medical or nursing procedures.   HOME CARE INSTRUCTIONS   · Rest when you feel tired, and eat when you are hungry.  · Avoid a sexual relationship until advised otherwise by your caregiver.  · Avoid activities that could expose other people to your blood. Examples include sharing a toothbrush, nail clippers, razors, and needles.  · This infection is contagious. Follow your caregiver's instructions in order to avoid spread of the infection.  · Do not take any medicines until your caregiver says it is okay. This includes over-the-counter drugs such as acetominophen that are usually taken for fever or pain.  SEEK IMMEDIATE MEDICAL CARE IF:   · You are unable to eat or drink.  · You feel sick to your stomach (nauseous) or throw up (vomit).  · You feel confused.  · Jaundice becomes more severe.  · You have trouble breathing, a rash, or swelling of the skin, throat, mouth, or face. You may be having an allergic reaction to the medicine in the shot.  · You start twitching or shaking (seizure).  · You become very sleepy or have trouble waking up.  MAKE SURE YOU:   · Understand these instructions.  · Will watch your condition.  · Will get help right away if you are not doing well or get worse.  Document Released: 03/09/2000 Document Revised: 06/04/2011  Document Reviewed: 07/11/2010  [employer]® Patient Information ©2014 [employer].

## 2012-12-01 NOTE — Assessment & Plan Note (Signed)
I will check her Hep B DNA PCR to see if this has resolved

## 2012-12-01 NOTE — Assessment & Plan Note (Signed)
She will continue the current meds for pain 

## 2012-12-01 NOTE — Assessment & Plan Note (Signed)
Stable cxr noted

## 2012-12-01 NOTE — Assessment & Plan Note (Signed)
No changes noted o/t the progression of DJD

## 2012-12-01 NOTE — Assessment & Plan Note (Signed)
Her BP is well controlled Today I will check her lytes and renal function 

## 2012-12-01 NOTE — Assessment & Plan Note (Signed)
I will check her A1C to see if she has developed DM2 

## 2012-12-01 NOTE — Progress Notes (Signed)
Subjective:    Patient ID: Kelli Reyes, female    DOB: [birth year], 67 y.o.   MRN: [id number]  Arthritis Presents for follow-up visit. The disease course has been stable. The condition has lasted for 3 years. She complains of pain. She reports no stiffness, joint swelling or joint warmth. The symptoms have been stable. Affected locations include the right hip and left hip. Her pain is at a severity of 3/10. Associated symptoms include pain at night and pain while resting. Pertinent negatives include no diarrhea, dry eyes, dry mouth, dysuria, fatigue, fever, rash, Raynaud's syndrome, uveitis or weight loss. Her past medical history is significant for osteoarthritis. Her pertinent risk factors include overuse. Past treatments include acetaminophen, NSAIDs and an opioid. The treatment provided moderate relief. Factors aggravating her arthritis include activity. Compliance with prior treatments has been good.      Review of Systems  Constitutional: Negative.  Negative for fever, chills, weight loss, diaphoresis, activity change, appetite change, fatigue and unexpected weight change.  HENT: Negative.   Eyes: Negative.   Respiratory: Negative.  Negative for cough, choking, chest tightness, shortness of breath, wheezing and stridor.   Cardiovascular: Negative.  Negative for chest pain, palpitations and leg swelling.  Gastrointestinal: Negative.  Negative for nausea, vomiting, abdominal pain, diarrhea, constipation, abdominal distention and rectal pain.  Endocrine: Negative.   Genitourinary: Negative.  Negative for dysuria.  Musculoskeletal: Positive for arthritis. Negative for myalgias, back pain, joint swelling, gait problem and stiffness.  Skin: Negative.  Negative for color change, pallor, rash and wound.  Allergic/Immunologic: Negative.   Neurological: Negative.  Negative for dizziness, tremors, seizures, syncope, facial asymmetry, speech difficulty, weakness, light-headedness, numbness and  headaches.  Hematological: Negative.  Negative for adenopathy. Does not bruise/bleed easily.  Psychiatric/Behavioral: Negative.        Objective:   Physical Exam  Vitals reviewed. Constitutional: She is oriented to person, place, and time. She appears well-developed and well-nourished. No distress.  HENT:  Head: Normocephalic and atraumatic.  Mouth/Throat: Oropharynx is clear and moist. No oropharyngeal exudate.  Eyes: Conjunctivae are normal. Right eye exhibits no discharge. Left eye exhibits no discharge. No scleral icterus.  Neck: Normal range of motion. Neck supple. No JVD present. No tracheal deviation present. No thyromegaly present.  Cardiovascular: Normal rate, regular rhythm, normal heart sounds and intact distal pulses.  Exam reveals no gallop and no friction rub.   No murmur heard. Pulmonary/Chest: Effort normal and breath sounds normal. No stridor. No respiratory distress. She has no wheezes. She has no rales. She exhibits no tenderness.  Abdominal: Soft. Bowel sounds are normal. She exhibits no distension and no mass. There is no tenderness. There is no rebound and no guarding.  Musculoskeletal: Normal range of motion. She exhibits no edema and no tenderness.       Right hip: Normal. She exhibits normal range of motion, normal strength, no tenderness, no bony tenderness, no swelling, no crepitus, no deformity and no laceration.  Lymphadenopathy:    She has no cervical adenopathy.  Neurological: She is oriented to person, place, and time.  Skin: Skin is warm and dry. No rash noted. She is not diaphoretic. No erythema. No pallor.  Psychiatric: She has a normal mood and affect. Her behavior is normal. Judgment and thought content normal.     Lab Results  Component Value Date   WBC 7.0 07/14/2012   HGB 13.2 07/14/2012   HCT 39.1 07/14/2012   PLT 186.0 07/14/2012   GLUCOSE 117* 08/28/2012   CHOL  167 07/15/2012   TRIG 303.0* 07/15/2012   HDL 19.80* 07/15/2012   LDLDIRECT 65.3  07/15/2012   LDLCALC 123* 09/03/2011   ALT 27 08/28/2012   AST 28 08/28/2012   NA 142 08/28/2012   K 3.8 08/28/2012   CL 108 08/28/2012   CREATININE 0.9 08/28/2012   BUN 16 08/28/2012   CO2 24 08/28/2012   TSH 0.96 04/30/2011   INR 1.1* 08/04/2012   HGBA1C 5.9 03/10/2012       Assessment & Plan:

## 2012-12-01 NOTE — Assessment & Plan Note (Signed)
She needs a f/up with hematology

## 2012-12-02 ENCOUNTER — Encounter: Payer: Self-pay | Admitting: Internal Medicine

## 2012-12-04 ENCOUNTER — Other Ambulatory Visit: Payer: Self-pay | Admitting: *Deleted

## 2012-12-04 ENCOUNTER — Telehealth: Payer: Self-pay | Admitting: Hematology and Oncology

## 2012-12-04 NOTE — Telephone Encounter (Signed)
, °

## 2012-12-04 NOTE — Progress Notes (Signed)
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 Sanda Linger.  Order sent to scheduling to see pt w/i one month.

## 2012-12-17 ENCOUNTER — Ambulatory Visit: Payer: BC Managed Care – PPO | Admitting: Hematology and Oncology

## 2012-12-17 ENCOUNTER — Telehealth: Payer: Self-pay | Admitting: Oncology

## 2012-12-17 NOTE — Telephone Encounter (Signed)
called to cx and will call back to r/s

## 2013-01-19 ENCOUNTER — Ambulatory Visit: Payer: BC Managed Care – PPO

## 2013-01-22 ENCOUNTER — Telehealth: Payer: Self-pay | Admitting: Internal Medicine

## 2013-01-22 DIAGNOSIS — R109 Unspecified abdominal pain: Secondary | ICD-10-CM

## 2013-01-22 MED ORDER — HYDROCODONE-ACETAMINOPHEN 5-325 MG PO TABS: 1.0000 | ORAL_TABLET | Freq: Four times a day (QID) | ORAL | Status: DC | PRN

## 2013-01-22 NOTE — Telephone Encounter (Signed)
Pt notified//lmovm 

## 2013-01-22 NOTE — Telephone Encounter (Signed)
Pt is requesting to pick up an RX for hydrocodone.

## 2013-01-22 NOTE — Telephone Encounter (Signed)
done

## 2013-01-28 ENCOUNTER — Telehealth: Payer: Self-pay | Admitting: Hematology and Oncology

## 2013-01-28 NOTE — Telephone Encounter (Signed)
pt daughter called to r/s cx appt...done

## 2013-02-04 ENCOUNTER — Telehealth: Payer: Self-pay | Admitting: Internal Medicine

## 2013-02-04 NOTE — Telephone Encounter (Signed)
Samples given to pt 

## 2013-02-04 NOTE — Telephone Encounter (Signed)
02/04/2013  Pt is in lobby requesting samples of tiotropium (SPIRIVA HANDIHALER) 18 MCG inhalation.  Pt said she will wait in lobby for it.

## 2013-03-02 ENCOUNTER — Ambulatory Visit: Payer: BC Managed Care – PPO | Admitting: Internal Medicine

## 2013-03-11 ENCOUNTER — Telehealth: Payer: Self-pay | Admitting: Hematology and Oncology

## 2013-03-11 NOTE — Telephone Encounter (Signed)
s.w. pt and r/s est appt done

## 2013-03-16 ENCOUNTER — Ambulatory Visit: Payer: BC Managed Care – PPO | Admitting: Hematology and Oncology

## 2013-03-17 ENCOUNTER — Telehealth: Payer: Self-pay | Admitting: Hematology and Oncology

## 2013-03-17 NOTE — Telephone Encounter (Signed)
pt cx appt will call back to r/s

## 2013-03-18 ENCOUNTER — Ambulatory Visit: Payer: BC Managed Care – PPO | Admitting: Hematology and Oncology

## 2013-03-20 ENCOUNTER — Emergency Department (HOSPITAL_COMMUNITY)
Admission: EM | Admit: 2013-03-20 | Discharge: 2013-03-20 | Disposition: A | Discharge status: Home / Self Care | Payer: BC Managed Care – PPO | Attending: Emergency Medicine | Admitting: Emergency Medicine

## 2013-03-20 ENCOUNTER — Encounter (HOSPITAL_COMMUNITY): Payer: Self-pay | Admitting: Emergency Medicine

## 2013-03-20 ENCOUNTER — Other Ambulatory Visit: Payer: Self-pay

## 2013-03-20 ENCOUNTER — Emergency Department (HOSPITAL_COMMUNITY): Payer: BC Managed Care – PPO

## 2013-03-20 DIAGNOSIS — R000 Tachycardia, unspecified: Secondary | ICD-10-CM | POA: Insufficient documentation

## 2013-03-20 DIAGNOSIS — I10 Essential (primary) hypertension: Secondary | ICD-10-CM | POA: Insufficient documentation

## 2013-03-20 DIAGNOSIS — Z87891 Personal history of nicotine dependence: Secondary | ICD-10-CM | POA: Insufficient documentation

## 2013-03-20 DIAGNOSIS — Z79899 Other long term (current) drug therapy: Secondary | ICD-10-CM | POA: Insufficient documentation

## 2013-03-20 DIAGNOSIS — R05 Cough: Secondary | ICD-10-CM

## 2013-03-20 DIAGNOSIS — M1990 Unspecified osteoarthritis, unspecified site: Secondary | ICD-10-CM | POA: Insufficient documentation

## 2013-03-20 DIAGNOSIS — J441 Chronic obstructive pulmonary disease with (acute) exacerbation: Secondary | ICD-10-CM

## 2013-03-20 DIAGNOSIS — Z8719 Personal history of other diseases of the digestive system: Secondary | ICD-10-CM | POA: Insufficient documentation

## 2013-03-20 DIAGNOSIS — R0602 Shortness of breath: Secondary | ICD-10-CM

## 2013-03-20 LAB — CBC
HCT: 43.1 % (ref 36.0–46.0)
Hemoglobin: 14.4 g/dL (ref 12.0–15.0)
MCHC: 33.4 g/dL (ref 30.0–36.0)
MCV: 92.3 fL (ref 78.0–100.0)
RDW: 13 % (ref 11.5–15.5)

## 2013-03-20 LAB — BASIC METABOLIC PANEL
BUN: 18 mg/dL (ref 6–23)
GFR calc Af Amer: 65 mL/min — ABNORMAL LOW (ref >90)
GFR calc non Af Amer: 56 mL/min — ABNORMAL LOW (ref >90)
Glucose, Bld: 91 mg/dL (ref 70–99)
Potassium: 3.9 mEq/L (ref 3.5–5.1)

## 2013-03-20 LAB — POCT I-STAT TROPONIN I

## 2013-03-20 MED ORDER — ALBUTEROL SULFATE HFA 108 (90 BASE) MCG/ACT IN AERS
2.0000 | INHALATION_SPRAY | Freq: Once | RESPIRATORY_TRACT | Status: AC
  Administered 2013-03-20: 2 via RESPIRATORY_TRACT
  Filled 2013-03-20: qty 6.7

## 2013-03-20 MED ORDER — ALBUTEROL SULFATE (5 MG/ML) 0.5% IN NEBU
5.0000 mg | INHALATION_SOLUTION | Freq: Once | RESPIRATORY_TRACT | Status: AC
  Administered 2013-03-20: 5 mg via RESPIRATORY_TRACT
  Filled 2013-03-20: qty 1

## 2013-03-20 MED ORDER — IPRATROPIUM BROMIDE 0.02 % IN SOLN
0.5000 mg | Freq: Once | RESPIRATORY_TRACT | Status: AC
  Administered 2013-03-20: 0.5 mg via RESPIRATORY_TRACT
  Filled 2013-03-20: qty 2.5

## 2013-03-20 MED ORDER — PREDNISONE 20 MG PO TABS: ORAL_TABLET | ORAL | Status: DC

## 2013-03-20 MED ORDER — PREDNISONE 20 MG PO TABS
60.0000 mg | ORAL_TABLET | Freq: Once | ORAL | Status: AC
  Administered 2013-03-20: 60 mg via ORAL
  Filled 2013-03-20: qty 3

## 2013-03-20 NOTE — ED Provider Notes (Signed)
Medical screening examination/treatment/procedure(s) were conducted as a shared visit with non-physician practitioner(s) and myself.  I personally evaluated the patient during the encounter.  EKG Interpretation   None       Date: 03/20/2013  Rate: 108  Rhythm: sinus tachycardia  QRS Axis: normal  Intervals: normal  ST/T Wave abnormalities: normal  Conduction Disutrbances:none  Narrative Interpretation:   Old EKG Reviewed: none available    Kelli Reyes is a 67 y.o. female hx of COPD here with cough, wheezing, SOB. Cough for several days, SOB x 2 days. Afebrile here. Given 1 neb and steroids and wheezing improved. Denies recent admissions for COPD. Never hypoxic. On exam, mild wheezing after 1 neb. Will give another neb, give steroids, d/c home on steroids, nebs.    Richardean Canal, MD 03/20/13 [phone]

## 2013-03-20 NOTE — ED Provider Notes (Signed)
CSN: [id number]     Arrival date & time 03/20/13  1130 History   First MD Initiated Contact with Patient 03/20/13 1221     Chief Complaint  Patient presents with  . Shortness of Breath   (Consider location/radiation/quality/duration/timing/severity/associated sxs/prior Treatment) HPI Comments: Patient is a 67 year old female with a past medical history of COPD, GERD, hypertension, away and lung nodules who presents to the emergency department complaining of shortness of breath x2 days. Admits to associated productive cough with mucus, chest congestion. She has tried using her nebulizer treatment and inhaler without relief. Also admits to an episode of chest pain 3 days ago that lasted "only a few minutes" that was located in the center of her chest and felt like someone was sitting on her chest. This symptom has not returned. Denies fever, chills, nausea or vomiting.  Patient is a 66 y.o. female presenting with shortness of breath. The history is provided by the patient.  Shortness of Breath Associated symptoms: chest pain, cough and wheezing     Past Medical History  Diagnosis Date  . COPD (chronic obstructive pulmonary disease)   . GERD (gastroesophageal reflux disease)   . Hypertension   . Osteoarthritis   . Lung nodules    Past Surgical History  Procedure Laterality Date  . Abdominal hysterectomy     Family History  Problem Relation Age of Onset  . Hyperlipidemia Other   . Cancer Neg Hx   . Stroke Neg Hx   . Heart disease Neg Hx    History  Substance Use Topics  . Smoking status: Former Smoker -- 2.00 packs/day for 25 years    Types: Cigarettes    Quit date: 09/03/1996  . Smokeless tobacco: Never Used  . Alcohol Use: No     Comment: beer 2-3 times a week   OB History   Grav Para Term Preterm Abortions TAB SAB Ect Mult Living                 Review of Systems  Respiratory: Positive for cough, shortness of breath and wheezing.   Cardiovascular: Positive for chest  pain.  All other systems reviewed and are negative.    Allergies  Ibuprofen  Home Medications   Current Outpatient Rx  Name  Route  Sig  Dispense  Refill  . fexofenadine (ALLEGRA) 180 MG tablet      TAKE ONE (1) TABLET EACH DAY   90 tablet   1   . HYDROcodone-acetaminophen (NORCO/VICODIN) 5-325 MG per tablet   Oral   Take 1 tablet by mouth every 6 (six) hours as needed for pain.   100 tablet   0   . losartan-hydrochlorothiazide (HYZAAR) 50-12.5 MG per tablet   Oral   Take 1 tablet by mouth daily.   90 tablet   1   . potassium chloride (KLOR-CON) 8 MEQ tablet   Oral   Take 1 tablet (8 mEq total) by mouth 2 (two) times daily.   60 tablet   5   . pseudoephedrine-guaifenesin (MUCINEX D) 60-600 MG per tablet   Oral   Take 1 tablet by mouth 2 (two) times daily as needed for congestion.         Marland Kitchen tiotropium (SPIRIVA HANDIHALER) 18 MCG inhalation capsule   Inhalation   Place 1 capsule (18 mcg total) into inhaler and inhale daily.   30 capsule   2   . EXPIRED: mometasone (NASONEX) 50 MCG/ACT nasal spray   Nasal   Place 2  sprays into the nose daily.   17 g   12   . predniSONE (DELTASONE) 20 MG tablet      Take 3 tabs PO x 2 days followed by 2 tabs PO x 2 days followed by 1 tab PO x 2 days   12 tablet   0    BP 154/70  Pulse 117  Temp(Src) 98.8 F (37.1 C) (Oral)  SpO2 97% Physical Exam  Nursing note and vitals reviewed. Constitutional: She is oriented to person, place, and time. She appears well-developed and well-nourished. No distress.  HENT:  Head: Normocephalic and atraumatic.  Mouth/Throat: Oropharynx is clear and moist.  Eyes: Conjunctivae and EOM are normal. Pupils are equal, round, and reactive to light.  Neck: Normal range of motion. Neck supple.  Cardiovascular: Regular rhythm and normal heart sounds.  Tachycardia present.   Pulmonary/Chest: Tachypnea noted. She has wheezes.  Poor air movement. Scattered expiratory wheezes. Audible wheezing  while speaking.  Abdominal: Soft. Bowel sounds are normal. There is no tenderness.  Musculoskeletal: Normal range of motion. She exhibits no edema.  Neurological: She is alert and oriented to person, place, and time.  Skin: Skin is warm and dry. She is not diaphoretic.  Psychiatric: She has a normal mood and affect. Her behavior is normal.    ED Course  Procedures (including critical care time) Labs Review Labs Reviewed  BASIC METABOLIC PANEL - Abnormal; Notable for the following:    GFR calc non Af Amer 56 (*)    GFR calc Af Amer 65 (*)    All other components within normal limits  CBC  POCT I-STAT TROPONIN I   Imaging Review Dg Chest 2 View  03/20/2013   CLINICAL DATA:  Shortness of breath, chest heaviness for 2 days, history COPD, hypertension  EXAM: CHEST  2 VIEW  COMPARISON:  12/01/2012  FINDINGS: Normal heart size, mediastinal contours and pulmonary vascularity.  Atherosclerotic calcification aortic arch.  Calcified mediastinal and bilateral hilar lymph nodes.  Linear chronic subsegmental atelectasis versus scarring at minor fissure.  Calcified granuloma lateral mid to lower left right chest unchanged.  Chronic peribronchial thickening and slight accentuation of interstitial markings similar to previous exam.  No definite acute infiltrate, pleural effusion or pneumothorax.  No acute osseous findings.  IMPRESSION: Old granulomatous disease.  Bronchitic changes with chronic atelectasis versus scarring at minor fissure.  No definite acute abnormalities.   Electronically Signed   By: Ulyses Southward M.D.   On: 03/20/2013 14:20    EKG Interpretation   None       MDM   1. COPD exacerbation   2. Shortness of breath   3. Cough     Presenting with shortness of breath and cough. Poor air movement, scattered expiratory wheezes. She is afebrile, tachycardic and tachypneic. Wet sounding harsh cough present. Chest x-ray and labs pending. Will give DuoNeb, prednisone, re-assess. 3:15 PM Pt  reported great improvement after first neb tx. Still with expiratory wheezes, but air movement improved. Prednisone and second neb tx given, pt reports even greater improvement. HR decreased to 105. No longer tachypneic. Labs without any acute finding. Chest x-ray showing bronchitic changes, no acute abnormalities. No leukocytosis or fever. She is stable for discharge home, will give prednisone taper, refill inhaler. Return precautions given. Patient states understanding of treatment care plan and is agreeable. Case discussed with attending Dr. Silverio Lay who also evaluated patient and agrees with plan of care.   Trevor Mace, PA-C 03/20/13 [phone]

## 2013-03-20 NOTE — ED Notes (Signed)
Patient transported to X-ray 

## 2013-03-20 NOTE — ED Notes (Signed)
C/o SOB x 2 days, denies chest pain, productive cough, congestion

## 2013-03-27 ENCOUNTER — Encounter: Payer: Self-pay | Admitting: Internal Medicine

## 2013-03-27 ENCOUNTER — Ambulatory Visit (INDEPENDENT_AMBULATORY_CARE_PROVIDER_SITE_OTHER): Payer: BC Managed Care – PPO | Admitting: Internal Medicine

## 2013-03-27 VITALS — BP 150/92 | HR 102 | Temp 97.5°F | Resp 20 | Wt 156.0 lb

## 2013-03-27 DIAGNOSIS — I10 Essential (primary) hypertension: Secondary | ICD-10-CM

## 2013-03-27 DIAGNOSIS — Z23 Encounter for immunization: Secondary | ICD-10-CM

## 2013-03-27 DIAGNOSIS — G894 Chronic pain syndrome: Secondary | ICD-10-CM

## 2013-03-27 DIAGNOSIS — R109 Unspecified abdominal pain: Secondary | ICD-10-CM

## 2013-03-27 DIAGNOSIS — Z0000 Encounter for general adult medical examination without abnormal findings: Secondary | ICD-10-CM

## 2013-03-27 DIAGNOSIS — M1990 Unspecified osteoarthritis, unspecified site: Secondary | ICD-10-CM

## 2013-03-27 DIAGNOSIS — R918 Other nonspecific abnormal finding of lung field: Secondary | ICD-10-CM

## 2013-03-27 DIAGNOSIS — J449 Chronic obstructive pulmonary disease, unspecified: Secondary | ICD-10-CM

## 2013-03-27 MED ORDER — LOSARTAN POTASSIUM-HCTZ 50-12.5 MG PO TABS: 1.0000 | ORAL_TABLET | Freq: Every day | ORAL | Status: DC

## 2013-03-27 MED ORDER — UMECLIDINIUM-VILANTEROL 62.5-25 MCG/INH IN AEPB: 1.0000 | INHALATION_SPRAY | Freq: Every day | RESPIRATORY_TRACT | Status: DC

## 2013-03-27 MED ORDER — HYDROCODONE-ACETAMINOPHEN 5-325 MG PO TABS: 1.0000 | ORAL_TABLET | Freq: Four times a day (QID) | ORAL | Status: DC | PRN

## 2013-03-27 NOTE — Progress Notes (Signed)
Pre visit review using our clinic review tool, if applicable. No additional management support is needed unless otherwise documented below in the visit note. 

## 2013-03-27 NOTE — Assessment & Plan Note (Signed)
Stable xray done one week ago

## 2013-03-27 NOTE — Assessment & Plan Note (Signed)
She will cont norco as needed for pain 

## 2013-03-27 NOTE — Assessment & Plan Note (Signed)
She has not been taking hyzaar and her BP is therefore not well controlled I have asked her to restart hyzaar

## 2013-03-27 NOTE — Patient Instructions (Signed)

## 2013-03-27 NOTE — Progress Notes (Signed)
Subjective:    Patient ID: Kelli Reyes, female    DOB: Aug 16, 1945, 68 y.o.   MRN: [id number]  Cough This is a recurrent problem. The current episode started 1 to 4 weeks ago. The problem has been gradually improving. The problem occurs every few hours. The cough is non-productive. Associated symptoms include wheezing. Pertinent negatives include no chest pain, chills, ear congestion, ear pain, fever, headaches, heartburn, hemoptysis, myalgias, nasal congestion, postnasal drip, rash, rhinorrhea, sore throat, shortness of breath, sweats or weight loss. The symptoms are aggravated by cold air. She has tried a beta-agonist inhaler and oral steroids for the symptoms. The treatment provided significant relief. Her past medical history is significant for COPD. There is no history of asthma, bronchiectasis, bronchitis, emphysema, environmental allergies or pneumonia.      Review of Systems  Constitutional: Negative.  Negative for fever, chills, weight loss, diaphoresis, appetite change and fatigue.  HENT: Negative.  Negative for ear pain, facial swelling, nosebleeds, postnasal drip, rhinorrhea, sinus pressure, sore throat, tinnitus, trouble swallowing and voice change.   Eyes: Negative.   Respiratory: Positive for cough and wheezing. Negative for apnea, hemoptysis, choking, chest tightness, shortness of breath and stridor.   Cardiovascular: Negative.  Negative for chest pain, palpitations and leg swelling.  Gastrointestinal: Negative.  Negative for heartburn and abdominal pain.  Endocrine: Negative.   Genitourinary: Negative.   Musculoskeletal: Positive for arthralgias and back pain. Negative for gait problem, joint swelling, myalgias, neck pain and neck stiffness.  Skin: Negative.  Negative for rash.  Allergic/Immunologic: Negative.  Negative for environmental allergies.  Neurological: Negative.  Negative for headaches.  Hematological: Negative.  Negative for adenopathy. Does not bruise/bleed  easily.  Psychiatric/Behavioral: Negative.        Objective:   Physical Exam  Vitals reviewed. Constitutional: She is oriented to person, place, and time. She appears well-developed and well-nourished.  Non-toxic appearance. She does not have a sickly appearance. She does not appear ill. No distress.  HENT:  Head: Normocephalic and atraumatic.  Mouth/Throat: Oropharynx is clear and moist. No oropharyngeal exudate.  Eyes: Conjunctivae are normal. Right eye exhibits no discharge. Left eye exhibits no discharge. No scleral icterus.  Neck: Normal range of motion. Neck supple. No JVD present. No tracheal deviation present. No thyromegaly present.  Cardiovascular: Normal rate, regular rhythm, normal heart sounds and intact distal pulses.  Exam reveals no gallop and no friction rub.   No murmur heard. Pulmonary/Chest: Effort normal. No accessory muscle usage or stridor. Not tachypneic. No respiratory distress. She has no decreased breath sounds. She has wheezes in the right upper field and the left upper field. She has no rhonchi. She has no rales. She exhibits no tenderness.  Abdominal: Soft. Bowel sounds are normal. She exhibits no distension and no mass. There is no tenderness. There is no rebound and no guarding.  Musculoskeletal: Normal range of motion. She exhibits no edema and no tenderness.  Lymphadenopathy:    She has no cervical adenopathy.  Neurological: She is oriented to person, place, and time.  Skin: Skin is warm and dry. No rash noted. She is not diaphoretic. No erythema. No pallor.  Psychiatric: She has a normal mood and affect. Her behavior is normal. Judgment and thought content normal.     Lab Results  Component Value Date   WBC 7.4 03/20/2013   HGB 14.4 03/20/2013   HCT 43.1 03/20/2013   PLT 193 03/20/2013   GLUCOSE 91 03/20/2013   CHOL 167 07/15/2012   TRIG  303.0* 07/15/2012   HDL 19.80* 07/15/2012   LDLDIRECT 65.3 07/15/2012   LDLCALC 123* 09/03/2011   ALT 26  12/01/2012   AST 28 12/01/2012   NA 137 03/20/2013   K 3.9 03/20/2013   CL 99 03/20/2013   CREATININE 1.02 03/20/2013   BUN 18 03/20/2013   CO2 24 03/20/2013   TSH 0.96 04/30/2011   INR 1.1* 08/04/2012   HGBA1C 5.7 12/01/2012       Assessment & Plan:

## 2013-03-27 NOTE — Assessment & Plan Note (Signed)
I think anoro is a better choice for her so I gave her samples and I showed her how to use, she demonstrated proficiency with its use She will use the albuterol as needed

## 2013-04-23 ENCOUNTER — Telehealth: Payer: Self-pay | Admitting: Internal Medicine

## 2013-04-23 NOTE — Telephone Encounter (Signed)
Relevant patient education assigned to patient using Emmi. ° °

## 2013-04-24 ENCOUNTER — Ambulatory Visit: Payer: BC Managed Care – PPO | Admitting: Internal Medicine

## 2013-04-24 DIAGNOSIS — Z0289 Encounter for other administrative examinations: Secondary | ICD-10-CM

## 2013-04-30 ENCOUNTER — Telehealth: Payer: Self-pay | Admitting: Hematology and Oncology

## 2013-04-30 NOTE — Telephone Encounter (Signed)
pt dtr called for appt. r/s dec appts and gv dtr appt for 2/26 @ 10:30am lb/NG. date per dtr.

## 2013-05-07 ENCOUNTER — Ambulatory Visit (INDEPENDENT_AMBULATORY_CARE_PROVIDER_SITE_OTHER): Payer: BC Managed Care – PPO | Admitting: Internal Medicine

## 2013-05-07 ENCOUNTER — Encounter: Payer: Self-pay | Admitting: Internal Medicine

## 2013-05-07 VITALS — BP 110/70 | HR 113 | Temp 99.3°F | Resp 16 | Ht 62.0 in | Wt 152.0 lb

## 2013-05-07 DIAGNOSIS — R109 Unspecified abdominal pain: Secondary | ICD-10-CM

## 2013-05-07 DIAGNOSIS — G894 Chronic pain syndrome: Secondary | ICD-10-CM

## 2013-05-07 DIAGNOSIS — J449 Chronic obstructive pulmonary disease, unspecified: Secondary | ICD-10-CM

## 2013-05-07 DIAGNOSIS — J441 Chronic obstructive pulmonary disease with (acute) exacerbation: Secondary | ICD-10-CM | POA: Insufficient documentation

## 2013-05-07 DIAGNOSIS — E7800 Pure hypercholesterolemia, unspecified: Secondary | ICD-10-CM

## 2013-05-07 DIAGNOSIS — M1990 Unspecified osteoarthritis, unspecified site: Secondary | ICD-10-CM

## 2013-05-07 MED ORDER — METHYLPREDNISOLONE ACETATE 80 MG/ML IJ SUSP: 120.0000 mg | Freq: Once | INTRAMUSCULAR | Status: DC

## 2013-05-07 MED ORDER — ALBUTEROL SULFATE HFA 108 (90 BASE) MCG/ACT IN AERS: 2.0000 | INHALATION_SPRAY | Freq: Four times a day (QID) | RESPIRATORY_TRACT | Status: DC | PRN

## 2013-05-07 MED ORDER — ALBUTEROL SULFATE (2.5 MG/3ML) 0.083% IN NEBU: 2.5000 mg | INHALATION_SOLUTION | Freq: Once | RESPIRATORY_TRACT | Status: DC

## 2013-05-07 MED ORDER — HYDROCODONE-ACETAMINOPHEN 5-325 MG PO TABS: 1.0000 | ORAL_TABLET | Freq: Four times a day (QID) | ORAL | Status: DC | PRN

## 2013-05-07 MED ORDER — PROMETHAZINE-DM 6.25-15 MG/5ML PO SYRP: 5.0000 mL | ORAL_SOLUTION | Freq: Four times a day (QID) | ORAL | Status: DC | PRN

## 2013-05-07 NOTE — Progress Notes (Signed)
Subjective:    Patient ID: Kelli Reyes, female    DOB: 1945-12-28, 68 y.o.   MRN: [id number]  Cough This is a recurrent problem. The current episode started 1 to 4 weeks ago. The problem has been gradually worsening. The problem occurs every few hours. The cough is non-productive. Associated symptoms include chills, shortness of breath and wheezing. Pertinent negatives include no chest pain, ear congestion, ear pain, fever, headaches, heartburn, hemoptysis, myalgias, nasal congestion, postnasal drip, rash, rhinorrhea, sore throat, sweats or weight loss. The symptoms are aggravated by cold air. She has tried ipratropium inhaler, a beta-agonist inhaler and OTC cough suppressant for the symptoms. The treatment provided mild relief. Her past medical history is significant for COPD. There is no history of asthma, bronchiectasis, bronchitis, emphysema, environmental allergies or pneumonia.      Review of Systems  Constitutional: Positive for chills. Negative for fever, weight loss, diaphoresis, activity change, appetite change, fatigue and unexpected weight change.  HENT: Negative.  Negative for ear pain, postnasal drip, rhinorrhea, sinus pressure, sore throat, trouble swallowing and voice change.   Eyes: Negative.   Respiratory: Positive for cough, shortness of breath and wheezing. Negative for apnea, hemoptysis, choking, chest tightness and stridor.   Cardiovascular: Negative.  Negative for chest pain, palpitations and leg swelling.  Gastrointestinal: Negative.  Negative for heartburn, nausea, vomiting, abdominal pain, diarrhea, constipation and blood in stool.  Endocrine: Negative.   Genitourinary: Negative.   Musculoskeletal: Negative.  Negative for myalgias.  Skin: Negative.  Negative for rash.  Allergic/Immunologic: Negative.  Negative for environmental allergies.  Neurological: Negative.  Negative for headaches.  Hematological: Negative.  Negative for adenopathy. Does not bruise/bleed  easily.  Psychiatric/Behavioral: Negative.        Objective:   Physical Exam  Vitals reviewed. Constitutional: She is oriented to person, place, and time. She appears well-developed and well-nourished.  Non-toxic appearance. She does not have a sickly appearance. She does not appear ill. No distress.  HENT:  Head: Normocephalic and atraumatic.  Mouth/Throat: Oropharynx is clear and moist. No oropharyngeal exudate.  Eyes: Conjunctivae are normal. Right eye exhibits no discharge. Left eye exhibits no discharge. No scleral icterus.  Neck: Normal range of motion. Neck supple. No JVD present. No tracheal deviation present. No thyromegaly present.  Cardiovascular: Normal rate, regular rhythm, normal heart sounds and intact distal pulses.  Exam reveals no gallop and no friction rub.   No murmur heard. Pulmonary/Chest: Accessory muscle usage present. Tachypnea noted. No respiratory distress. She has no decreased breath sounds. She has wheezes in the right middle field and the left middle field. She has rhonchi in the right middle field and the left middle field. She has no rales. She exhibits no tenderness.  She was given a jet neb with albuterol 2.5 mg and after that her lung exam shows a 50% reduction in wheezing and good air movement.  Abdominal: Soft. Bowel sounds are normal. She exhibits no distension and no mass. There is no tenderness. There is no rebound and no guarding.  Musculoskeletal: Normal range of motion. She exhibits no edema and no tenderness.  Lymphadenopathy:    She has no cervical adenopathy.  Neurological: She is oriented to person, place, and time.  Skin: Skin is warm and dry. No rash noted. She is not diaphoretic. No erythema. No pallor.  Psychiatric: She has a normal mood and affect. Her behavior is normal. Judgment and thought content normal.     Lab Results  Component Value Date  WBC 7.4 03/20/2013   HGB 14.4 03/20/2013   HCT 43.1 03/20/2013   PLT 193 03/20/2013    GLUCOSE 91 03/20/2013   CHOL 167 07/15/2012   TRIG 303.0* 07/15/2012   HDL 19.80* 07/15/2012   LDLDIRECT 65.3 07/15/2012   LDLCALC 123* 09/03/2011   ALT 26 12/01/2012   AST 28 12/01/2012   NA 137 03/20/2013   K 3.9 03/20/2013   CL 99 03/20/2013   CREATININE 1.02 03/20/2013   BUN 18 03/20/2013   CO2 24 03/20/2013   TSH 0.96 04/30/2011   INR 1.1* 08/04/2012   HGBA1C 5.7 12/01/2012       Assessment & Plan:

## 2013-05-07 NOTE — Patient Instructions (Signed)
Chronic Obstructive Pulmonary Disease  Chronic obstructive pulmonary disease (COPD) is a common lung condition in which airflow from the lungs is limited. COPD is a general term that can be used to describe many different lung problems that limit airflow, including both chronic bronchitis and emphysema.  If you have COPD, your lung function will probably never return to normal, but there are measures you can take to improve lung function and make yourself feel better.   CAUSES   · Smoking (common).    · Exposure to secondhand smoke.    · Genetic problems.  · Chronic inflammatory lung diseases or recurrent infections.  SYMPTOMS   · Shortness of breath, especially with physical activity.    · Deep, persistent (chronic) cough with a large amount of thick mucus.    · Wheezing.    · Rapid breaths (tachypnea).    · Gray or bluish discoloration (cyanosis) of the skin, especially in fingers, toes, or lips.    · Fatigue.    · Weight loss.    · Frequent infections or episodes when breathing symptoms become much worse (exacerbations).    · Chest tightness.  DIAGNOSIS   Your healthcare provider will take a medical history and perform a physical examination to make the initial diagnosis.  Additional tests for COPD may include:   · Lung (pulmonary) function tests.  · Chest X-ray.  · CT scan.  · Blood tests.  TREATMENT   Treatment available to help you feel better when you have COPD include:   · Inhaler and nebulizer medicines. These help manage the symptoms of COPD and make your breathing more comfortable  · Supplemental oxygen. Supplemental oxygen is only helpful if you have a low oxygen level in your blood.    · Exercise and physical activity. These are beneficial for nearly all people with COPD. Some people may also benefit from a pulmonary rehabilitation program.  HOME CARE INSTRUCTIONS   · Take all medicines (inhaled or pills) as directed by your health care provider.  · Only take over-the-counter or prescription medicines  for pain, fever, or discomfort as directed by your health care provider.    · Avoid over-the-counter medicines or cough syrups that dry up your airway (such as antihistamines) and slow down the elimination of secretions unless instructed otherwise by your healthcare provider.    · If you are a smoker, the most important thing that you can do is stop smoking. Continuing to smoke will cause further lung damage and breathing trouble. Ask your health care provider for help with quitting smoking. He or she can direct you to community resources or hospitals that provide support.  · Avoid exposure to irritants such as smoke, chemicals, and fumes that aggravate your breathing.  · Use oxygen therapy and pulmonary rehabilitation if directed by your health care provider. If you require home oxygen therapy, ask your healthcare provider whether you should purchase a pulse oximeter to measure your oxygen level at home.    · Avoid contact with individuals who have a contagious illness.  · Avoid extreme temperature and humidity changes.  · Eat healthy foods. Eating smaller, more frequent meals and resting before meals may help you maintain your strength.  · Stay active, but balance activity with periods of rest. Exercise and physical activity will help you maintain your ability to do things you want to do.  · Preventing infection and hospitalization is very important when you have COPD. Make sure to receive all the vaccines your health care provider recommends, especially the pneumococcal and influenza vaccines. Ask your healthcare provider whether you   need a pneumonia vaccine.  · Learn and use relaxation techniques to manage stress.  · Learn and use controlled breathing techniques as directed by your health care provider. Controlled breathing techniques include:    · Pursed lip breathing. Start by breathing in (inhaling) through your nose for 1 second. Then, purse your lips as if you were going to whistle and breathe out (exhale)  through the pursed lips for 2 seconds.    · Diaphragmatic breathing. Start by putting one hand on your abdomen just above your waist. Inhale slowly through your nose. The hand on your abdomen should move out. Then purse your lips and exhale slowly. You should be able to feel the hand on your abdomen moving in as you exhale.    · Learn and use controlled coughing to clear mucus from your lungs. Controlled coughing is a series of short, progressive coughs. The steps of controlled coughing are:    1. Lean your head slightly forward.    2. Breathe in deeply using diaphragmatic breathing.    3. Try to hold your breath for 3 seconds.    4. Keep your mouth slightly open while coughing twice.    5. Spit any mucus out into a tissue.    6. Rest and repeat the steps once or twice as needed.  SEEK MEDICAL CARE IF:   · You are coughing up more mucus than usual.    · There is a change in the color or thickness of your mucus.    · Your breathing is more labored than usual.    · Your breathing is faster than usual.    SEEK IMMEDIATE MEDICAL CARE IF:   · You have shortness of breath while you are resting.    · You have shortness of breath that prevents you from:  · Being able to talk.    · Performing your usual physical activities.    · You have chest pain lasting longer than 5 minutes.    · Your skin color is more cyanotic than usual.  · You measure low oxygen saturations for longer than 5 minutes with a pulse oximeter.  MAKE SURE YOU:   · Understand these instructions.  · Will watch your condition.  · Will get help right away if you are not doing well or get worse.  Document Released: 12/20/2004 Document Revised: 12/31/2012 Document Reviewed: 11/06/2012  [employer]® Patient Information ©2014 [employer].

## 2013-05-07 NOTE — Progress Notes (Signed)
Pre visit review using our clinic review tool, if applicable. No additional management support is needed unless otherwise documented below in the visit note. 

## 2013-05-08 ENCOUNTER — Ambulatory Visit: Payer: BC Managed Care – PPO | Admitting: Internal Medicine

## 2013-05-10 NOTE — Assessment & Plan Note (Signed)
She will has responded well to albuterol so I have asked her to cont that with an inhaler She was given an injection of depo-medrol IM She will cont using Anoro She will take phenergan-dm for the cough

## 2013-05-10 NOTE — Assessment & Plan Note (Signed)
She will cont the current meds for pain 

## 2013-05-20 ENCOUNTER — Telehealth: Payer: Self-pay | Admitting: *Deleted

## 2013-05-20 NOTE — Telephone Encounter (Signed)
Left VM for pt to return nurse's call regarding appt tomorrow, asked if she needs [place] to r/s due to inclement weather expected tomorrow.

## 2013-05-21 ENCOUNTER — Other Ambulatory Visit: Payer: Self-pay | Admitting: Hematology and Oncology

## 2013-05-21 ENCOUNTER — Ambulatory Visit: Payer: BC Managed Care – PPO | Admitting: Hematology and Oncology

## 2013-05-21 ENCOUNTER — Other Ambulatory Visit: Payer: BC Managed Care – PPO

## 2013-05-21 DIAGNOSIS — D472 Monoclonal gammopathy: Secondary | ICD-10-CM

## 2013-05-22 ENCOUNTER — Encounter: Payer: Self-pay | Admitting: *Deleted

## 2013-05-27 ENCOUNTER — Other Ambulatory Visit: Payer: Self-pay | Admitting: Internal Medicine

## 2013-05-28 ENCOUNTER — Ambulatory Visit: Payer: BC Managed Care – PPO | Admitting: Internal Medicine

## 2013-05-28 ENCOUNTER — Other Ambulatory Visit: Payer: Self-pay | Admitting: Internal Medicine

## 2013-05-28 DIAGNOSIS — Z0289 Encounter for other administrative examinations: Secondary | ICD-10-CM

## 2013-06-29 ENCOUNTER — Ambulatory Visit (INDEPENDENT_AMBULATORY_CARE_PROVIDER_SITE_OTHER): Payer: BC Managed Care – PPO | Admitting: Internal Medicine

## 2013-06-29 ENCOUNTER — Encounter: Payer: Self-pay | Admitting: Internal Medicine

## 2013-06-29 ENCOUNTER — Other Ambulatory Visit (INDEPENDENT_AMBULATORY_CARE_PROVIDER_SITE_OTHER): Payer: BC Managed Care – PPO

## 2013-06-29 VITALS — BP 128/80 | HR 80 | Temp 98.8°F | Resp 16 | Ht 62.0 in | Wt 154.0 lb

## 2013-06-29 DIAGNOSIS — J449 Chronic obstructive pulmonary disease, unspecified: Secondary | ICD-10-CM

## 2013-06-29 DIAGNOSIS — Z1231 Encounter for screening mammogram for malignant neoplasm of breast: Secondary | ICD-10-CM

## 2013-06-29 DIAGNOSIS — E7800 Pure hypercholesterolemia, unspecified: Secondary | ICD-10-CM

## 2013-06-29 DIAGNOSIS — E782 Mixed hyperlipidemia: Secondary | ICD-10-CM

## 2013-06-29 DIAGNOSIS — I10 Essential (primary) hypertension: Secondary | ICD-10-CM

## 2013-06-29 DIAGNOSIS — R109 Unspecified abdominal pain: Secondary | ICD-10-CM

## 2013-06-29 DIAGNOSIS — G894 Chronic pain syndrome: Secondary | ICD-10-CM

## 2013-06-29 DIAGNOSIS — M1990 Unspecified osteoarthritis, unspecified site: Secondary | ICD-10-CM

## 2013-06-29 LAB — URINALYSIS, ROUTINE W REFLEX MICROSCOPIC
BILIRUBIN URINE: NEGATIVE
HGB URINE DIPSTICK: NEGATIVE
KETONES UR: NEGATIVE
LEUKOCYTES UA: NEGATIVE
Nitrite: NEGATIVE
Specific Gravity, Urine: 1.025 (ref 1.000–1.030)
Total Protein, Urine: NEGATIVE
URINE GLUCOSE: NEGATIVE
UROBILINOGEN UA: 0.2 (ref 0.0–1.0)
pH: 6 (ref 5.0–8.0)

## 2013-06-29 LAB — TSH: TSH: 1.04 u[IU]/mL (ref 0.35–5.50)

## 2013-06-29 LAB — COMPREHENSIVE METABOLIC PANEL
ALBUMIN: 3.6 g/dL (ref 3.5–5.2)
ALT: 18 U/L (ref 0–35)
AST: 19 U/L (ref 0–37)
Alkaline Phosphatase: 110 U/L (ref 39–117)
BUN: 16 mg/dL (ref 6–23)
CALCIUM: 9.2 mg/dL (ref 8.4–10.5)
CHLORIDE: 105 meq/L (ref 96–112)
CO2: 25 meq/L (ref 19–32)
Creatinine, Ser: 1 mg/dL (ref 0.4–1.2)
GFR: 68.83 mL/min (ref >60.00)
Glucose, Bld: 86 mg/dL (ref 70–99)
POTASSIUM: 3.6 meq/L (ref 3.5–5.1)
Sodium: 140 mEq/L (ref 135–145)
TOTAL PROTEIN: 8 g/dL (ref 6.0–8.3)
Total Bilirubin: 0.4 mg/dL (ref 0.3–1.2)

## 2013-06-29 LAB — LIPID PANEL
CHOL/HDL RATIO: 5
Cholesterol: 234 mg/dL — ABNORMAL HIGH (ref 0–200)
HDL: 42.9 mg/dL (ref >39.00)
LDL Cholesterol: 163 mg/dL — ABNORMAL HIGH (ref 0–99)
Triglycerides: 139 mg/dL (ref 0.0–149.0)
VLDL: 27.8 mg/dL (ref 0.0–40.0)

## 2013-06-29 MED ORDER — HYDROCODONE-ACETAMINOPHEN 5-325 MG PO TABS: 1.0000 | ORAL_TABLET | Freq: Four times a day (QID) | ORAL | Status: DC | PRN

## 2013-06-29 NOTE — Progress Notes (Signed)
   Subjective:    Patient ID: Kelli Reyes, female    DOB: April 27, 1945, 68 y.o.   MRN: [id number]  Hypertension This is a chronic problem. The current episode started more than 1 year ago. The problem is unchanged. The problem is controlled. Pertinent negatives include no anxiety, blurred vision, chest pain, headaches, malaise/fatigue, neck pain, orthopnea, palpitations, peripheral edema, PND, shortness of breath or sweats. There are no associated agents to hypertension. There are no known risk factors for coronary artery disease. Past treatments include angiotensin blockers and diuretics. Hypertensive end-organ damage includes a thyroid problem.      Review of Systems  Constitutional: Negative.  Negative for fever, chills, malaise/fatigue, diaphoresis, appetite change and fatigue.  HENT: Negative.   Eyes: Negative.  Negative for blurred vision.  Respiratory: Negative.  Negative for cough, choking, chest tightness, shortness of breath and stridor.   Cardiovascular: Negative.  Negative for chest pain, palpitations, orthopnea, leg swelling and PND.  Gastrointestinal: Negative.  Negative for nausea, vomiting, abdominal pain, diarrhea, constipation and blood in stool.  Endocrine: Negative.   Genitourinary: Negative.   Musculoskeletal: Positive for arthralgias and back pain. Negative for gait problem, joint swelling, myalgias, neck pain and neck stiffness.  Skin: Negative.   Neurological: Negative.  Negative for headaches.  Hematological: Negative.  Negative for adenopathy. Does not bruise/bleed easily.  Psychiatric/Behavioral: Negative.        Objective:   Physical Exam  Vitals reviewed. Constitutional: She is oriented to person, place, and time. She appears well-developed and well-nourished. No distress.  HENT:  Head: Normocephalic and atraumatic.  Mouth/Throat: Oropharynx is clear and moist. No oropharyngeal exudate.  Eyes: Conjunctivae are normal. Right eye exhibits no discharge. Left  eye exhibits no discharge. No scleral icterus.  Neck: Normal range of motion. Neck supple. No JVD present. No tracheal deviation present. No thyromegaly present.  Cardiovascular: Normal rate, regular rhythm, normal heart sounds and intact distal pulses.  Exam reveals no gallop and no friction rub.   No murmur heard. Pulmonary/Chest: Effort normal and breath sounds normal. No stridor. No respiratory distress. She has no wheezes. She has no rales. She exhibits no tenderness.  Abdominal: Soft. Bowel sounds are normal. She exhibits no distension and no mass. There is no tenderness. There is no rebound and no guarding.  Musculoskeletal: Normal range of motion. She exhibits no edema and no tenderness.  Lymphadenopathy:    She has no cervical adenopathy.  Neurological: She is oriented to person, place, and time.  Skin: Skin is warm and dry. No rash noted. She is not diaphoretic. No erythema. No pallor.  Psychiatric: She has a normal mood and affect. Her behavior is normal. Judgment and thought content normal.     Lab Results  Component Value Date   WBC 7.4 03/20/2013   HGB 14.4 03/20/2013   HCT 43.1 03/20/2013   PLT 193 03/20/2013   GLUCOSE 91 03/20/2013   CHOL 167 07/15/2012   TRIG 303.0* 07/15/2012   HDL 19.80* 07/15/2012   LDLDIRECT 65.3 07/15/2012   LDLCALC 123* 09/03/2011   ALT 26 12/01/2012   AST 28 12/01/2012   NA 137 03/20/2013   K 3.9 03/20/2013   CL 99 03/20/2013   CREATININE 1.02 03/20/2013   BUN 18 03/20/2013   CO2 24 03/20/2013   TSH 0.96 04/30/2011   INR 1.1* 08/04/2012   HGBA1C 5.7 12/01/2012       Assessment & Plan:

## 2013-06-29 NOTE — Assessment & Plan Note (Signed)
She will cont norco as needed for pain Today I will check her UDS to see that she is compliant and to screen for substance abuse

## 2013-06-29 NOTE — Assessment & Plan Note (Signed)
BP is well controlled I will monitor her lytes and renal function

## 2013-06-29 NOTE — Assessment & Plan Note (Signed)
She is doing well on anoro

## 2013-06-29 NOTE — Patient Instructions (Signed)

## 2013-06-29 NOTE — Assessment & Plan Note (Signed)
I will check her lipid panel today 

## 2013-06-30 ENCOUNTER — Encounter: Payer: Self-pay | Admitting: Internal Medicine

## 2013-06-30 LAB — DRUGS OF ABUSE SCREEN W/O ALC, ROUTINE URINE
AMPHETAMINE SCRN UR: NEGATIVE
BARBITURATE QUANT UR: NEGATIVE
Benzodiazepines.: NEGATIVE
COCAINE METABOLITES: NEGATIVE
CREATININE, U: 257.8 mg/dL
Marijuana Metabolite: NEGATIVE
Methadone: NEGATIVE
OPIATE SCREEN, URINE: NEGATIVE
Phencyclidine (PCP): NEGATIVE
Propoxyphene: NEGATIVE

## 2013-09-22 ENCOUNTER — Telehealth: Payer: Self-pay | Admitting: *Deleted

## 2013-09-22 NOTE — Telephone Encounter (Signed)
Left msg on triage requesting sample on her albuterol inhaler. Tried to call pt back vm is full, no samples available...Kelli Reyes

## 2013-09-22 NOTE — Telephone Encounter (Signed)
Called pt on her cell phone inform her we didn't have the albuterol inhaler. Pt stated she needed the sample that was given at last ov Anoro. Left anoro sample for pick-up...Johny Chess

## 2013-09-28 ENCOUNTER — Encounter: Payer: Self-pay | Admitting: Internal Medicine

## 2013-09-28 ENCOUNTER — Ambulatory Visit (INDEPENDENT_AMBULATORY_CARE_PROVIDER_SITE_OTHER): Payer: Medicare Other | Admitting: Internal Medicine

## 2013-09-28 ENCOUNTER — Other Ambulatory Visit (INDEPENDENT_AMBULATORY_CARE_PROVIDER_SITE_OTHER): Payer: BC Managed Care – PPO

## 2013-09-28 VITALS — BP 118/76 | HR 80 | Temp 98.1°F | Resp 16 | Ht 62.0 in | Wt 148.0 lb

## 2013-09-28 DIAGNOSIS — Z0000 Encounter for general adult medical examination without abnormal findings: Secondary | ICD-10-CM

## 2013-09-28 DIAGNOSIS — E876 Hypokalemia: Secondary | ICD-10-CM

## 2013-09-28 DIAGNOSIS — Z1231 Encounter for screening mammogram for malignant neoplasm of breast: Secondary | ICD-10-CM

## 2013-09-28 DIAGNOSIS — I10 Essential (primary) hypertension: Secondary | ICD-10-CM

## 2013-09-28 DIAGNOSIS — J449 Chronic obstructive pulmonary disease, unspecified: Secondary | ICD-10-CM

## 2013-09-28 DIAGNOSIS — D472 Monoclonal gammopathy: Secondary | ICD-10-CM

## 2013-09-28 DIAGNOSIS — R109 Unspecified abdominal pain: Secondary | ICD-10-CM

## 2013-09-28 DIAGNOSIS — E785 Hyperlipidemia, unspecified: Secondary | ICD-10-CM

## 2013-09-28 LAB — CBC WITH DIFFERENTIAL/PLATELET
Basophils Absolute: 0 10*3/uL (ref 0.0–0.1)
Basophils Relative: 0.3 % (ref 0.0–3.0)
Eosinophils Absolute: 0.2 10*3/uL (ref 0.0–0.7)
Eosinophils Relative: 2.8 % (ref 0.0–5.0)
HCT: 40.1 % (ref 36.0–46.0)
Hemoglobin: 13.4 g/dL (ref 12.0–15.0)
LYMPHS PCT: 17.6 % (ref 12.0–46.0)
Lymphs Abs: 1.5 10*3/uL (ref 0.7–4.0)
MCHC: 33.5 g/dL (ref 30.0–36.0)
MCV: 88 fl (ref 78.0–100.0)
Monocytes Absolute: 1 10*3/uL (ref 0.1–1.0)
Monocytes Relative: 12.4 % — ABNORMAL HIGH (ref 3.0–12.0)
NEUTROS PCT: 66.9 % (ref 43.0–77.0)
Neutro Abs: 5.6 10*3/uL (ref 1.4–7.7)
Platelets: 303 10*3/uL (ref 150.0–400.0)
RBC: 4.55 Mil/uL (ref 3.87–5.11)
RDW: 13.8 % (ref 11.5–15.5)
WBC: 8.4 10*3/uL (ref 4.0–10.5)

## 2013-09-28 LAB — COMPREHENSIVE METABOLIC PANEL
ALT: 16 U/L (ref 0–35)
AST: 23 U/L (ref 0–37)
Albumin: 3.4 g/dL — ABNORMAL LOW (ref 3.5–5.2)
Alkaline Phosphatase: 128 U/L — ABNORMAL HIGH (ref 39–117)
BUN: 13 mg/dL (ref 6–23)
CALCIUM: 10.4 mg/dL (ref 8.4–10.5)
CHLORIDE: 105 meq/L (ref 96–112)
CO2: 25 mEq/L (ref 19–32)
Creatinine, Ser: 0.9 mg/dL (ref 0.4–1.2)
GFR: 78.36 mL/min (ref >60.00)
Glucose, Bld: 116 mg/dL — ABNORMAL HIGH (ref 70–99)
Potassium: 3.6 mEq/L (ref 3.5–5.1)
Sodium: 138 mEq/L (ref 135–145)
Total Bilirubin: 0.4 mg/dL (ref 0.2–1.2)
Total Protein: 8.1 g/dL (ref 6.0–8.3)

## 2013-09-28 MED ORDER — ATORVASTATIN CALCIUM 40 MG PO TABS
40.0000 mg | ORAL_TABLET | Freq: Every day | ORAL | Status: DC
Start: 2013-09-28 — End: 2014-01-21

## 2013-09-28 MED ORDER — UMECLIDINIUM-VILANTEROL 62.5-25 MCG/INH IN AEPB: 1.0000 | INHALATION_SPRAY | Freq: Every day | RESPIRATORY_TRACT | Status: DC

## 2013-09-28 MED ORDER — POTASSIUM CHLORIDE ER 8 MEQ PO TBCR: 8.0000 meq | EXTENDED_RELEASE_TABLET | Freq: Two times a day (BID) | ORAL | Status: DC

## 2013-09-28 NOTE — Progress Notes (Signed)
Pre visit review using our clinic review tool, if applicable. No additional management support is needed unless otherwise documented below in the visit note. 

## 2013-09-28 NOTE — Patient Instructions (Addendum)
Preventive Care for Adults A healthy lifestyle and preventive care can promote health and wellness. Preventive health guidelines for women include the following key practices.  A routine yearly physical is a good way to check with your health care provider about your health and preventive screening. It is a chance to share any concerns and updates on your health and to receive a thorough exam.  Visit your dentist for a routine exam and preventive care every 6 months. Brush your teeth twice a day and floss once a day. Good oral hygiene prevents tooth decay and gum disease.  The frequency of eye exams is based on your age, health, family medical history, use of contact lenses, and other factors. Follow your health care provider's recommendations for frequency of eye exams.  Eat a healthy diet. Foods like vegetables, fruits, whole grains, low-fat dairy products, and lean protein foods contain the nutrients you need without too many calories. Decrease your intake of foods high in solid fats, added sugars, and salt. Eat the right amount of calories for you.Get information about a proper diet from your health care provider, if necessary.  Regular physical exercise is one of the most important things you can do for your health. Most adults should get at least 150 minutes of moderate-intensity exercise (any activity that increases your heart rate and causes you to sweat) each week. In addition, most adults need muscle-strengthening exercises on 2 or more days a week.  Maintain a healthy weight. The body mass index (BMI) is a screening tool to identify possible weight problems. It provides an estimate of body fat based on height and weight. Your health care provider can find your BMI, and can help you achieve or maintain a healthy weight.For adults 20 years and older:  A BMI below 18.5 is considered underweight.  A BMI of 18.5 to 24.9 is normal.  A BMI of 25 to 29.9 is considered overweight.  A BMI of  30 and above is considered obese.  Maintain normal blood lipids and cholesterol levels by exercising and minimizing your intake of saturated fat. Eat a balanced diet with plenty of fruit and vegetables. Blood tests for lipids and cholesterol should begin at age 52 and be repeated every 5 years. If your lipid or cholesterol levels are high, you are over 50, or you are at high risk for heart disease, you may need your cholesterol levels checked more frequently.Ongoing high lipid and cholesterol levels should be treated with medicines if diet and exercise are not working.  If you smoke, find out from your health care provider how to quit. If you do not use tobacco, do not start.  Lung cancer screening is recommended for adults aged 37-80 years who are at high risk for developing lung cancer because of a history of smoking. A yearly low-dose CT scan of the lungs is recommended for people who have at least a 30-pack-year history of smoking and are a current smoker or have quit within the past 15 years. A pack year of smoking is smoking an average of 1 pack of cigarettes a day for 1 year (for example: 1 pack a day for 30 years or 2 packs a day for 15 years). Yearly screening should continue until the smoker has stopped smoking for at least 15 years. Yearly screening should be stopped for people who develop a health problem that would prevent them from having lung cancer treatment.  If you are pregnant, do not drink alcohol. If you are breastfeeding,  be very cautious about drinking alcohol. If you are not pregnant and choose to drink alcohol, do not have more than 1 drink per day. One drink is considered to be 12 ounces (355 mL) of beer, 5 ounces (148 mL) of wine, or 1.5 ounces (44 mL) of liquor.  Avoid use of street drugs. Do not share needles with anyone. Ask for help if you need support or instructions about stopping the use of drugs.  High blood pressure causes heart disease and increases the risk of  stroke. Your blood pressure should be checked at least every 1 to 2 years. Ongoing high blood pressure should be treated with medicines if weight loss and exercise do not work.  If you are 3-86 years old, ask your health care provider if you should take aspirin to prevent strokes.  Diabetes screening involves taking a blood sample to check your fasting blood sugar level. This should be done once every 3 years, after age 67, if you are within normal weight and without risk factors for diabetes. Testing should be considered at a younger age or be carried out more frequently if you are overweight and have at least 1 risk factor for diabetes.  Breast cancer screening is essential preventive care for women. You should practice "breast self-awareness." This means understanding the normal appearance and feel of your breasts and may include breast self-examination. Any changes detected, no matter how small, should be reported to a health care provider. Women in their 8s and 30s should have a clinical breast exam (CBE) by a health care provider as part of a regular health exam every 1 to 3 years. After age 70, women should have a CBE every year. Starting at age 25, women should consider having a mammogram (breast X-ray test) every year. Women who have a family history of breast cancer should talk to their health care provider about genetic screening. Women at a high risk of breast cancer should talk to their health care providers about having an MRI and a mammogram every year.  Breast cancer gene (BRCA)-related cancer risk assessment is recommended for women who have family members with BRCA-related cancers. BRCA-related cancers include breast, ovarian, tubal, and peritoneal cancers. Having family members with these cancers may be associated with an increased risk for harmful changes (mutations) in the breast cancer genes BRCA1 and BRCA2. Results of the assessment will determine the need for genetic counseling and  BRCA1 and BRCA2 testing.  Routine pelvic exams to screen for cancer are no longer recommended for nonpregnant women who are considered low risk for cancer of the pelvic organs (ovaries, uterus, and vagina) and who do not have symptoms. Ask your health care provider if a screening pelvic exam is right for you.  If you have had past treatment for cervical cancer or a condition that could lead to cancer, you need Pap tests and screening for cancer for at least 20 years after your treatment. If Pap tests have been discontinued, your risk factors (such as having a new sexual partner) need to be reassessed to determine if screening should be resumed. Some women have medical problems that increase the chance of getting cervical cancer. In these cases, your health care provider may recommend more frequent screening and Pap tests.  The HPV test is an additional test that may be used for cervical cancer screening. The HPV test looks for the virus that can cause the cell changes on the cervix. The cells collected during the Pap test can be  tested for HPV. The HPV test could be used to screen women aged 47 years and older, and should be used in women of any age who have unclear Pap test results. After the age of 36, women should have HPV testing at the same frequency as a Pap test.  Colorectal cancer can be detected and often prevented. Most routine colorectal cancer screening begins at the age of 38 years and continues through age 58 years. However, your health care provider may recommend screening at an earlier age if you have risk factors for colon cancer. On a yearly basis, your health care provider may provide home test kits to check for hidden blood in the stool. Use of a small camera at the end of a tube, to directly examine the colon (sigmoidoscopy or colonoscopy), can detect the earliest forms of colorectal cancer. Talk to your health care provider about this at age 64, when routine screening begins. Direct  exam of the colon should be repeated every 5-10 years through age 21 years, unless early forms of pre-cancerous polyps or small growths are found.  People who are at an increased risk for hepatitis B should be screened for this virus. You are considered at high risk for hepatitis B if:  You were born in a country where hepatitis B occurs often. Talk with your health care provider about which countries are considered high risk.  Your parents were born in a high-risk country and you have not received a shot to protect against hepatitis B (hepatitis B vaccine).  You have HIV or AIDS.  You use needles to inject street drugs.  You live with, or have sex with, someone who has Hepatitis B.  You get hemodialysis treatment.  You take certain medicines for conditions like cancer, organ transplantation, and autoimmune conditions.  Hepatitis C blood testing is recommended for all people born from [birth year] through 1965 and any individual with known risks for hepatitis C.  Practice safe sex. Use condoms and avoid high-risk sexual practices to reduce the spread of sexually transmitted infections (STIs). STIs include gonorrhea, chlamydia, syphilis, trichomonas, herpes, HPV, and human immunodeficiency virus (HIV). Herpes, HIV, and HPV are viral illnesses that have no cure. They can result in disability, cancer, and death.  You should be screened for sexually transmitted illnesses (STIs) including gonorrhea and chlamydia if:  You are sexually active and are younger than 24 years.  You are older than 24 years and your health care provider tells you that you are at risk for this type of infection.  Your sexual activity has changed since you were last screened and you are at an increased risk for chlamydia or gonorrhea. Ask your health care provider if you are at risk.  If you are at risk of being infected with HIV, it is recommended that you take a prescription medicine daily to prevent HIV infection. This is  called preexposure prophylaxis (PrEP). You are considered at risk if:  You are a heterosexual woman, are sexually active, and are at increased risk for HIV infection.  You take drugs by injection.  You are sexually active with a partner who has HIV.  Talk with your health care provider about whether you are at high risk of being infected with HIV. If you choose to begin PrEP, you should first be tested for HIV. You should then be tested every 3 months for as long as you are taking PrEP.  Osteoporosis is a disease in which the bones lose minerals and strength  with aging. This can result in serious bone fractures or breaks. The risk of osteoporosis can be identified using a bone density scan. Women ages 65 years and over and women at risk for fractures or osteoporosis should discuss screening with their health care providers. Ask your health care provider whether you should take a calcium supplement or vitamin D to reduce the rate of osteoporosis.  Menopause can be associated with physical symptoms and risks. Hormone replacement therapy is available to decrease symptoms and risks. You should talk to your health care provider about whether hormone replacement therapy is right for you.  Use sunscreen. Apply sunscreen liberally and repeatedly throughout the day. You should seek shade when your shadow is shorter than you. Protect yourself by wearing long sleeves, pants, a wide-brimmed hat, and sunglasses year round, whenever you are outdoors.  Once a month, do a whole body skin exam, using a mirror to look at the skin on your back. Tell your health care provider of new moles, moles that have irregular borders, moles that are larger than a pencil eraser, or moles that have changed in shape or color.  Stay current with required vaccines (immunizations).  Influenza vaccine. All adults should be immunized every year.  Tetanus, diphtheria, and acellular pertussis (Td, Tdap) vaccine. Pregnant women should  receive 1 dose of Tdap vaccine during each pregnancy. The dose should be obtained regardless of the length of time since the last dose. Immunization is preferred during the 27th-36th week of gestation. An adult who has not previously received Tdap or who does not know her vaccine status should receive 1 dose of Tdap. This initial dose should be followed by tetanus and diphtheria toxoids (Td) booster doses every 10 years. Adults with an unknown or incomplete history of completing a 3-dose immunization series with Td-containing vaccines should begin or complete a primary immunization series including a Tdap dose. Adults should receive a Td booster every 10 years.  Varicella vaccine. An adult without evidence of immunity to varicella should receive 2 doses or a second dose if she has previously received 1 dose. Pregnant females who do not have evidence of immunity should receive the first dose after pregnancy. This first dose should be obtained before leaving the health care facility. The second dose should be obtained 4-8 weeks after the first dose.  Human papillomavirus (HPV) vaccine. Females aged 13-26 years who have not received the vaccine previously should obtain the 3-dose series. The vaccine is not recommended for use in pregnant females. However, pregnancy testing is not needed before receiving a dose. If a female is found to be pregnant after receiving a dose, no treatment is needed. In that case, the remaining doses should be delayed until after the pregnancy. Immunization is recommended for any person with an immunocompromised condition through the age of 26 years if she did not get any or all doses earlier. During the 3-dose series, the second dose should be obtained 4-8 weeks after the first dose. The third dose should be obtained 24 weeks after the first dose and 16 weeks after the second dose.  Zoster vaccine. One dose is recommended for adults aged 60 years or older unless certain conditions are  present.  Measles, mumps, and rubella (MMR) vaccine. Adults born before 1957 generally are considered immune to measles and mumps. Adults born in 1957 or later should have 1 or more doses of MMR vaccine unless there is a contraindication to the vaccine or there is laboratory evidence of immunity to   each of the three diseases. A routine second dose of MMR vaccine should be obtained at least 28 days after the first dose for students attending postsecondary schools, health care workers, or international travelers. People who received inactivated measles vaccine or an unknown type of measles vaccine during 1963-1967 should receive 2 doses of MMR vaccine. People who received inactivated mumps vaccine or an unknown type of mumps vaccine before 1979 and are at high risk for mumps infection should consider immunization with 2 doses of MMR vaccine. For females of childbearing age, rubella immunity should be determined. If there is no evidence of immunity, females who are not pregnant should be vaccinated. If there is no evidence of immunity, females who are pregnant should delay immunization until after pregnancy. Unvaccinated health care workers born before 1957 who lack laboratory evidence of measles, mumps, or rubella immunity or laboratory confirmation of disease should consider measles and mumps immunization with 2 doses of MMR vaccine or rubella immunization with 1 dose of MMR vaccine.  Pneumococcal 13-valent conjugate (PCV13) vaccine. When indicated, a person who is uncertain of her immunization history and has no record of immunization should receive the PCV13 vaccine. An adult aged 19 years or older who has certain medical conditions and has not been previously immunized should receive 1 dose of PCV13 vaccine. This PCV13 should be followed with a dose of pneumococcal polysaccharide (PPSV23) vaccine. The PPSV23 vaccine dose should be obtained at least 8 weeks after the dose of PCV13 vaccine. An adult aged 19  years or older who has certain medical conditions and previously received 1 or more doses of PPSV23 vaccine should receive 1 dose of PCV13. The PCV13 vaccine dose should be obtained 1 or more years after the last PPSV23 vaccine dose.  Pneumococcal polysaccharide (PPSV23) vaccine. When PCV13 is also indicated, PCV13 should be obtained first. All adults aged 65 years and older should be immunized. An adult younger than age 65 years who has certain medical conditions should be immunized. Any person who resides in a nursing home or long-term care facility should be immunized. An adult smoker should be immunized. People with an immunocompromised condition and certain other conditions should receive both PCV13 and PPSV23 vaccines. People with human immunodeficiency virus (HIV) infection should be immunized as soon as possible after diagnosis. Immunization during chemotherapy or radiation therapy should be avoided. Routine use of PPSV23 vaccine is not recommended for American Indians, [place] Natives, or people younger than 65 years unless there are medical conditions that require PPSV23 vaccine. When indicated, people who have unknown immunization and have no record of immunization should receive PPSV23 vaccine. One-time revaccination 5 years after the first dose of PPSV23 is recommended for people aged 19-64 years who have chronic kidney failure, nephrotic syndrome, asplenia, or immunocompromised conditions. People who received 1-2 doses of PPSV23 before age 65 years should receive another dose of PPSV23 vaccine at age 65 years or later if at least 5 years have passed since the previous dose. Doses of PPSV23 are not needed for people immunized with PPSV23 at or after age 65 years.  Meningococcal vaccine. Adults with asplenia or persistent complement component deficiencies should receive 2 doses of quadrivalent meningococcal conjugate (MenACWY-D) vaccine. The doses should be obtained at least 2 months apart.  Microbiologists working with certain meningococcal bacteria, military recruits, people at risk during an outbreak, and people who travel to or live in countries with a high rate of meningitis should be immunized. A first-year college student up through age   21 years who is living in a residence hall should receive a dose if she did not receive a dose on or after her 16th birthday. Adults who have certain high-risk conditions should receive one or more doses of vaccine.  Hepatitis A vaccine. Adults who wish to be protected from this disease, have certain high-risk conditions, work with hepatitis A-infected animals, work in hepatitis A research labs, or travel to or work in countries with a high rate of hepatitis A should be immunized. Adults who were previously unvaccinated and who anticipate close contact with an international adoptee during the first 60 days after arrival in the [place] from a country with a high rate of hepatitis A should be immunized.  Hepatitis B vaccine. Adults who wish to be protected from this disease, have certain high-risk conditions, may be exposed to blood or other infectious body fluids, are household contacts or sex partners of hepatitis B positive people, are clients or workers in certain care facilities, or travel to or work in countries with a high rate of hepatitis B should be immunized.  Haemophilus influenzae type b (Hib) vaccine. A previously unvaccinated person with asplenia or sickle cell disease or having a scheduled splenectomy should receive 1 dose of Hib vaccine. Regardless of previous immunization, a recipient of a hematopoietic stem cell transplant should receive a 3-dose series 6-12 months after her successful transplant. Hib vaccine is not recommended for adults with HIV infection. Preventive Services / Frequency Ages 43 to 39years  Blood pressure check.** / Every 1 to 2 years.  Lipid and cholesterol check.** / Every 5 years beginning at age  75.  Clinical breast exam.** / Every 3 years for women in their 32s and 74s.  BRCA-related cancer risk assessment.** / For women who have family members with a BRCA-related cancer (breast, ovarian, tubal, or peritoneal cancers).  Pap test.** / Every 2 years from ages 65 through 91. Every 3 years starting at age 34 through age 93 or 72 with a history of 3 consecutive normal Pap tests.  HPV screening.** / Every 3 years from ages 46 through ages 53 to 26 with a history of 3 consecutive normal Pap tests.  Hepatitis C blood test.** / For any individual with known risks for hepatitis C.  Skin self-exam. / Monthly.  Influenza vaccine. / Every year.  Tetanus, diphtheria, and acellular pertussis (Tdap, Td) vaccine.** / Consult your health care provider. Pregnant women should receive 1 dose of Tdap vaccine during each pregnancy. 1 dose of Td every 10 years.  Varicella vaccine.** / Consult your health care provider. Pregnant females who do not have evidence of immunity should receive the first dose after pregnancy.  HPV vaccine. / 3 doses over 6 months, if 70 and younger. The vaccine is not recommended for use in pregnant females. However, pregnancy testing is not needed before receiving a dose.  Measles, mumps, rubella (MMR) vaccine.** / You need at least 1 dose of MMR if you were born in 1957 or later. You may also need a 2nd dose. For females of childbearing age, rubella immunity should be determined. If there is no evidence of immunity, females who are not pregnant should be vaccinated. If there is no evidence of immunity, females who are pregnant should delay immunization until after pregnancy.  Pneumococcal 13-valent conjugate (PCV13) vaccine.** / Consult your health care provider.  Pneumococcal polysaccharide (PPSV23) vaccine.** / 1 to 2 doses if you smoke cigarettes or if you have certain conditions.  Meningococcal vaccine.** /  1 dose if you are age 70 to 51 years and a [place] living in a residence hall, or have one of several medical conditions, you need to get vaccinated against meningococcal disease. You may also need additional booster doses.  Hepatitis A vaccine.** / Consult your health care provider.  Hepatitis B vaccine.** / Consult your health care provider.  Haemophilus influenzae type b (Hib) vaccine.** / Consult your health care provider. Ages 40 to 64years  Blood pressure check.** / Every 1 to 2 years.  Lipid and cholesterol check.** / Every 5 years beginning at age 58 years.  Lung cancer screening. / Every year if you are aged 56-80 years and have a 30-pack-year history of smoking and currently smoke or have quit within the past 15 years. Yearly screening is stopped once you have quit smoking for at least 15 years or develop a health problem that would prevent you from having lung cancer treatment.  Clinical breast exam.** / Every year after age 35 years.  BRCA-related cancer risk assessment.** / For women who have family members with a BRCA-related cancer (breast, ovarian, tubal, or peritoneal cancers).  Mammogram.** / Every year beginning at age 109 years and continuing for as long as you are in good health. Consult with your health care provider.  Pap test.** / Every 3 years starting at age 44 years through age 94 or 70 years with a history of 3 consecutive normal Pap tests.  HPV screening.** / Every 3 years from ages 109 years through ages 50 to 30 years with a history of 3 consecutive normal Pap tests.  Fecal occult blood test (FOBT) of stool. / Every year beginning at age 73 years and continuing until age 59 years. You may not need to do this test if you get a colonoscopy every 10 years.  Flexible sigmoidoscopy or colonoscopy.** / Every 5 years for a flexible sigmoidoscopy or every 10 years for a colonoscopy beginning at age 68 years and continuing until age 12 years.  Hepatitis C blood test.** / For all people born from [birth year] through  1965 and any individual with known risks for hepatitis C.  Skin self-exam. / Monthly.  Influenza vaccine. / Every year.  Tetanus, diphtheria, and acellular pertussis (Tdap/Td) vaccine.** / Consult your health care provider. Pregnant women should receive 1 dose of Tdap vaccine during each pregnancy. 1 dose of Td every 10 years.  Varicella vaccine.** / Consult your health care provider. Pregnant females who do not have evidence of immunity should receive the first dose after pregnancy.  Zoster vaccine.** / 1 dose for adults aged 2 years or older.  Measles, mumps, rubella (MMR) vaccine.** / You need at least 1 dose of MMR if you were born in 1957 or later. You may also need a 2nd dose. For females of childbearing age, rubella immunity should be determined. If there is no evidence of immunity, females who are not pregnant should be vaccinated. If there is no evidence of immunity, females who are pregnant should delay immunization until after pregnancy.  Pneumococcal 13-valent conjugate (PCV13) vaccine.** / Consult your health care provider.  Pneumococcal polysaccharide (PPSV23) vaccine.** / 1 to 2 doses if you smoke cigarettes or if you have certain conditions.  Meningococcal vaccine.** / Consult your health care provider.  Hepatitis A vaccine.** / Consult your health care provider.  Hepatitis B vaccine.** / Consult your health care provider.  Haemophilus influenzae type b (Hib) vaccine.** / Consult your health care provider. Ages 48 years  and over  Blood pressure check.** / Every 1 to 2 years.  Lipid and cholesterol check.** / Every 5 years beginning at age 65 years.  Lung cancer screening. / Every year if you are aged 50-80 years and have a 30-pack-year history of smoking and currently smoke or have quit within the past 15 years. Yearly screening is stopped once you have quit smoking for at least 15 years or develop a health problem that would prevent you from having lung cancer  treatment.  Clinical breast exam.** / Every year after age 66 years.  BRCA-related cancer risk assessment.** / For women who have family members with a BRCA-related cancer (breast, ovarian, tubal, or peritoneal cancers).  Mammogram.** / Every year beginning at age 27 years and continuing for as long as you are in good health. Consult with your health care provider.  Pap test.** / Every 3 years starting at age 24 years through age 26 or 48 years with 3 consecutive normal Pap tests. Testing can be stopped between 65 and 70 years with 3 consecutive normal Pap tests and no abnormal Pap or HPV tests in the past 10 years.  HPV screening.** / Every 3 years from ages 30 years through ages 14 or 66 years with a history of 3 consecutive normal Pap tests. Testing can be stopped between 65 and 70 years with 3 consecutive normal Pap tests and no abnormal Pap or HPV tests in the past 10 years.  Fecal occult blood test (FOBT) of stool. / Every year beginning at age 39 years and continuing until age 25 years. You may not need to do this test if you get a colonoscopy every 10 years.  Flexible sigmoidoscopy or colonoscopy.** / Every 5 years for a flexible sigmoidoscopy or every 10 years for a colonoscopy beginning at age 64 years and continuing until age 89 years.  Hepatitis C blood test.** / For all people born from [birth year] through 1964 and any individual with known risks for hepatitis C.  Osteoporosis screening.** / A one-time screening for women ages 33 years and over and women at risk for fractures or osteoporosis.  Skin self-exam. / Monthly.  Influenza vaccine. / Every year.  Tetanus, diphtheria, and acellular pertussis (Tdap/Td) vaccine.** / 1 dose of Td every 10 years.  Varicella vaccine.** / Consult your health care provider.  Zoster vaccine.** / 1 dose for adults aged 59 years or older.  Pneumococcal 13-valent conjugate (PCV13) vaccine.** / Consult your health care provider.  Pneumococcal  polysaccharide (PPSV23) vaccine.** / 1 dose for all adults aged 45 years and older.  Meningococcal vaccine.** / Consult your health care provider.  Hepatitis A vaccine.** / Consult your health care provider.  Hepatitis B vaccine.** / Consult your health care provider.  Haemophilus influenzae type b (Hib) vaccine.** / Consult your health care provider. ** Family history and personal history of risk and conditions may change your health care provider's recommendations. Document Released: 05/08/2001 Document Revised: 03/17/2013 Document Reviewed: 08/07/2010 [facility] Patient Information 2015 [place], [place]. This information is not intended to replace advice given to you by your health care provider. Make sure you discuss any questions you have with your health care provider. Hypertension Hypertension, commonly called high blood pressure, is when the force of blood pumping through your arteries is too strong. Your arteries are the blood vessels that carry blood from your heart throughout your body. A blood pressure reading consists of a higher number over a lower number, such as 110/72. The higher number (systolic)  is the pressure inside your arteries when your heart pumps. The lower number (diastolic) is the pressure inside your arteries when your heart relaxes. Ideally you want your blood pressure below 120/80. Hypertension forces your heart to work harder to pump blood. Your arteries may become narrow or stiff. Having hypertension puts you at risk for heart disease, stroke, and other problems.  RISK FACTORS Some risk factors for high blood pressure are controllable. Others are not.  Risk factors you cannot control include:   Race. You may be at higher risk if you are African American.  Age. Risk increases with age.  Gender. Men are at higher risk than women before age 82 years. After age 52, women are at higher risk than men. Risk factors you can control include:  Not getting enough exercise  or physical activity.  Being overweight.  Getting too much fat, sugar, calories, or salt in your diet.  Drinking too much alcohol. SIGNS AND SYMPTOMS Hypertension does not usually cause signs or symptoms. Extremely high blood pressure (hypertensive crisis) may cause headache, anxiety, shortness of breath, and nosebleed. DIAGNOSIS  To check if you have hypertension, your health care provider will measure your blood pressure while you are seated, with your arm held at the level of your heart. It should be measured at least twice using the same arm. Certain conditions can cause a difference in blood pressure between your right and left arms. A blood pressure reading that is higher than normal on one occasion does not mean that you need treatment. If one blood pressure reading is high, ask your health care provider about having it checked again. TREATMENT  Treating high blood pressure includes making lifestyle changes and possibly taking medication. Living a healthy lifestyle can help lower high blood pressure. You may need to change some of your habits. Lifestyle changes may include:  Following the DASH diet. This diet is high in fruits, vegetables, and whole grains. It is low in salt, red meat, and added sugars.  Getting at least 2 1/2 hours of brisk physical activity every week.  Losing weight if necessary.  Not smoking.  Limiting alcoholic beverages.  Learning ways to reduce stress. If lifestyle changes are not enough to get your blood pressure under control, your health care provider may prescribe medicine. You may need to take more than one. Work closely with your health care provider to understand the risks and benefits. HOME CARE INSTRUCTIONS  Have your blood pressure rechecked as directed by your health care provider.   Only take medicine as directed by your health care provider. Follow the directions carefully. Blood pressure medicines must be taken as prescribed. The medicine  does not work as well when you skip doses. Skipping doses also puts you at risk for problems.   Do not smoke.   Monitor your blood pressure at home as directed by your health care provider. SEEK MEDICAL CARE IF:   You think you are having a reaction to medicines taken.  You have recurrent headaches or feel dizzy.  You have swelling in your ankles.  You have trouble with your vision. SEEK IMMEDIATE MEDICAL CARE IF:  You develop a severe headache or confusion.  You have unusual weakness, numbness, or feel faint.  You have severe chest or abdominal pain.  You vomit repeatedly.  You have trouble breathing. MAKE SURE YOU:   Understand these instructions.  Will watch your condition.  Will get help right away if you are not doing well or get  worse. Document Released: 03/12/2005 Document Revised: 03/17/2013 Document Reviewed: 01/02/2013 [facility] Patient Information 2015 [place], [place]. This information is not intended to replace advice given to you by your health care provider. Make sure you discuss any questions you have with your health care provider.

## 2013-09-28 NOTE — Progress Notes (Signed)
Subjective:    Patient ID: Kelli Reyes, female    DOB: 15-Nov-1945, 68 y.o.   MRN: [id number]  Hypertension This is a chronic problem. The current episode started more than 1 year ago. The problem has been gradually improving since onset. The problem is controlled. Pertinent negatives include no anxiety, blurred vision, chest pain, headaches, malaise/fatigue, neck pain, orthopnea, palpitations, peripheral edema, PND, shortness of breath or sweats. There are no associated agents to hypertension. Past treatments include angiotensin blockers and diuretics. The current treatment provides moderate improvement. There are no compliance problems.       Review of Systems  Constitutional: Negative.  Negative for fever, chills, malaise/fatigue, diaphoresis, activity change, appetite change, fatigue and unexpected weight change.  HENT: Negative.   Eyes: Negative.  Negative for blurred vision.  Respiratory: Negative.  Negative for apnea, cough, choking, chest tightness, shortness of breath, wheezing and stridor.   Cardiovascular: Negative.  Negative for chest pain, palpitations, orthopnea, leg swelling and PND.  Gastrointestinal: Negative.  Negative for nausea, vomiting, abdominal pain, diarrhea and constipation.  Endocrine: Negative.   Genitourinary: Negative.   Musculoskeletal: Negative.  Negative for arthralgias, back pain, gait problem, joint swelling, myalgias, neck pain and neck stiffness.  Skin: Negative.  Negative for rash.  Allergic/Immunologic: Negative.   Neurological: Negative.  Negative for dizziness, tremors, syncope, speech difficulty, weakness, light-headedness, numbness and headaches.  Hematological: Negative.  Negative for adenopathy. Does not bruise/bleed easily.  Psychiatric/Behavioral: Negative.        Objective:   Physical Exam  Vitals reviewed. Constitutional: She is oriented to person, place, and time. She appears well-developed and well-nourished. No distress.  HENT:    Head: Normocephalic and atraumatic.  Mouth/Throat: Oropharynx is clear and moist. No oropharyngeal exudate.  Eyes: Conjunctivae are normal. Right eye exhibits no discharge. Left eye exhibits no discharge. No scleral icterus.  Neck: Normal range of motion. Neck supple. No JVD present. No tracheal deviation present. No thyromegaly present.  Cardiovascular: Normal rate, regular rhythm, normal heart sounds and intact distal pulses.  Exam reveals no gallop and no friction rub.   No murmur heard. Pulmonary/Chest: Effort normal and breath sounds normal. No stridor. No respiratory distress. She has no wheezes. She has no rales. She exhibits no tenderness.  Abdominal: Soft. Bowel sounds are normal. She exhibits no distension and no mass. There is no tenderness. There is no rebound and no guarding.  Musculoskeletal: Normal range of motion. She exhibits no edema and no tenderness.  Lymphadenopathy:    She has no cervical adenopathy.  Neurological: She is oriented to person, place, and time.  Skin: Skin is warm and dry. No rash noted. She is not diaphoretic. No erythema. No pallor.  Psychiatric: She has a normal mood and affect. Her behavior is normal. Judgment and thought content normal.     Lab Results  Component Value Date   WBC 7.4 03/20/2013   HGB 14.4 03/20/2013   HCT 43.1 03/20/2013   PLT 193 03/20/2013   GLUCOSE 86 06/29/2013   CHOL 234* 06/29/2013   TRIG 139.0 06/29/2013   HDL 42.90 06/29/2013   LDLDIRECT 65.3 07/15/2012   LDLCALC 163* 06/29/2013   ALT 18 06/29/2013   AST 19 06/29/2013   NA 140 06/29/2013   K 3.6 06/29/2013   CL 105 06/29/2013   CREATININE 1.0 06/29/2013   BUN 16 06/29/2013   CO2 25 06/29/2013   TSH 1.04 06/29/2013   INR 1.1* 08/04/2012   HGBA1C 5.7 12/01/2012  Assessment & Plan:

## 2013-09-29 ENCOUNTER — Telehealth: Payer: Self-pay | Admitting: Internal Medicine

## 2013-09-29 ENCOUNTER — Encounter: Payer: Self-pay | Admitting: Internal Medicine

## 2013-09-29 NOTE — Assessment & Plan Note (Signed)
I will recheck her SPEP to see if this has progressed into a lymphoproliferative process

## 2013-09-29 NOTE — Assessment & Plan Note (Signed)
Her BP is well controlled Her lytes and renal function are stable 

## 2013-09-29 NOTE — Assessment & Plan Note (Addendum)

## 2013-09-29 NOTE — Assessment & Plan Note (Signed)
She agrees to start lipitor for this

## 2013-09-29 NOTE — Assessment & Plan Note (Signed)
She tells me that her pain has resolved

## 2013-09-29 NOTE — Telephone Encounter (Signed)
Relevant patient education assigned to patient using Emmi. ° °

## 2013-09-30 LAB — SPEP & IFE WITH QIG
Albumin ELP: 44.2 % — ABNORMAL LOW (ref 55.8–66.1)
Alpha-1-Globulin: 9.9 % — ABNORMAL HIGH (ref 2.9–4.9)
Alpha-2-Globulin: 11.4 % (ref 7.1–11.8)
Beta 2: 5.6 % (ref 3.2–6.5)
Beta Globulin: 6.2 % (ref 4.7–7.2)
GAMMA GLOBULIN: 22.7 % — AB (ref 11.1–18.8)
IGA: 248 mg/dL (ref 69–380)
IGG (IMMUNOGLOBIN G), SERUM: 1830 mg/dL — AB (ref 690–1700)
IGM, SERUM: 77 mg/dL (ref 52–322)
Total Protein, Serum Electrophoresis: 7.7 g/dL (ref 6.0–8.3)

## 2013-11-02 ENCOUNTER — Encounter: Payer: Self-pay | Admitting: Internal Medicine

## 2013-11-10 ENCOUNTER — Emergency Department (HOSPITAL_COMMUNITY)
Admission: EM | Admit: 2013-11-10 | Discharge: 2013-11-10 | Disposition: A | Discharge status: Home / Self Care | Payer: Medicare Other | Attending: Emergency Medicine | Admitting: Emergency Medicine

## 2013-11-10 ENCOUNTER — Encounter (HOSPITAL_COMMUNITY): Payer: Self-pay | Admitting: Emergency Medicine

## 2013-11-10 ENCOUNTER — Emergency Department (HOSPITAL_COMMUNITY): Payer: Medicare Other

## 2013-11-10 DIAGNOSIS — K219 Gastro-esophageal reflux disease without esophagitis: Secondary | ICD-10-CM | POA: Diagnosis not present

## 2013-11-10 DIAGNOSIS — M549 Dorsalgia, unspecified: Secondary | ICD-10-CM | POA: Diagnosis not present

## 2013-11-10 DIAGNOSIS — Z79899 Other long term (current) drug therapy: Secondary | ICD-10-CM | POA: Diagnosis not present

## 2013-11-10 DIAGNOSIS — G8929 Other chronic pain: Secondary | ICD-10-CM | POA: Insufficient documentation

## 2013-11-10 DIAGNOSIS — R52 Pain, unspecified: Secondary | ICD-10-CM | POA: Diagnosis present

## 2013-11-10 DIAGNOSIS — J4489 Other specified chronic obstructive pulmonary disease: Secondary | ICD-10-CM | POA: Insufficient documentation

## 2013-11-10 DIAGNOSIS — IMO0002 Reserved for concepts with insufficient information to code with codable children: Secondary | ICD-10-CM | POA: Diagnosis not present

## 2013-11-10 DIAGNOSIS — I10 Essential (primary) hypertension: Secondary | ICD-10-CM | POA: Diagnosis not present

## 2013-11-10 DIAGNOSIS — J449 Chronic obstructive pulmonary disease, unspecified: Secondary | ICD-10-CM | POA: Diagnosis not present

## 2013-11-10 DIAGNOSIS — M1990 Unspecified osteoarthritis, unspecified site: Secondary | ICD-10-CM

## 2013-11-10 DIAGNOSIS — M1710 Unilateral primary osteoarthritis, unspecified knee: Secondary | ICD-10-CM | POA: Diagnosis not present

## 2013-11-10 DIAGNOSIS — M25561 Pain in right knee: Secondary | ICD-10-CM

## 2013-11-10 DIAGNOSIS — G894 Chronic pain syndrome: Secondary | ICD-10-CM | POA: Diagnosis not present

## 2013-11-10 MED ORDER — TRAMADOL HCL 50 MG PO TABS
50.0000 mg | ORAL_TABLET | Freq: Once | ORAL | Status: AC
  Administered 2013-11-10: 50 mg via ORAL
  Filled 2013-11-10: qty 1

## 2013-11-10 MED ORDER — HYDROCODONE-ACETAMINOPHEN 5-325 MG PO TABS: 1.0000 | ORAL_TABLET | ORAL | Status: DC | PRN

## 2013-11-10 MED ORDER — HYDROCODONE-ACETAMINOPHEN 5-325 MG PO TABS
2.0000 | ORAL_TABLET | Freq: Once | ORAL | Status: AC
  Administered 2013-11-10: 2 via ORAL
  Filled 2013-11-10: qty 2

## 2013-11-10 NOTE — Discharge Instructions (Signed)
1. Medications: vicodin, usual home medications 2. Treatment: rest, drink plenty of fluids,  3. Follow Up: Please followup with your primary doctor in 3 days for discussion of your diagnoses and further evaluation after today's visit; Please also follow-up with Dr. Ronnald Ramp for further evaluation of your back pain   Back Pain, Adult Low back pain is very common. About 1 in 5 people have back pain.The cause of low back pain is rarely dangerous. The pain often gets better over time.About half of people with a sudden onset of back pain feel better in just 2 weeks. About 8 in 10 people feel better by 6 weeks.  CAUSES Some common causes of back pain include:  Strain of the muscles or ligaments supporting the spine.  Wear and tear (degeneration) of the spinal discs.  Arthritis.  Direct injury to the back. DIAGNOSIS Most of the time, the direct cause of low back pain is not known.However, back pain can be treated effectively even when the exact cause of the pain is unknown.Answering your caregiver's questions about your overall health and symptoms is one of the most accurate ways to make sure the cause of your pain is not dangerous. If your caregiver needs more information, he or she may order lab work or imaging tests (X-rays or MRIs).However, even if imaging tests show changes in your back, this usually does not require surgery. HOME CARE INSTRUCTIONS For many people, back pain returns.Since low back pain is rarely dangerous, it is often a condition that people can learn to [facility] their own.   Remain active. It is stressful on the back to sit or stand in one place. Do not sit, drive, or stand in one place for more than 30 minutes at a time. Take short walks on level surfaces as soon as pain allows.Try to increase the length of time you walk each day.  Do not stay in bed.Resting more than 1 or 2 days can delay your recovery.  Do not avoid exercise or work.Your body is made to move.It is  not dangerous to be active, even though your back may hurt.Your back will likely heal faster if you return to being active before your pain is gone.  Pay attention to your body when you bend and lift. Many people have less discomfortwhen lifting if they bend their knees, keep the load close to their bodies,and avoid twisting. Often, the most comfortable positions are those that put less stress on your recovering back.  Find a comfortable position to sleep. Use a firm mattress and lie on your side with your knees slightly bent. If you lie on your back, put a pillow under your knees.  Only take over-the-counter or prescription medicines as directed by your caregiver. Over-the-counter medicines to reduce pain and inflammation are often the most helpful.Your caregiver may prescribe muscle relaxant drugs.These medicines help dull your pain so you can more quickly return to your normal activities and healthy exercise.  Put ice on the injured area.  Put ice in a plastic bag.  Place a towel between your skin and the bag.  Leave the ice on for 15-20 minutes, 03-04 times a day for the first 2 to 3 days. After that, ice and heat may be alternated to reduce pain and spasms.  Ask your caregiver about trying back exercises and gentle massage. This may be of some benefit.  Avoid feeling anxious or stressed.Stress increases muscle tension and can worsen back pain.It is important to recognize when you are anxious  or stressed and learn ways to manage it.Exercise is a great option. SEEK MEDICAL CARE IF:  You have pain that is not relieved with rest or medicine.  You have pain that does not improve in 1 week.  You have new symptoms.  You are generally not feeling well. SEEK IMMEDIATE MEDICAL CARE IF:   You have pain that radiates from your back into your legs.  You develop new bowel or bladder control problems.  You have unusual weakness or numbness in your arms or legs.  You develop nausea  or vomiting.  You develop abdominal pain.  You feel faint. Document Released: 03/12/2005 Document Revised: 09/11/2011 Document Reviewed: 07/14/2013 [facility] Patient Information 2015 [place], [place]. This information is not intended to replace advice given to you by your health care provider. Make sure you discuss any questions you have with your health care provider.

## 2013-11-10 NOTE — ED Notes (Signed)
Pt states she is having pain in her legs, knees, and her back  Pt states she has been feeling like this for about 3 weeks or so  Pt states she has bone spurs in her back  Pt states she was taking pain medication but she stopped taking it because she did not like the way it made her feel   Pt states she is aching all over

## 2013-11-10 NOTE — ED Provider Notes (Signed)
Medical screening examination/treatment/procedure(s) were conducted as a shared visit with non-physician practitioner(s) and myself.  I personally evaluated the patient during the encounter.   EKG Interpretation None      Pt with back pain, no radicular pain, no neuro deficits., no red flags to indicate other pathology such as epidural abscess.  Feels better in ED, sitting up in bed, laughing, ready to go home.    Malvin Johns, MD 11/10/13 [phone]

## 2013-11-10 NOTE — ED Provider Notes (Signed)
CSN: [id number]     Arrival date & time 11/10/13  2001 History   First MD Initiated Contact with Patient 11/10/13 2026     Chief Complaint  Patient presents with  . Generalized Body Aches     (Consider location/radiation/quality/duration/timing/severity/associated sxs/prior Treatment) The history is provided by the patient and medical records. No language interpreter was used.    Kelli Reyes is a 68 y.o. female  with a hx of COPD, GERD, HTN, OA presents to the Emergency Department complaining of gradual, persistent, progressively worsening generalized body aches most intense in her legs, knees and back onset 3 weeks ago.  Pt reports that she used to take pain medication, but stopped because she did not like the way it made her feel.  Patient reports she has seen Dr. Ronnald Ramp for this pain in the past. She reports that she used to take oxycodone but she did not like the way made her feel. She reports gradually increasing low back pain and generalized body aches for the last month. She denies gait disturbance, leg weakness, loss of bowel or bladder control. She also denies falls, IV drug use, known trauma or history of compression fracture. Associated symptoms include bilateral knee pain right worse than left.  Patient reports taking Goody powders without significant relief.  She reports she has an appointment with Dr. Ronnald Ramp for in September.  Nothing makes it better and being on her feet makes it worse.  Pt denies fever, chills, headache, neck pain, chest pain, shortness of breath, abdominal pain nausea, vomiting, diarrhea, weakness, dizziness, syncope, dysuria, hematuria.     Past Medical History  Diagnosis Date  . COPD (chronic obstructive pulmonary disease)   . GERD (gastroesophageal reflux disease)   . Hypertension   . Osteoarthritis   . Lung nodules    Past Surgical History  Procedure Laterality Date  . Abdominal hysterectomy     Family History  Problem Relation Age of Onset  .  Hyperlipidemia Other   . Cancer Neg Hx   . Stroke Neg Hx   . Heart disease Neg Hx    History  Substance Use Topics  . Smoking status: Former Smoker -- 2.00 packs/day for 25 years    Types: Cigarettes    Quit date: 09/03/1996  . Smokeless tobacco: Never Used  . Alcohol Use: No     Comment: beer 2-3 times a week   OB History   Grav Para Term Preterm Abortions TAB SAB Ect Mult Living                 Review of Systems  Constitutional: Negative for fever and fatigue.  Respiratory: Negative for chest tightness and shortness of breath.   Cardiovascular: Negative for chest pain.  Gastrointestinal: Negative for nausea, vomiting, abdominal pain and diarrhea.  Genitourinary: Negative for dysuria, urgency, frequency and hematuria.  Musculoskeletal: Positive for arthralgias and back pain. Negative for gait problem, joint swelling, neck pain and neck stiffness.  Skin: Negative for rash.  Neurological: Negative for weakness, light-headedness, numbness and headaches.  All other systems reviewed and are negative.     Allergies  Ibuprofen  Home Medications   Prior to Admission medications   Medication Sig Start Date End Date Taking? Authorizing Provider  atorvastatin (LIPITOR) 40 MG tablet Take 1 tablet (40 mg total) by mouth daily. 09/28/13  Yes Janith Lima, MD  fexofenadine (ALLEGRA) 180 MG tablet Take 180 mg by mouth daily.   Yes Historical Provider, MD  losartan-hydrochlorothiazide [facility]) 50-12.5  MG per tablet Take 1 tablet by mouth daily. 03/27/13  Yes Janith Lima, MD  potassium chloride (KLOR-CON) 8 MEQ tablet Take 1 tablet (8 mEq total) by mouth 2 (two) times daily. 09/28/13  Yes Janith Lima, MD  ranitidine (ZANTAC) 150 MG tablet Take 150 mg by mouth daily.   Yes Historical Provider, MD  Umeclidinium-Vilanterol (ANORO ELLIPTA) 62.5-25 MCG/INH AEPB Inhale 1 puff into the lungs daily. 09/28/13  Yes Janith Lima, MD  HYDROcodone-acetaminophen (NORCO/VICODIN) 5-325 MG per tablet  Take 1-2 tablets by mouth every 4 (four) hours as needed for moderate pain or severe pain. 11/10/13   Chenee Munns, PA-C  mometasone (NASONEX) 50 MCG/ACT nasal spray Place 2 sprays into the nose daily. 09/03/11 06/29/13  Janith Lima, MD   BP 131/89  Pulse 98  Temp(Src) 99.5 F (37.5 C) (Oral)  Resp 18  Wt 140 lb (63.504 kg)  SpO2 97% Physical Exam  Nursing note and vitals reviewed. Constitutional: She is oriented to person, place, and time. She appears well-developed and well-nourished. No distress.  Awake, alert, nontoxic appearance  HENT:  Head: Normocephalic and atraumatic.  Mouth/Throat: Oropharynx is clear and moist. No oropharyngeal exudate.  Eyes: Conjunctivae are normal. No scleral icterus.  Neck: Normal range of motion. Neck supple.  Full ROM without pain  Cardiovascular: Normal rate, regular rhythm, normal heart sounds and intact distal pulses.   Pulmonary/Chest: Effort normal and breath sounds normal. No respiratory distress. She has no wheezes.  Equal chest expansion Clear and equal breath sounds  Abdominal: Soft. Bowel sounds are normal. She exhibits no distension and no mass. There is no tenderness. There is no rebound and no guarding.  abd soft and nontender  Musculoskeletal: Normal range of motion. She exhibits no edema.  Full range of motion of the T-spine and L-spine No tenderness to palpation of the spinous processes of the T-spine  Mild tenderness to palpation of the spinous processes of the L-spine - L4-L5 Mild tenderness to palpation of the paraspinous muscles of the L-spine  Lymphadenopathy:    She has no cervical adenopathy.  Neurological: She is alert and oriented to person, place, and time. She has normal reflexes. She exhibits normal muscle tone. Coordination normal.  Speech is clear and goal oriented, follows commands Normal 5/5 strength in upper and lower extremities bilaterally including dorsiflexion and plantar flexion, strong and equal grip  strength Sensation normal to light and sharp touch Moves extremities without ataxia, coordination intact Normal gait Normal balance   Skin: Skin is warm and dry. No rash noted. She is not diaphoretic. No erythema.  Psychiatric: She has a normal mood and affect. Her behavior is normal.    ED Course  Procedures (including critical care time) Labs Review Labs Reviewed - No data to display  Imaging Review Dg Lumbar Spine Complete  11/10/2013   CLINICAL DATA:  Body pain.  Particularly in the lumbar spine.  EXAM: LUMBAR SPINE - COMPLETE 4+ VIEW  COMPARISON:  07/15/2012 ; 04/19/2010  FINDINGS: Injection granulomata project over the back of the right iliac crest. Known calcified lymph nodes in the upper abdomen.  Mild facet arthropathy on the right at L5-S1, and bilaterally at L4-5.  Aortoiliac atherosclerotic vascular disease. There is 3 mm of grade 1 anterolisthesis at L4-5, unchanged from the 2012 exam. Intervertebral disc spaces are maintained. Mild anterior intervertebral spurring at several levels in the lumbar spine. No fracture or acute subluxation.  IMPRESSION: 1. Mild lumbar spondylosis, without fracture or acute  subluxation observed. 2. Degenerative anterolisthesis at L4-5.   Electronically Signed   By: Sherryl Barters M.D.   On: 11/10/2013 21:27   Dg Knee Complete 4 Views Right  11/10/2013   CLINICAL DATA:  Right knee pain.  EXAM: RIGHT KNEE - COMPLETE 4+ VIEW  COMPARISON:  None.  FINDINGS: There is no evidence of fracture, dislocation, or joint effusion. There is no evidence of arthropathy or other focal bone abnormality. Soft tissues are unremarkable.  IMPRESSION: Normal exam.   Electronically Signed   By: Rozetta Nunnery M.D.   On: 11/10/2013 21:26     EKG Interpretation None      MDM   Final diagnoses:  Exacerbation of chronic back pain  Arthralgia of knee, right  Chronic pain syndrome  OSTEOARTHRITIS   Kelli Reyes presents with hx of chronic back pain and stopped taking  her medications several months ago.  Patient has had increasing body aches and back pain since that time.  Patient reports pain is unchanged in quality that has increased in quantity.  Patient did have a low-grade fever here in the emergency department however at this time I appears low likelihood of epidural abscess or discitis. Will obtain plain films to rule out compression fracture. Patient with normal neurologic exam.  Patient denies abdominal pain, nausea, vomiting, cough, dysuria or other sources of infection.    10:47 PM Imaging unremarkable.  No evidence of compression fracture. Mild lumbar spondylosis and degenerative anterolisthesis at L4-L5 which is the site of patient's pain.  Patient pain controlled with tramadol and Vicodin here in the emergency department. She reports she's taken tramadol in the past and it has not helped and is on a prescription for this. Will DC home with a small prescription of Vicodin.  I have personally reviewed patient's vitals, nursing note and any pertinent labs or imaging.  I performed an undressed physical exam.    At this time, it has been determined that no acute conditions requiring further emergency intervention. The patient/guardian have been advised of the diagnosis and plan. I reviewed all labs and imaging including any potential incidental findings. We have discussed signs and symptoms that warrant return to the ED, such as worsening back pain, loss of bowel or bladder control, increasing fevers.  Patient/guardian has voiced understanding and agreed to follow-up with the PCP or specialist in 3 days.  Vital signs are stable at discharge.   BP 131/89  Pulse 98  Temp(Src) 99.5 F (37.5 C) (Oral)  Resp 18  Wt 140 lb (63.504 kg)  SpO2 97%   The patient was discussed with and seen by Dr. Tamera Punt who agrees with the treatment plan.      Kelli Soho Larina Lieurance, PA-C 11/10/13 2249

## 2013-11-18 ENCOUNTER — Other Ambulatory Visit (INDEPENDENT_AMBULATORY_CARE_PROVIDER_SITE_OTHER): Payer: Medicare Other

## 2013-11-18 ENCOUNTER — Ambulatory Visit (INDEPENDENT_AMBULATORY_CARE_PROVIDER_SITE_OTHER): Payer: Medicare Other | Admitting: Family Medicine

## 2013-11-18 ENCOUNTER — Encounter: Payer: Self-pay | Admitting: Family Medicine

## 2013-11-18 VITALS — BP 122/68 | HR 108 | Ht 62.0 in | Wt 141.0 lb

## 2013-11-18 DIAGNOSIS — M25569 Pain in unspecified knee: Secondary | ICD-10-CM

## 2013-11-18 DIAGNOSIS — M461 Sacroiliitis, not elsewhere classified: Secondary | ICD-10-CM

## 2013-11-18 DIAGNOSIS — G894 Chronic pain syndrome: Secondary | ICD-10-CM

## 2013-11-18 DIAGNOSIS — M25561 Pain in right knee: Secondary | ICD-10-CM

## 2013-11-18 DIAGNOSIS — Z79899 Other long term (current) drug therapy: Secondary | ICD-10-CM

## 2013-11-18 DIAGNOSIS — M549 Dorsalgia, unspecified: Secondary | ICD-10-CM | POA: Insufficient documentation

## 2013-11-18 DIAGNOSIS — M545 Low back pain, unspecified: Secondary | ICD-10-CM

## 2013-11-18 LAB — SEDIMENTATION RATE: Sed Rate: 94 mm/hr — ABNORMAL HIGH (ref 0–22)

## 2013-11-18 LAB — FERRITIN: Ferritin: 208.2 ng/mL (ref 10.0–291.0)

## 2013-11-18 LAB — ANGIOTENSIN CONVERTING ENZYME: ANGIOTENSIN-CONVERTING ENZYME: 64 U/L — AB (ref 8–52)

## 2013-11-18 LAB — VITAMIN B12: VITAMIN B 12: 477 pg/mL (ref 211–911)

## 2013-11-18 LAB — C-REACTIVE PROTEIN: CRP: 4.3 mg/dL (ref 0.5–20.0)

## 2013-11-18 LAB — IRON: Iron: 35 ug/dL — ABNORMAL LOW (ref 42–145)

## 2013-11-18 LAB — RHEUMATOID FACTOR: Rhuematoid fact SerPl-aCnc: <10 IU/mL (ref <=14)

## 2013-11-18 MED ORDER — TRAMADOL HCL 50 MG PO TABS: 50.0000 mg | ORAL_TABLET | Freq: Every evening | ORAL | Status: DC | PRN

## 2013-11-18 NOTE — Assessment & Plan Note (Signed)
Patient does have chronic pain but also has a varies small monoclonal antibody without a M spiked. Patient does have family history of her sister dying at a very young age secondary to respiratory failure. Labs ordered to rule out autoimmune diseases including sarcoidosis and HLA-B27. Her continue to monitor and remaining consider medications for chronic pain in the interim. We'll discuss with primary care provider.

## 2013-11-18 NOTE — Patient Instructions (Signed)
Good to see you We will get labs downstairs today.  Wear brace only with a lot of activity.  Ice 20 minutes 2 times daily. Usually after activity and before bed. Exercises 3 times a week. Alternate back and knee.  Tramadol only if you need it at night.  Take tylenol 650 mg three times a day is the best evidence based medicine we have for arthritis.  Glucosamine sulfate 750mg  twice a day is a supplement that has been shown to help moderate to severe arthritis. Vitamin D 2000 IU daily Fish oil 2 grams daily.  Tumeric 500mg  twice daily.  Capsaicin topically up to four times a day may also help with pain. Come back in 4 weeks to make sure you are doing better.

## 2013-11-18 NOTE — Progress Notes (Signed)
[facility] [address], [place] [address] Phone: [phone] Subjective:    I'm seeing this patient by the request  of:  Scarlette Calico, MD   CC: Back pain, knee pain, arthritis  POE:UMPNTIRWER Kelli Reyes is a 68 y.o. female coming in with complaint of low back pain, knee pain and chronic pain syndrome.  1. back pain. Patient recently did have an exacerbation of her chronic back pain. Patient was seen in the [facility] long emergency department 8 days ago. New x-rays were taken at that time. These were reviewed by me today. Patient did have a very small grade 1 anterior listhesis at L4-L5 and mild degenerative changes noted. Otherwise fairly unremarkable. Patient describes the pain as dull aching sensation neck and have a sharp pain with certain movements. Denies any significant radiation down her legs. Denies any numbness or weakness. Reflexes severity is 7/10. Does not respond well to over-the-counter medications. He can stop her from activities and can wake her up at night.  2. the patient states she is also having right knee pain. Patient was seen at the same time and have x-rays of her knee that were completely unremarkable. These were reviewed by me today. Patient states that the pain is only when she is ambulating. Patient states that it is just a dull aching pain. Denies that any movement seems to make her worse. Patient denies any nighttime awakening states that he can has a dull aching sensation he can keep her up at night. Patient the severity of 8/10.  3. the patient states it just aches and pains all over her body is most days a week. Patient does not like them to take any pain medications. Patient was given Norco by the emergency department and at one time but because it made her sleepy she did not want to continue it. Patient states that it feels like the pain is getting worse overall and does stop her from certain activities. Patient finds it  difficult to go shopping or do certain activities because she no she will be significantly more painful later on. Patient has not seen any other specialist for this. Patient though has had workup by primary care provider for an SPEP and did show a very mild elevation IgG and a mild increase in gamma globulin but no and spiked noted. Patient has had nonspecific hepatitis panel is that have been nondescript on positivity.     Past medical history, social, surgical and family history all reviewed in electronic medical record.   Review of Systems: No headache, visual changes, nausea, vomiting, diarrhea, constipation, dizziness, abdominal pain, skin rash, fevers, chills, night sweats, weight loss, swollen lymph nodes, body aches, joint swelling, muscle aches, chest pain, shortness of breath, mood changes.   Objective Blood pressure 122/68, pulse 108, height 5\' 2"  (1.575 m), weight 141 lb (63.957 kg), SpO2 99.00%.  General: No apparent distress alert and oriented x3 mood and affect normal, dressed appropriately.  HEENT: Pupils equal, extraocular movements intact  Respiratory: Patient's speak in full sentences and does not appear short of breath  Cardiovascular: No lower extremity edema, non tender, no erythema  Skin: Warm dry intact with no signs of infection or rash on extremities or on axial skeleton.  Abdomen: Soft nontender  Neuro: Cranial nerves II through XII are intact, neurovascularly intact in all extremities with 2+ DTRs and 2+ pulses.  Lymph: No lymphadenopathy of posterior or anterior cervical chain or axillae bilaterally.  Gait normal  with good balance and coordination.  MSK:  Non tender with full range of motion and good stability and symmetric strength and tone of shoulders, elbows, wrist, hip, and ankles bilaterally. Mild osteoarthritic changes in multiple joints. Generalized soreness in multiple points as well.  Back Exam:  Inspection: Unremarkable  Motion: Flexion 25 deg, Extension  15 deg, Side Bending to 35 deg bilaterally,  Rotation to 35 deg bilaterally  SLR laying: Negative  XSLR laying: Negative  Palpable tenderness: Positive tenderness to palpation in the paraspinal musculature of the lumbar spine. FABER: negative. Sensory change: Gross sensation intact to all lumbar and sacral dermatomes.  Reflexes: 2+ at both patellar tendons, 2+ at achilles tendons, Babinski's downgoing.  Strength at foot  Plantar-flexion: 5/5 Dorsi-flexion: 5/5 Eversion: 5/5 Inversion: 5/5  Leg strength  Quad: 5/5 Hamstring: 5/5 Hip flexor: 4/5 Hip abductors: 4/5  Gait unremarkable.  Knee: Right Normal to inspection with no erythema or effusion or obvious bony abnormalities. Mild generalized tenderness to the knee itself to palpation but no specific findings ROM full in flexion and extension and lower leg rotation. Ligaments with solid consistent endpoints including ACL, PCL, LCL, MCL. Negative Mcmurray's, Apley's, and Thessalonian tests. Non painful patellar compression. Patellar glide without crepitus. Patellar and quadriceps tendons unremarkable. Hamstring and quadriceps strength is normal.  Contralateral knee also minor discomfort with palpation.  MSK US performed of: Right This study was ordered, performed, and interpreted by Charlann Boxer D.O.  Knee: All structures visualized. Anteromedial, anterolateral, posteromedial, and posterolateral menisci unremarkable without tearing, fraying, effusion, or displacement. Patellar Tendon unremarkable on long and transverse views without effusion. No abnormality of prepatellar bursa. LCL and MCL unremarkable on long and transverse views. No abnormality of origin of medial or lateral head of the gastrocnemius.  IMPRESSION:  NORMAL ULTRASONOGRAPHIC EXAMINATION OF THE KNEE.     Impression and Recommendations:     This case required medical decision making of moderate complexity.

## 2013-11-18 NOTE — Assessment & Plan Note (Signed)
Patient back pain seems to be nondescript at this time. Patient's previous x-ray shows history of sacral hiatus as well as an L4-L5 spondylosis. I do not find his to be any specific findings. Patient likely just has more of a muscle imbalances and was given home exercises that I think would be helpful. We discussed over-the-counter medications he to be helpful and patient was given tramadol for breakthrough pain and warned of potential side effects. Patient does give a history of sister having difficulty with lots of aches and pains before she died of pulmonary failure greater than 10 years ago. I do feel that further workup with the family history and patient's nondescript pain and sacroiliitis is necessary. Labs ordered today to rule out certain autoimmune diseases as well as HLA-B27. Patient will try the conservative management and come back in 3 weeks for further evaluation.

## 2013-11-18 NOTE — Assessment & Plan Note (Signed)
Patient's right knee pain seems to be more secondary to weakness. Patient has had an elevated is our previously. We discussed home exercises, bracing and was fitted for a tracking brace today. We discussed an icing protocol and over-the-counter medications that could be beneficial. Patient will try these interventions and come back and see me again in 3 weeks for further evaluation. Continued to have pain we Kelly's consider an intra-articular injection.

## 2013-11-19 LAB — ANA: Anti Nuclear Antibody(ANA): NEGATIVE

## 2013-11-21 ENCOUNTER — Other Ambulatory Visit: Payer: Self-pay | Admitting: Internal Medicine

## 2013-11-24 ENCOUNTER — Telehealth: Payer: Self-pay | Admitting: Internal Medicine

## 2013-11-24 NOTE — Telephone Encounter (Signed)
Patient states that she has not had an inhaler for over a week.  She states she does not have Part D to cover her meds right now.  She is requesting a script for a cheaper inhaler.  She is requesting a sample if we have any left.

## 2013-11-24 NOTE — Telephone Encounter (Signed)
She can come get samples of anoro

## 2013-11-25 NOTE — Telephone Encounter (Signed)
Unable to LMOVM , per voice prompt pt is unable to receive due to full mailbox.

## 2013-12-07 ENCOUNTER — Telehealth: Payer: Self-pay | Admitting: Geriatric Medicine

## 2013-12-07 ENCOUNTER — Other Ambulatory Visit (INDEPENDENT_AMBULATORY_CARE_PROVIDER_SITE_OTHER): Payer: Medicare Other

## 2013-12-07 ENCOUNTER — Ambulatory Visit (INDEPENDENT_AMBULATORY_CARE_PROVIDER_SITE_OTHER)
Admission: RE | Admit: 2013-12-07 | Discharge: 2013-12-07 | Disposition: A | Discharge status: Home / Self Care | Payer: Medicare Other | Source: Ambulatory Visit | Attending: Internal Medicine | Admitting: Internal Medicine

## 2013-12-07 ENCOUNTER — Encounter: Payer: Self-pay | Admitting: Internal Medicine

## 2013-12-07 ENCOUNTER — Ambulatory Visit (INDEPENDENT_AMBULATORY_CARE_PROVIDER_SITE_OTHER): Payer: Medicare Other | Admitting: Internal Medicine

## 2013-12-07 VITALS — BP 142/90 | HR 74 | Temp 99.4°F | Resp 18 | Ht 62.0 in | Wt 144.0 lb

## 2013-12-07 DIAGNOSIS — M545 Low back pain, unspecified: Secondary | ICD-10-CM

## 2013-12-07 DIAGNOSIS — F321 Major depressive disorder, single episode, moderate: Secondary | ICD-10-CM

## 2013-12-07 DIAGNOSIS — I10 Essential (primary) hypertension: Secondary | ICD-10-CM

## 2013-12-07 DIAGNOSIS — G894 Chronic pain syndrome: Secondary | ICD-10-CM

## 2013-12-07 DIAGNOSIS — R918 Other nonspecific abnormal finding of lung field: Secondary | ICD-10-CM

## 2013-12-07 DIAGNOSIS — D472 Monoclonal gammopathy: Secondary | ICD-10-CM

## 2013-12-07 DIAGNOSIS — M25561 Pain in right knee: Secondary | ICD-10-CM

## 2013-12-07 DIAGNOSIS — E876 Hypokalemia: Secondary | ICD-10-CM

## 2013-12-07 DIAGNOSIS — R7309 Other abnormal glucose: Secondary | ICD-10-CM

## 2013-12-07 LAB — CBC WITH DIFFERENTIAL/PLATELET
BASOS PCT: 0.3 % (ref 0.0–3.0)
Basophils Absolute: 0 10*3/uL (ref 0.0–0.1)
Eosinophils Absolute: 0.3 10*3/uL (ref 0.0–0.7)
Eosinophils Relative: 3.1 % (ref 0.0–5.0)
HCT: 36.1 % (ref 36.0–46.0)
Hemoglobin: 11.8 g/dL — ABNORMAL LOW (ref 12.0–15.0)
Lymphocytes Relative: 14.7 % (ref 12.0–46.0)
Lymphs Abs: 1.4 10*3/uL (ref 0.7–4.0)
MCHC: 32.5 g/dL (ref 30.0–36.0)
MCV: 87.7 fl (ref 78.0–100.0)
Monocytes Absolute: 0.9 10*3/uL (ref 0.1–1.0)
Monocytes Relative: 9.8 % (ref 3.0–12.0)
Neutro Abs: 6.6 10*3/uL (ref 1.4–7.7)
Neutrophils Relative %: 72.1 % (ref 43.0–77.0)
Platelets: 274 10*3/uL (ref 150.0–400.0)
RBC: 4.12 Mil/uL (ref 3.87–5.11)
RDW: 14.4 % (ref 11.5–15.5)
WBC: 9.2 10*3/uL (ref 4.0–10.5)

## 2013-12-07 LAB — URINALYSIS, ROUTINE W REFLEX MICROSCOPIC
BILIRUBIN URINE: NEGATIVE
Hgb urine dipstick: NEGATIVE
Ketones, ur: NEGATIVE
Leukocytes, UA: NEGATIVE
Nitrite: NEGATIVE
RBC / HPF: NONE SEEN (ref 0–?)
Specific Gravity, Urine: <=1.005 — AB (ref 1.000–1.030)
Total Protein, Urine: NEGATIVE
UROBILINOGEN UA: 1 (ref 0.0–1.0)
Urine Glucose: NEGATIVE
WBC, UA: NONE SEEN (ref 0–?)
pH: 7 (ref 5.0–8.0)

## 2013-12-07 LAB — COMPREHENSIVE METABOLIC PANEL
ALBUMIN: 2.9 g/dL — AB (ref 3.5–5.2)
ALT: 16 U/L (ref 0–35)
AST: 25 U/L (ref 0–37)
Alkaline Phosphatase: 138 U/L — ABNORMAL HIGH (ref 39–117)
BUN: 7 mg/dL (ref 6–23)
CALCIUM: 9.2 mg/dL (ref 8.4–10.5)
CO2: 24 mEq/L (ref 19–32)
Chloride: 105 mEq/L (ref 96–112)
Creatinine, Ser: 0.9 mg/dL (ref 0.4–1.2)
GFR: 77.34 mL/min (ref >60.00)
Glucose, Bld: 81 mg/dL (ref 70–99)
POTASSIUM: 2.8 meq/L — AB (ref 3.5–5.1)
Sodium: 139 mEq/L (ref 135–145)
TOTAL PROTEIN: 8 g/dL (ref 6.0–8.3)
Total Bilirubin: 0.3 mg/dL (ref 0.2–1.2)

## 2013-12-07 LAB — C-REACTIVE PROTEIN: CRP: 7.8 mg/dL (ref 0.5–20.0)

## 2013-12-07 LAB — TSH: TSH: 1.32 u[IU]/mL (ref 0.35–4.50)

## 2013-12-07 LAB — SEDIMENTATION RATE: Sed Rate: 100 mm/hr — ABNORMAL HIGH (ref 0–22)

## 2013-12-07 MED ORDER — POTASSIUM CHLORIDE ER 8 MEQ PO TBCR: 8.0000 meq | EXTENDED_RELEASE_TABLET | Freq: Three times a day (TID) | ORAL | Status: DC

## 2013-12-07 MED ORDER — LEVOMILNACIPRAN HCL ER 40 MG PO CP24: 1.0000 | ORAL_CAPSULE | Freq: Every day | ORAL | Status: DC

## 2013-12-07 MED ORDER — HYDROCODONE-ACETAMINOPHEN 5-325 MG PO TABS: 1.0000 | ORAL_TABLET | ORAL | Status: DC | PRN

## 2013-12-07 NOTE — Telephone Encounter (Signed)
The lab called the potassium result in as critical. The patient's potassium is 2.8. The rest of the labs are not complete yet.

## 2013-12-07 NOTE — Patient Instructions (Signed)

## 2013-12-07 NOTE — Telephone Encounter (Signed)
Spoke to Dr. Ronnald Ramp and gave him a verbal informing him of this critical lab result.

## 2013-12-07 NOTE — Assessment & Plan Note (Signed)
There is a new dominant lesion on the CXR  I have asked her to have a CT chest done

## 2013-12-07 NOTE — Assessment & Plan Note (Signed)
I will recheck her CBC and her SPEP to see if this has progressed into a lymphoproliferative disease

## 2013-12-07 NOTE — Progress Notes (Signed)
Subjective:    Patient ID: Kelli Reyes, female    DOB: [birth year], 68 y.o.   MRN: [id number]  Back Pain This is a chronic problem. The current episode started more than 1 year ago. The problem occurs intermittently. The problem is unchanged. The pain is present in the lumbar spine. The quality of the pain is described as aching. The pain does not radiate. The pain is at a severity of 4/10. The pain is moderate. The pain is worse during the day. The symptoms are aggravated by position, standing and bending. Pertinent negatives include no abdominal pain, chest pain, fever, headaches, numbness or weakness. She has tried analgesics and home exercises (tramadol does not help the pain) for the symptoms. The treatment provided mild relief.      Review of Systems  Constitutional: Positive for activity change, appetite change, fatigue and unexpected weight change. Negative for fever, chills and diaphoresis.  HENT: Negative.  Negative for sinus pressure, sore throat, tinnitus, trouble swallowing and voice change.   Eyes: Negative.   Respiratory: Positive for shortness of breath. Negative for apnea, cough, choking, chest tightness, wheezing and stridor.   Cardiovascular: Negative.  Negative for chest pain, palpitations and leg swelling.  Gastrointestinal: Negative.  Negative for nausea, vomiting, abdominal pain, diarrhea, constipation, blood in stool, abdominal distention, anal bleeding and rectal pain.  Endocrine: Negative.   Genitourinary: Negative.   Musculoskeletal: Positive for arthralgias and back pain. Negative for gait problem, joint swelling, myalgias, neck pain and neck stiffness.  Skin: Positive for rash. Negative for color change, pallor and wound.       She complains of dry/itchy skin  Allergic/Immunologic: Negative.   Neurological: Negative.  Negative for dizziness, tremors, speech difficulty, weakness, light-headedness, numbness and headaches.  Hematological: Negative.  Negative for  adenopathy. Does not bruise/bleed easily.  Psychiatric/Behavioral: Positive for dysphoric mood and decreased concentration. Negative for suicidal ideas, hallucinations, behavioral problems, confusion, sleep disturbance, self-injury and agitation. The patient is not nervous/anxious and is not hyperactive.        She complains of anhedonia, sleeping too much, fatigue, sadness       Objective:   Physical Exam  Vitals reviewed. Constitutional: She is oriented to person, place, and time. She appears well-developed and well-nourished.  Non-toxic appearance. She does not have a sickly appearance. She does not appear ill. No distress.  HENT:  Head: Normocephalic and atraumatic.  Mouth/Throat: Oropharynx is clear and moist. No oropharyngeal exudate.  Eyes: Conjunctivae are normal. Right eye exhibits no discharge. Left eye exhibits no discharge. No scleral icterus.  Neck: Normal range of motion. Neck supple. No JVD present. No tracheal deviation present. No thyromegaly present.  Cardiovascular: Normal rate, regular rhythm, normal heart sounds and intact distal pulses.  Exam reveals no gallop and no friction rub.   No murmur heard. Pulmonary/Chest: Effort normal and breath sounds normal. No stridor. No respiratory distress. She has no wheezes. She has no rales. She exhibits no tenderness.  Abdominal: Soft. Bowel sounds are normal. She exhibits no distension and no mass. There is no tenderness. There is no rebound and no guarding.  Musculoskeletal: Normal range of motion. She exhibits no edema.       Lumbar back: Normal. She exhibits normal range of motion, no tenderness, no bony tenderness, no swelling, no edema, no deformity, no laceration, no pain, no spasm and normal pulse.  Lymphadenopathy:    She has no cervical adenopathy.  Neurological: She is oriented to person, place, and time.  Skin: Skin is warm and dry. No rash noted. She is not diaphoretic. No erythema. No pallor.  Psychiatric: Her  behavior is normal. Judgment and thought content normal. Her mood appears not anxious. Her affect is not angry, not blunt, not labile and not inappropriate. Her speech is not rapid and/or pressured, not delayed and not tangential. She is not agitated, not slowed and not withdrawn. Cognition and memory are normal. Cognition and memory are not impaired. She exhibits a depressed mood. She expresses no homicidal and no suicidal ideation. She expresses no suicidal plans and no homicidal plans. She exhibits normal recent memory and normal remote memory. She is attentive.    Lab Results  Component Value Date   WBC 8.4 09/28/2013   HGB 13.4 09/28/2013   HCT 40.1 09/28/2013   PLT 303.0 09/28/2013   GLUCOSE 116* 09/28/2013   CHOL 234* 06/29/2013   TRIG 139.0 06/29/2013   HDL 42.90 06/29/2013   LDLDIRECT 65.3 07/15/2012   LDLCALC 163* 06/29/2013   ALT 16 09/28/2013   AST 23 09/28/2013   NA 138 09/28/2013   K 3.6 09/28/2013   CL 105 09/28/2013   CREATININE 0.9 09/28/2013   BUN 13 09/28/2013   CO2 25 09/28/2013   TSH 1.04 06/29/2013   INR 1.1* 08/04/2012   HGBA1C 5.7 12/01/2012        Assessment & Plan:

## 2013-12-07 NOTE — Assessment & Plan Note (Signed)
Her BP is adequately well controlled 

## 2013-12-07 NOTE — Assessment & Plan Note (Signed)
She will try tramadol for the pain

## 2013-12-07 NOTE — Assessment & Plan Note (Signed)
Will start treating with fetzima

## 2013-12-07 NOTE — Telephone Encounter (Signed)
I called pt and left a message for her to restart the K+ replacement and that a CT chest has been ordered

## 2013-12-07 NOTE — Progress Notes (Signed)
Pre visit review using our clinic review tool, if applicable. No additional management support is needed unless otherwise documented below in the visit note. 

## 2013-12-07 NOTE — Assessment & Plan Note (Signed)
Her K+ is low I have asked her to restart the K+ replacement therapy

## 2013-12-07 NOTE — Telephone Encounter (Signed)
Sent note to Dr. Ronnald Ramp and gave him a verbal informing him of this critical result

## 2013-12-07 NOTE — Telephone Encounter (Signed)
Call report CXR - Soft tissue mass lesion projecting over the aortic knob in the left upper lobe. 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

## 2013-12-08 LAB — DRUGS OF ABUSE SCREEN W/O ALC, ROUTINE URINE
Amphetamine Screen, Ur: NEGATIVE
Barbiturate Quant, Ur: NEGATIVE
Benzodiazepines.: NEGATIVE
COCAINE METABOLITES: NEGATIVE
Creatinine,U: 91.4 mg/dL
Marijuana Metabolite: NEGATIVE
Methadone: NEGATIVE
Opiate Screen, Urine: NEGATIVE
PROPOXYPHENE: NEGATIVE
Phencyclidine (PCP): NEGATIVE

## 2013-12-09 ENCOUNTER — Other Ambulatory Visit: Payer: Self-pay | Admitting: Internal Medicine

## 2013-12-09 ENCOUNTER — Ambulatory Visit (INDEPENDENT_AMBULATORY_CARE_PROVIDER_SITE_OTHER)
Admission: RE | Admit: 2013-12-09 | Discharge: 2013-12-09 | Disposition: A | Discharge status: Home / Self Care | Payer: Medicare Other | Source: Ambulatory Visit | Attending: Internal Medicine | Admitting: Internal Medicine

## 2013-12-09 DIAGNOSIS — J984 Other disorders of lung: Secondary | ICD-10-CM

## 2013-12-09 DIAGNOSIS — R918 Other nonspecific abnormal finding of lung field: Secondary | ICD-10-CM

## 2013-12-09 LAB — SPEP & IFE WITH QIG
Albumin ELP: 39.4 % — ABNORMAL LOW (ref 55.8–66.1)
Alpha-1-Globulin: 11.1 % — ABNORMAL HIGH (ref 2.9–4.9)
Alpha-2-Globulin: 11.5 % (ref 7.1–11.8)
Beta 2: 5.5 % (ref 3.2–6.5)
Beta Globulin: 6.2 % (ref 4.7–7.2)
GAMMA GLOBULIN: 26.3 % — AB (ref 11.1–18.8)
IGG (IMMUNOGLOBIN G), SERUM: 1970 mg/dL — AB (ref 690–1700)
IGM, SERUM: 68 mg/dL (ref 52–322)
IgA: 287 mg/dL (ref 69–380)
TOTAL PROTEIN, SERUM ELECTROPHOR: 7.4 g/dL (ref 6.0–8.3)

## 2013-12-09 MED ORDER — IOHEXOL 300 MG/ML  SOLN: 80.0000 mL | Freq: Once | INTRAMUSCULAR | Status: AC | PRN

## 2013-12-12 ENCOUNTER — Emergency Department (HOSPITAL_COMMUNITY): Payer: Medicare Other

## 2013-12-12 ENCOUNTER — Emergency Department (HOSPITAL_COMMUNITY)
Admission: EM | Admit: 2013-12-12 | Discharge: 2013-12-12 | Disposition: A | Discharge status: Home / Self Care | Payer: Medicare Other | Attending: Emergency Medicine | Admitting: Emergency Medicine

## 2013-12-12 ENCOUNTER — Encounter (HOSPITAL_COMMUNITY): Payer: Self-pay | Admitting: Emergency Medicine

## 2013-12-12 DIAGNOSIS — R109 Unspecified abdominal pain: Secondary | ICD-10-CM | POA: Insufficient documentation

## 2013-12-12 DIAGNOSIS — Z8739 Personal history of other diseases of the musculoskeletal system and connective tissue: Secondary | ICD-10-CM | POA: Insufficient documentation

## 2013-12-12 DIAGNOSIS — Z79899 Other long term (current) drug therapy: Secondary | ICD-10-CM | POA: Diagnosis not present

## 2013-12-12 DIAGNOSIS — R1084 Generalized abdominal pain: Secondary | ICD-10-CM | POA: Insufficient documentation

## 2013-12-12 DIAGNOSIS — I10 Essential (primary) hypertension: Secondary | ICD-10-CM | POA: Diagnosis not present

## 2013-12-12 DIAGNOSIS — J449 Chronic obstructive pulmonary disease, unspecified: Secondary | ICD-10-CM | POA: Insufficient documentation

## 2013-12-12 DIAGNOSIS — Z87891 Personal history of nicotine dependence: Secondary | ICD-10-CM | POA: Diagnosis not present

## 2013-12-12 DIAGNOSIS — IMO0002 Reserved for concepts with insufficient information to code with codable children: Secondary | ICD-10-CM | POA: Insufficient documentation

## 2013-12-12 DIAGNOSIS — J4489 Other specified chronic obstructive pulmonary disease: Secondary | ICD-10-CM | POA: Insufficient documentation

## 2013-12-12 DIAGNOSIS — K219 Gastro-esophageal reflux disease without esophagitis: Secondary | ICD-10-CM | POA: Diagnosis not present

## 2013-12-12 MED ORDER — IOHEXOL 300 MG/ML  SOLN
100.0000 mL | Freq: Once | INTRAMUSCULAR | Status: AC | PRN
  Administered 2013-12-12: 100 mL via INTRAVENOUS

## 2013-12-12 MED ORDER — IOHEXOL 300 MG/ML  SOLN
50.0000 mL | Freq: Once | INTRAMUSCULAR | Status: AC | PRN
  Administered 2013-12-12: 50 mL via ORAL

## 2013-12-12 NOTE — ED Notes (Addendum)
Pt complains of generalized abd pain that began this am at 0100. Began to throw up at 10pm yesterday. No diarrhrea. Pt states she ate potato soup yesterday and thinks it could be related to that.

## 2013-12-12 NOTE — ED Provider Notes (Signed)
CSN: [id number]     Arrival date & time 12/12/13  0844 History   First MD Initiated Contact with Patient 12/12/13 [phone]     Chief Complaint  Patient presents with  . Abdominal Pain      HPI Patient presents with generalized abdominal pain (1:00 this morning.  She vomited last night.  No diarrhea.  Patient recently been diagnosed with a probable lung mass by her primary care Dr.  She is referred to see pulmonologist this coming Friday.  Patient denies fever or chills. Past Medical History  Diagnosis Date  . COPD (chronic obstructive pulmonary disease)   . GERD (gastroesophageal reflux disease)   . Hypertension   . Osteoarthritis   . Lung nodules    Past Surgical History  Procedure Laterality Date  . Abdominal hysterectomy     Family History  Problem Relation Age of Onset  . Hyperlipidemia Other   . Cancer Neg Hx   . Stroke Neg Hx   . Heart disease Neg Hx    History  Substance Use Topics  . Smoking status: Former Smoker -- 2.00 packs/day for 25 years    Types: Cigarettes    Quit date: 09/03/1996  . Smokeless tobacco: Never Used  . Alcohol Use: No     Comment: beer 2-3 times a week   OB History   Grav Para Term Preterm Abortions TAB SAB Ect Mult Living                 Review of Systems  All other systems reviewed and are negative  Allergies  Ibuprofen  Home Medications   Prior to Admission medications   Medication Sig Start Date End Date Taking? Authorizing Provider  atorvastatin (LIPITOR) 40 MG tablet Take 1 tablet (40 mg total) by mouth daily. 09/28/13  Yes Janith Lima, MD  fexofenadine (ALLEGRA) 180 MG tablet Take 180 mg by mouth daily.   Yes Historical Provider, MD  Levomilnacipran HCl ER (FETZIMA) 40 MG CP24 Take 1 capsule by mouth daily. 12/07/13  Yes Janith Lima, MD  losartan-hydrochlorothiazide (HYZAAR) 50-12.5 MG per tablet Take 1 tablet by mouth daily.   Yes Historical Provider, MD  Potassium Chloride CR (MICRO-K) 8 MEQ CPCR capsule CR Take 8 mEq by  mouth 2 (two) times daily.   Yes Historical Provider, MD  ranitidine (ZANTAC) 150 MG tablet Take 150 mg by mouth daily.   Yes Historical Provider, MD  Umeclidinium-Vilanterol (ANORO ELLIPTA) 62.5-25 MCG/INH AEPB Inhale 1 puff into the lungs daily. 09/28/13  Yes Janith Lima, MD  HYDROcodone-acetaminophen (NORCO/VICODIN) 5-325 MG per tablet Take 1-2 tablets by mouth every 4 (four) hours as needed for moderate pain or severe pain. 12/07/13   Janith Lima, MD  mometasone (NASONEX) 50 MCG/ACT nasal spray Place 2 sprays into the nose daily. 09/03/11 06/29/13  Janith Lima, MD   BP 143/93  Pulse 62  Temp(Src) 98.8 F (37.1 C) (Oral)  Resp 17  SpO2 96% Physical Exam Physical Exam  Nursing note and vitals reviewed. Constitutional: She is oriented to person, place, and time. She appears well-developed and well-nourished. No distress.  HENT:  Head: Normocephalic and atraumatic.  Eyes: Pupils are equal, round, and reactive to light.  Neck: Normal range of motion.  Cardiovascular: Normal rate and intact distal pulses.   Pulmonary/Chest: No respiratory distress.  Abdominal: Normal appearance.  Mild tenderness throughout all quadrants to palpation.  No rebound or guarding tenderness.  No signs of peritoneal irritation.  No masses  noted.  Musculoskeletal: Normal range of motion.  Neurological: She is alert and oriented to person, place, and time. No cranial nerve deficit.  Skin: Skin is warm and dry. No rash noted.  Psychiatric: She has a normal mood and affect. Her behavior is normal.   ED Course  Procedures (including critical care time) Labs Review Labs Reviewed - No data to display  Imaging Review Ct Abdomen Pelvis W Contrast  12/12/2013   CLINICAL DATA:  Recently demonstrated left upper lobe mass suspicious for lung carcinoma. Probable right renal cyst on the CT. This was difficult to assess due to the fact that the patient refused intravenous contrast at that time.  EXAM: CT ABDOMEN AND  PELVIS WITH CONTRAST  TECHNIQUE: Multidetector CT imaging of the abdomen and pelvis was performed using the standard protocol following bolus administration of intravenous contrast.  CONTRAST:  21mL OMNIPAQUE IOHEXOL 300 MG/ML SOLN, 187mL OMNIPAQUE IOHEXOL 300 MG/ML SOLN  COMPARISON:  Chest CT dated 12/09/2013.  FINDINGS: Diffuse low density of the liver relative to the spleen. Interval small amount of free peritoneal fluid adjacent to the liver. 4.7 cm simple appearing upper pole right renal cyst. Small left renal cyst. Calcified peripancreatic and portacaval lymph nodes which are otherwise predominantly low density. The portacaval node has a short axis diameter of 8.5 mm on image 10. Previously noted calcified lower mediastinal lymph nodes. No significantly enlarged lymph nodes are seen.  Normal appearing spleen, pancreas, gallbladder, adrenal glands and urinary bladder. Surgically absent uterus. No adnexal masses. Multiple colonic diverticula without evidence of diverticulitis. Small left inguinal hernia containing fat. Oval sclerotic lesion in the left iliac bone, compatible with a bone island. Previously described small nodules at both lung bases.  IMPRESSION: 1. No evidence of metastatic disease in the abdomen or pelvis. 2. Previous granulomatous infection with associated calcified lymph nodes. 3. Extensive colonic diverticulosis. 4. Previously described bilateral lung nodules. These could represent metastases or small noncalcified granulomata. 5. Small left inguinal hernia containing fat.   Electronically Signed   By: Enrique Sack M.D.   On: 12/12/2013 11:39     EKG Interpretation None      MDM   Final diagnoses:  Generalized abdominal pain        Dot Lanes, MD 12/12/13 1224

## 2013-12-12 NOTE — Discharge Instructions (Signed)
Abdominal Pain, Women °Abdominal (stomach, pelvic, or belly) pain can be caused by many things. It is important to tell your doctor: °· The location of the pain. °· Does it come and go or is it present all the time? °· Are there things that start the pain (eating certain foods, exercise)? °· Are there other symptoms associated with the pain (fever, nausea, vomiting, diarrhea)? °All of this is helpful to know when trying to find the cause of the pain. °CAUSES  °· Stomach: virus or bacteria infection, or ulcer. °· Intestine: appendicitis (inflamed appendix), regional ileitis (Crohn's disease), ulcerative colitis (inflamed colon), irritable bowel syndrome, diverticulitis (inflamed diverticulum of the colon), or cancer of the stomach or intestine. °· Gallbladder disease or stones in the gallbladder. °· Kidney disease, kidney stones, or infection. °· Pancreas infection or cancer. °· Fibromyalgia (pain disorder). °· Diseases of the female organs: °¨ Uterus: fibroid (non-cancerous) tumors or infection. °¨ Fallopian tubes: infection or tubal pregnancy. °¨ Ovary: cysts or tumors. °¨ Pelvic adhesions (scar tissue). °¨ Endometriosis (uterus lining tissue growing in the pelvis and on the pelvic organs). °¨ Pelvic congestion syndrome (female organs filling up with blood just before the menstrual period). °¨ Pain with the menstrual period. °¨ Pain with ovulation (producing an egg). °¨ Pain with an IUD (intrauterine device, birth control) in the uterus. °¨ Cancer of the female organs. °· Functional pain (pain not caused by a disease, may improve without treatment). °· Psychological pain. °· Depression. °DIAGNOSIS  °Your doctor will decide the seriousness of your pain by doing an examination. °· Blood tests. °· X-rays. °· Ultrasound. °· CT scan (computed tomography, special type of X-ray). °· MRI (magnetic resonance imaging). °· Cultures, for infection. °· Barium enema (dye inserted in the large intestine, to better view it with  X-rays). °· Colonoscopy (looking in intestine with a lighted tube). °· Laparoscopy (minor surgery, looking in abdomen with a lighted tube). °· Major abdominal exploratory surgery (looking in abdomen with a large incision). °TREATMENT  °The treatment will depend on the cause of the pain.  °· Many cases can be observed and treated at home. °· Over-the-counter medicines recommended by your caregiver. °· Prescription medicine. °· Antibiotics, for infection. °· Birth control pills, for painful periods or for ovulation pain. °· Hormone treatment, for endometriosis. °· Nerve blocking injections. °· Physical therapy. °· Antidepressants. °· Counseling with a psychologist or psychiatrist. °· Minor or major surgery. °HOME CARE INSTRUCTIONS  °· Do not take laxatives, unless directed by your caregiver. °· Take over-the-counter pain medicine only if ordered by your caregiver. Do not take aspirin because it can cause an upset stomach or bleeding. °· Try a clear liquid diet (broth or water) as ordered by your caregiver. Slowly move to a bland diet, as tolerated, if the pain is related to the stomach or intestine. °· Have a thermometer and take your temperature several times a day, and record it. °· Bed rest and sleep, if it helps the pain. °· Avoid sexual intercourse, if it causes pain. °· Avoid stressful situations. °· Keep your follow-up appointments and tests, as your caregiver orders. °· If the pain does not go away with medicine or surgery, you may try: °¨ Acupuncture. °¨ Relaxation exercises (yoga, meditation). °¨ Group therapy. °¨ Counseling. °SEEK MEDICAL CARE IF:  °· You notice certain foods cause stomach pain. °· Your home care treatment is not helping your pain. °· You need stronger pain medicine. °· You want your IUD removed. °· You feel faint or   lightheaded. °· You develop nausea and vomiting. °· You develop a rash. °· You are having side effects or an allergy to your medicine. °SEEK IMMEDIATE MEDICAL CARE IF:  °· Your  pain does not go away or gets worse. °· You have a fever. °· Your pain is felt only in portions of the abdomen. The right side could possibly be appendicitis. The left lower portion of the abdomen could be colitis or diverticulitis. °· You are passing blood in your stools (bright red or black tarry stools, with or without vomiting). °· You have blood in your urine. °· You develop chills, with or without a fever. °· You pass out. °MAKE SURE YOU:  °· Understand these instructions. °· Will watch your condition. °· Will get help right away if you are not doing well or get worse. °Document Released: 01/07/2007 Document Revised: 07/27/2013 Document Reviewed: 01/27/2009 °[employer]® Patient Information ©2015 [employer]. This information is not intended to replace advice given to you by your health care provider. Make sure you discuss any questions you have with your health care provider. ° °

## 2013-12-16 ENCOUNTER — Ambulatory Visit: Payer: Medicare Other | Admitting: Family Medicine

## 2013-12-16 DIAGNOSIS — Z0289 Encounter for other administrative examinations: Secondary | ICD-10-CM

## 2013-12-18 ENCOUNTER — Ambulatory Visit (INDEPENDENT_AMBULATORY_CARE_PROVIDER_SITE_OTHER): Payer: Medicare Other | Admitting: Internal Medicine

## 2013-12-18 ENCOUNTER — Encounter: Payer: Self-pay | Admitting: Internal Medicine

## 2013-12-18 VITALS — BP 130/80 | HR 100 | Temp 98.8°F | Ht 62.0 in | Wt 142.0 lb

## 2013-12-18 DIAGNOSIS — R918 Other nonspecific abnormal finding of lung field: Secondary | ICD-10-CM

## 2013-12-18 DIAGNOSIS — J449 Chronic obstructive pulmonary disease, unspecified: Secondary | ICD-10-CM

## 2013-12-18 DIAGNOSIS — J984 Other disorders of lung: Secondary | ICD-10-CM

## 2013-12-18 DIAGNOSIS — J4489 Other specified chronic obstructive pulmonary disease: Secondary | ICD-10-CM

## 2013-12-18 NOTE — Progress Notes (Signed)
Subjective:    Patient ID: Kelli Reyes, female    DOB: 20-Apr-1945   MRN: [id number]    Brief patient profile:  80 yobf quit smoking 1998 with onset of cough / sob in 1995 and not consistently better since so referred by Dr Ronnald Ramp 10/05/2011 to pulmonary clinic but lost to f/u until reconsulted 12/18/13 for lung mass.   10/05/2011 1st pulmonary eval cc chronic doe x over a decade min progressive x 2 -3 blocks some better since spiriva and mostly dry cough worse but improved on nasonex.   Typically wakes up about 3am nightly then takes spriva, does better p drinking water. No seasonal variability, h/o asthma or childhood allergies.  No  purulent sputum or sinus/hb symptoms on present rx. rec Continue prilosec 20 mg Take 30-60 min before first meal of the day and ADD Pepcid 20 mg one at bedtime GERD diet reviewed Please schedule a follow up office visit in 4- 6 weeks, call sooner if needed with PFT's .  11/16/2011 f/u ov/Kelli Reyes no show    12/18/2013 consultation/ Kelli Reyes / referred by Dr Alona Bene   Chief Complaint  Patient presents with  . Follow-up    Last seen in 2013. Pt states referred back by Dr Ronnald Ramp for eval of abnormal ct chest. She reports having increased wheezing and SOB recently. Gets out of breath walking approx 1 block.    indolent onset gradually worse sob x sev months/cough some better since started anoro,  No hemoptysis or purulent sputum  No obvious day to day or daytime variabilty or assoc chronic cough or cp or chest tightness, subjective wheeze overt sinus or hb symptoms. No unusual exp hx or h/o childhood pna/ asthma or knowledge of premature birth.  Sleeping ok without nocturnal  or early am exacerbation  of respiratory  c/o's or need for noct saba. Also denies any obvious fluctuation of symptoms with weather or environmental changes or other aggravating or alleviating factors except as outlined above   Current Medications, Allergies, Complete Past Medical History, Past  Surgical History, Family History, and Social History were reviewed in [employer] record.  ROS  The following are not active complaints unless bolded sore throat, dysphagia, dental problems, itching, sneezing,  nasal congestion or excess/ purulent secretions, ear ache,   fever, chills, sweats, unintended wt loss, pleuritic or exertional cp, hemoptysis,  orthopnea pnd or leg swelling, presyncope, palpitations, heartburn, abdominal pain, anorexia, nausea, vomiting, diarrhea  or change in bowel or urinary habits, change in stools or urine, dysuria,hematuria,  rash, arthralgias, visual complaints, headache, numbness weakness or ataxia or problems with walking or coordination,  change in mood/affect or memory.                     Objective:   Physical Exam  amb bf nad Wt 151 10/06/2011 >  12/18/13 142  HEENT mild turbinate edema.  Oropharynx no thrush or excess pnd or cobblestoning.  No JVD or cervical adenopathy. Min accessory muscle hypertrophy. Trachea midline, nl thryroid. Chest was mildly hyperinflated by percussion with mildly diminished breath sounds and moderate increased exp time without wheeze. Hoover sign positive at mid to late inspiration. Regular rate and rhythm without murmur gallop or rub or increase P2 or edema.  Abd: no hsm, nl excursion. Ext warm without cyanosis or clubbing.        12/09/13 CT s contrast 1. There is an abnormal soft tissue mass in the left upper lobe  extending to  the left hilum which is worrisome for primary pulmonary  malignancy.  2. There are stable findings consistent with previous granulomatous  infection. Calcified and noncalcified pulmonary nodules are stable.  3. There is no bulky hilar lymphadenopathy.  4. The partially imaged spleen appears larger than on the previous  study. There is stable hypodensity in the upper pole of the right  kidney most compatible with a cyst       Assessment & Plan:

## 2013-12-18 NOTE — Patient Instructions (Signed)
Come to outpatient registration at [facility] (behind the ER) at 715 am Thursday Oct 1  with nothing to eat or drink after midnight Wednesday.for Bronchoscopy with biopsy

## 2013-12-20 NOTE — Assessment & Plan Note (Signed)
Better on anoro > rec continue

## 2013-12-20 NOTE — Assessment & Plan Note (Signed)
Discussed in detail all the  indications, usual  risks and alternatives  relative to the benefits with patient who agrees to proceed with bronchoscopy with biopsy.   Comment:  May have a proximal obst lesion making the "mass" much more prominent on cxr/ ct due to post obst changes so first step is best fob then PET in that order (post osbt changes can appear metabolically active and confuse the interpretation for staging.

## 2013-12-24 ENCOUNTER — Encounter (HOSPITAL_COMMUNITY)
Admission: RE | Disposition: A | Discharge status: Home / Self Care | Payer: Self-pay | Source: Ambulatory Visit | Attending: Internal Medicine

## 2013-12-24 ENCOUNTER — Ambulatory Visit (HOSPITAL_COMMUNITY)
Admission: RE | Admit: 2013-12-24 | Discharge: 2013-12-24 | Disposition: A | Discharge status: Home / Self Care | Payer: Medicare Other | Source: Ambulatory Visit | Attending: Internal Medicine | Admitting: Internal Medicine

## 2013-12-24 ENCOUNTER — Encounter (HOSPITAL_COMMUNITY): Payer: Self-pay | Admitting: Respiratory Therapy

## 2013-12-24 DIAGNOSIS — J449 Chronic obstructive pulmonary disease, unspecified: Secondary | ICD-10-CM | POA: Diagnosis not present

## 2013-12-24 DIAGNOSIS — Z87891 Personal history of nicotine dependence: Secondary | ICD-10-CM | POA: Diagnosis not present

## 2013-12-24 DIAGNOSIS — R918 Other nonspecific abnormal finding of lung field: Secondary | ICD-10-CM

## 2013-12-24 DIAGNOSIS — J984 Other disorders of lung: Secondary | ICD-10-CM | POA: Insufficient documentation

## 2013-12-24 DIAGNOSIS — C3490 Malignant neoplasm of unspecified part of unspecified bronchus or lung: Secondary | ICD-10-CM | POA: Insufficient documentation

## 2013-12-24 DIAGNOSIS — K219 Gastro-esophageal reflux disease without esophagitis: Secondary | ICD-10-CM | POA: Insufficient documentation

## 2013-12-24 HISTORY — PX: VIDEO BRONCHOSCOPY: SHX5072

## 2013-12-24 SURGERY — BRONCHOSCOPY, WITH FLUOROSCOPY
Anesthesia: Moderate Sedation | Laterality: Bilateral

## 2013-12-24 MED ORDER — LIDOCAINE HCL 1 % IJ SOLN
INTRAMUSCULAR | Status: DC | PRN
  Administered 2013-12-24: 6 mL via RESPIRATORY_TRACT

## 2013-12-24 MED ORDER — PHENYLEPHRINE HCL 0.25 % NA SOLN
NASAL | Status: DC | PRN
  Administered 2013-12-24: 1 via NASAL

## 2013-12-24 MED ORDER — MIDAZOLAM HCL 10 MG/2ML IJ SOLN: 1.0000 mg | Freq: Once | INTRAMUSCULAR | Status: DC

## 2013-12-24 MED ORDER — LIDOCAINE HCL 2 % EX GEL: 1.0000 "application " | Freq: Once | CUTANEOUS | Status: DC

## 2013-12-24 MED ORDER — MEPERIDINE HCL 25 MG/ML IJ SOLN
INTRAMUSCULAR | Status: DC | PRN
  Administered 2013-12-24 (×2): 50 mg via INTRAVENOUS

## 2013-12-24 MED ORDER — MIDAZOLAM HCL 10 MG/2ML IJ SOLN
INTRAMUSCULAR | Status: DC | PRN
  Administered 2013-12-24 (×2): 2.5 mg via INTRAVENOUS

## 2013-12-24 MED ORDER — LIDOCAINE HCL 2 % EX GEL
CUTANEOUS | Status: DC | PRN
  Administered 2013-12-24: 1

## 2013-12-24 MED ORDER — ALBUTEROL SULFATE (2.5 MG/3ML) 0.083% IN NEBU
2.5000 mg | INHALATION_SOLUTION | Freq: Once | RESPIRATORY_TRACT | Status: AC
  Administered 2013-12-24: 2.5 mg via RESPIRATORY_TRACT

## 2013-12-24 MED ORDER — PHENYLEPHRINE HCL 0.25 % NA SOLN: 1.0000 | Freq: Four times a day (QID) | NASAL | Status: DC | PRN

## 2013-12-24 MED ORDER — MEPERIDINE HCL 100 MG/ML IJ SOLN: 100.0000 mg | Freq: Once | INTRAMUSCULAR | Status: DC

## 2013-12-24 MED ORDER — MIDAZOLAM HCL 10 MG/2ML IJ SOLN
INTRAMUSCULAR | Status: AC
  Filled 2013-12-24: qty 4

## 2013-12-24 MED ORDER — SODIUM CHLORIDE 0.9 % IV SOLN
INTRAVENOUS | Status: DC
  Administered 2013-12-24: 08:00:00 via INTRAVENOUS

## 2013-12-24 MED ORDER — MEPERIDINE HCL 100 MG/ML IJ SOLN
INTRAMUSCULAR | Status: AC
  Filled 2013-12-24: qty 2

## 2013-12-24 NOTE — Discharge Instructions (Signed)
Flexible Bronchoscopy, Care After These instructions give you information on caring for yourself after your procedure. Your doctor may also give you more specific instructions. Call your doctor if you have any problems or questions after your procedure. HOME CARE  Do not eat or drink anything for 2 hours after your procedure. If you try to eat or drink before the medicine wears off, food or drink could go into your lungs. You could also burn yourself.  After 2 hours have passed and when you can cough and gag normally, you may eat soft food and drink liquids slowly.  The day after the test, you may eat your normal diet.  You may do your normal activities.  Keep all doctor visits. GET HELP RIGHT AWAY IF:   You get more and more short of breath.  You get light-headed.  You feel like you are going to pass out (faint).  You have chest pain.  You have new problems that worry you.  You cough up more than a little blood.  You cough up more blood than before. MAKE SURE YOU:  Understand these instructions.  Will watch your condition.  Will get help right away if you are not doing well or get worse. Document Released: 01/07/2009 Document Revised: 03/17/2013 Document Reviewed: 11/14/2012 [facility] Patient Information 2015 [place], [place]. This information is not intended to replace advice given to you by your health care provider. Make sure you discuss any questions you have with your health care provider.  Nothing to eat or drink until    10:30 am today   12/24/2013

## 2013-12-24 NOTE — H&P (Signed)
Patient ID: Kelli Reyes, female    DOB: 14-Jun-1945   MRN: [id number]     Brief patient profile:  81 yobf quit smoking 1998 with onset of cough / sob in 1995 and not consistently better since so referred by Dr Ronnald Ramp 10/05/2011 to pulmonary clinic but lost to f/u until reconsulted 12/18/13 for lung mass.     10/05/2011 1st pulmonary eval cc chronic doe x over a decade min progressive x 2 -3 blocks some better since spiriva and mostly dry cough worse but improved on nasonex.  Typically wakes up about 3am nightly then takes spriva, does better p drinking water. No seasonal variability, h/o asthma or childhood allergies.  No  purulent sputum or sinus/hb symptoms on present rx. rec Continue prilosec 20 mg Take 30-60 min before first meal of the day and ADD Pepcid 20 mg one at bedtime GERD diet reviewed Please schedule a follow up office visit in 4- 6 weeks, call sooner if needed with PFT's .   11/16/2011 f/u ov/Kelli Reyes no show      12/18/2013 consultation/ Kelli Reyes / referred by Dr Alona Bene    Chief Complaint   Patient presents with   .  Follow-up       Last seen in 2013. Pt states referred back by Dr Ronnald Ramp for eval of abnormal ct chest. She reports having increased wheezing and SOB recently. Gets out of breath walking approx 1 block.      indolent onset gradually worse sob x sev months/cough some better since started anoro,  No hemoptysis or purulent sputum   No obvious day to day or daytime variabilty or assoc chronic cough or cp or chest tightness, subjective wheeze overt sinus or hb symptoms. No unusual exp hx or h/o childhood pna/ asthma or knowledge of premature birth.   Sleeping ok without nocturnal  or early am exacerbation  of respiratory  c/o's or need for noct saba. Also denies any obvious fluctuation of symptoms with weather or environmental changes or other aggravating or alleviating factors except as outlined above    Current Medications, Allergies, Complete Past Medical History,  Past Surgical History, Family History, and Social History were reviewed in [employer] record.   ROS  The following are not active complaints unless bolded sore throat, dysphagia, dental problems, itching, sneezing,  nasal congestion or excess/ purulent secretions, ear ache,   fever, chills, sweats, unintended wt loss, pleuritic or exertional cp, hemoptysis,  orthopnea pnd or leg swelling, presyncope, palpitations, heartburn, abdominal pain, anorexia, nausea, vomiting, diarrhea  or change in bowel or urinary habits, change in stools or urine, dysuria,hematuria,  rash, arthralgias, visual complaints, headache, numbness weakness or ataxia or problems with walking or coordination,  change in mood/affect or memory.                            Objective:     Physical Exam   amb bf nad Wt 151 10/06/2011 >  12/18/13 142  HEENT mild turbinate edema.  Oropharynx no thrush or excess pnd or cobblestoning.  No JVD or cervical adenopathy. Min accessory muscle hypertrophy. Trachea midline, nl thryroid. Chest was mildly hyperinflated by percussion with mildly diminished breath sounds and moderate increased exp time without wheeze. Hoover sign positive at mid to late inspiration. Regular rate and rhythm without murmur gallop or rub or increase P2 or edema.  Abd: no hsm, nl excursion. Ext warm without cyanosis or clubbing.  12/09/13 CT s contrast 1. There is an abnormal soft tissue mass in the left upper lobe   extending to the left hilum which is worrisome for primary pulmonary   malignancy.   2. There are stable findings consistent with previous granulomatous   infection. Calcified and noncalcified pulmonary nodules are stable.   3. There is no bulky hilar lymphadenopathy.   4. The partially imaged spleen appears larger than on the previous   study. There is stable hypodensity in the upper pole of the right   kidney most compatible with a cyst            Assessment & Plan:     Lung mass -   Discussed in detail all the  indications, usual  risks and alternatives  relative to the benefits with patient who agrees to proceed with bronchoscopy with biopsy.  Comment:  May have a proximal obst lesion making the "mass" much more prominent on cxr/ ct due to post obst changes so first step is best fob then PET in that order (post osbt changes can appear metabolically active and confuse the interpretation for staging.           COPD GOLD III -    Better on anoro > rec continue       12/24/2013 FOB day no new complaints, no change on exam   Christinia Gully, MD Pulmonary and [place] [phone] After 5:30 PM or weekends, call [phone]

## 2013-12-24 NOTE — Op Note (Signed)
Bronchoscopy Procedure Note  Date of Operation: 12/24/2013   Pre-op Diagnosis: lung ca   Post-op Diagnosis: same  Surgeon: Christinia Gully  Anesthesia: Monitored Local Anesthesia with Sedation  Operation: Video Flexible fiberoptic bronchoscopy, diagnostic   Findings: severe swelling LUL post segment orifice  Specimen: washing LUL/ endobronchial bx x 5 LUL orifice  Estimated Blood Loss: min  Complications: min bleeding controlled with Epi x one  Indications and History: See updated H and P same date. The risks, benefits, complications, treatment options and expected outcomes were discussed with the patient.  The possibilities of reaction to medication, pulmonary aspiration, perforation of a viscus, bleeding, failure to diagnose a condition and creating a complication requiring transfusion or operation were discussed with the patient who freely signed the consent.    Description of Procedure: The patient was re-examined in the bronchoscopy suite and the site of surgery properly noted/marked.  The patient was identified  and the procedure verified as Flexible Fiberoptic Bronchoscopy.  A Time Out was held and the above information confirmed.   After the induction of topical nasopharyngeal anesthesia, the patient was positioned  and the bronchoscope was passed through the R naris. The vocal cords were visualized and  1% buffered lidocaine 5 ml was topically placed onto the cords. The cords were nl. The scope was then passed into the trachea.  1% buffered lidocaine given topically. Airways inspected bilaterally to the subsegmental level with the following findings:  All airways nl to subsegmental level except for LUL Post segment which was 75% obst with severe swelling in air divider between it and the other orifices of the LUL  Interventions Lavage for cyt Endobronchial bx x 5 for histology      The Patient was taken to the Endoscopy Recovery area in satisfactory  condition.  Attestation: I performed the procedure.  Christinia Gully, MD Pulmonary and [place] [phone] After 5:30 PM or weekends, call [phone]

## 2013-12-24 NOTE — Progress Notes (Signed)
Video Bronchoscopy done  Intervention bronchial washing Intervention bronchial biopsy  Procedure tolerated well

## 2013-12-25 ENCOUNTER — Encounter (HOSPITAL_COMMUNITY): Payer: Self-pay | Admitting: Internal Medicine

## 2013-12-25 ENCOUNTER — Telehealth: Payer: Self-pay | Admitting: Internal Medicine

## 2013-12-25 MED ORDER — PREDNISONE 10 MG PO TABS: ORAL_TABLET | ORAL | Status: DC

## 2013-12-25 NOTE — Telephone Encounter (Signed)
Spoke with the pt and scheduled her for 2 wk rov with MW and also sent in pred taper

## 2013-12-25 NOTE — Progress Notes (Signed)
Quick Note:  See phone note dated 12/25/13 ______

## 2013-12-26 ENCOUNTER — Ambulatory Visit (INDEPENDENT_AMBULATORY_CARE_PROVIDER_SITE_OTHER): Payer: Medicare Other | Admitting: Family Medicine

## 2013-12-26 VITALS — BP 115/76 | HR 90 | Temp 98.3°F | Resp 16 | Ht 64.0 in | Wt 140.2 lb

## 2013-12-26 DIAGNOSIS — L309 Dermatitis, unspecified: Secondary | ICD-10-CM

## 2013-12-26 DIAGNOSIS — R21 Rash and other nonspecific skin eruption: Secondary | ICD-10-CM

## 2013-12-26 MED ORDER — TRIAMCINOLONE ACETONIDE 0.1 % EX CREA: 1.0000 "application " | TOPICAL_CREAM | Freq: Two times a day (BID) | CUTANEOUS | Status: DC | PRN

## 2013-12-26 NOTE — Patient Instructions (Signed)
Eczema Eczema, also called atopic dermatitis, is a skin disorder that causes inflammation of the skin. It causes a red rash and dry, scaly skin. The skin becomes very itchy. Eczema is generally worse during the cooler winter months and often improves with the warmth of summer. Eczema usually starts showing signs in infancy. Some children outgrow eczema, but it may last through adulthood.  CAUSES  The exact cause of eczema is not known, but it appears to run in families. People with eczema often have a family history of eczema, allergies, asthma, or hay fever. Eczema is not contagious. Flare-ups of the condition may be caused by:   Contact with something you are sensitive or allergic to.   Stress. SIGNS AND SYMPTOMS  Dry, scaly skin.   Red, itchy rash.   Itchiness. This may occur before the skin rash and may be very intense.  DIAGNOSIS  The diagnosis of eczema is usually made based on symptoms and medical history. TREATMENT  Eczema cannot be cured, but symptoms usually can be controlled with treatment and other strategies. A treatment plan might include:  Controlling the itching and scratching.   Use over-the-counter antihistamines as directed for itching. This is especially useful at night when the itching tends to be worse.   Use over-the-counter steroid creams as directed for itching.   Avoid scratching. Scratching makes the rash and itching worse. It may also result in a skin infection (impetigo) due to a break in the skin caused by scratching.   Keeping the skin well moisturized with creams every day. This will seal in moisture and help prevent dryness. Lotions that contain alcohol and water should be avoided because they can dry the skin.   Limiting exposure to things that you are sensitive or allergic to (allergens).   Recognizing situations that cause stress.   Developing a plan to manage stress.  HOME CARE INSTRUCTIONS   Only take over-the-counter or  prescription medicines as directed by your health care provider.   Do not use anything on the skin without checking with your health care provider.   Keep baths or showers short (5 minutes) in warm (not hot) water. Use mild cleansers for bathing. These should be unscented. You may add nonperfumed bath oil to the bath water. It is best to avoid soap and bubble bath.   Immediately after a bath or shower, when the skin is still damp, apply a moisturizing ointment to the entire body. This ointment should be a petroleum ointment. This will seal in moisture and help prevent dryness. The thicker the ointment, the better. These should be unscented.   Keep fingernails cut short. Children with eczema may need to wear soft gloves or mittens at night after applying an ointment.   Dress in clothes made of cotton or cotton blends. Dress lightly, because heat increases itching.   A child with eczema should stay away from anyone with fever blisters or cold sores. The virus that causes fever blisters (herpes simplex) can cause a serious skin infection in children with eczema. SEEK MEDICAL CARE IF:   Your itching interferes with sleep.   Your rash gets worse or is not better within 1 week after starting treatment.   You see pus or soft yellow scabs in the rash area.   You have a fever.   You have a rash flare-up after contact with someone who has fever blisters.  Document Released: 03/09/2000 Document Revised: 12/31/2012 Document Reviewed: 10/13/2012 ExitCare Patient Information 2015 [employer]. This information   is not intended to replace advice given to you by your health care provider. Make sure you discuss any questions you have with your health care provider.  

## 2013-12-26 NOTE — Progress Notes (Signed)
Chief Complaint:  Chief Complaint  Patient presents with  . Rash    x1 month; ongoing rash; pt states it comes and goes; she has tried various OTC creams but nothing is helping; pt states it itches a lot; pt states the rash is located on her back and on inner thighs    HPI: Kelli Reyes is a 68 y.o. female who is here for  1 month  Hx of rash on both groin area, in between her vertical hysterectomy scar, and on her back. It tiches so she scratches.  She denies any new meds, no new travels, no new foods.  has tried numerous otc creams and calimine Has not tried benadryl. She is currently on a steroid taper.   Of pertinent interest she was recently in the hospital for a diagnostic bronchoscopy by Dr Melvyn Novas for a cavitating lung mass worrisome for malignancy, former smoker.  CT scan showed the following:  12/09/13 CT s contrast  1. There is an abnormal soft tissue mass in the left upper lobe  extending to the left hilum which is worrisome for primary pulmonary  malignancy.  2. There are stable findings consistent with previous granulomatous  infection. Calcified and noncalcified pulmonary nodules are stable.  3. There is no bulky hilar lymphadenopathy.  4. The partially imaged spleen appears larger than on the previous  study. There is stable hypodensity in the upper pole of the right  kidney most compatible with a cyst  Past Medical History  Diagnosis Date  . COPD (chronic obstructive pulmonary disease)   . GERD (gastroesophageal reflux disease)   . Hypertension   . Osteoarthritis   . Lung nodules    Past Surgical History  Procedure Laterality Date  . Abdominal hysterectomy    . Video bronchoscopy Bilateral 12/24/2013    Procedure: VIDEO BRONCHOSCOPY WITH FLUORO;  Surgeon: Tanda Rockers, MD;  Location: [facility];  Service: Cardiopulmonary;  Laterality: Bilateral;   History   Social History  . Marital Status: Widowed    Spouse Name: N/A    Number of Children: 4    . Years of Education: N/A   Occupational History  . school bus driver     [place] History Main Topics  . Smoking status: Former Smoker -- 2.00 packs/day for 25 years    Types: Cigarettes    Quit date: 09/03/1996  . Smokeless tobacco: Never Used  . Alcohol Use: No     Comment: beer 2-3 times a week  . Drug Use: No  . Sexual Activity: Not Currently   Other Topics Concern  . None   Social History Narrative   No regular exercise         Family History  Problem Relation Age of Onset  . Hyperlipidemia Other   . Cancer Neg Hx   . Stroke Neg Hx   . Heart disease Neg Hx    Allergies  Allergen Reactions  . Ibuprofen     REACTION: rash   Prior to Admission medications   Medication Sig Start Date End Date Taking? Authorizing Provider  losartan-hydrochlorothiazide (HYZAAR) 50-12.5 MG per tablet Take 1 tablet by mouth daily.   Yes Historical Provider, MD  predniSONE (DELTASONE) 10 MG tablet 4 x 2 days, 2 x 2 days, 1 x 2 days then stop 12/25/13  Yes Tanda Rockers, MD  Umeclidinium-Vilanterol [facility] ELLIPTA) 62.5-25 MCG/INH AEPB Inhale 1 puff into the lungs daily. 09/28/13  Yes Marcello Moores  Evalina Field, MD  atorvastatin (LIPITOR) 40 MG tablet Take 1 tablet (40 mg total) by mouth daily. 09/28/13   Janith Lima, MD  HYDROcodone-acetaminophen (NORCO/VICODIN) 5-325 MG per tablet Take 1-2 tablets by mouth every 4 (four) hours as needed for moderate pain or severe pain. 12/07/13   Janith Lima, MD  mometasone (NASONEX) 50 MCG/ACT nasal spray Place 2 sprays into the nose daily. 09/03/11 06/29/13  Janith Lima, MD  Potassium Chloride CR (MICRO-K) 8 MEQ CPCR capsule CR Take 8 mEq by mouth 2 (two) times daily.    Historical Provider, MD     ROS: The patient denies fevers, chills, night sweats, unintentional weight loss, chest pain, palpitations, wheezing, acute dyspnea on exertion, nausea, vomiting, abdominal pain, dysuria, hematuria, melena, numbness, weakness, or tingling.   All  other systems have been reviewed and were otherwise negative with the exception of those mentioned in the HPI and as above.    PHYSICAL EXAM: Filed Vitals:   12/26/13 1455  BP: 115/76  Pulse: 90  Temp: 98.3 F (36.8 C)  Resp: 16   Filed Vitals:   12/26/13 1455  Height: 5\' 4"  (1.626 m)  Weight: 140 lb 3.2 oz (63.594 kg)   Body mass index is 24.05 kg/(m^2).  General: Alert, no acute distress HEENT:  Normocephalic, atraumatic, oropharynx patent. EOMI, PERRLA Cardiovascular:  Regular rate and rhythm, no rubs murmurs or gallops.  No Carotid bruits, radial pulse intact. No pedal edema.  Respiratory: Clear to auscultation bilaterally.  No wheezes, rales, or rhonchi.  No cyanosis, no use of accessory musculature GI: No organomegaly, abdomen is soft and non-tender, positive bowel sounds.  No masses. Skin: + eczematous rash on bilateral groin, back , nothing between her c sxn scar. She has dry skin  Neurologic: Facial musculature symmetric. Psychiatric: Patient is appropriate throughout our interaction. Lymphatic: No cervical lymphadenopathy Musculoskeletal: Gait intact.   LABS:    EKG/XRAY:   Primary read interpreted by Dr. Marin Comment at [facility].   ASSESSMENT/PLAN: Encounter Diagnoses  Name Primary?  . Eczema Yes  . Rash and nonspecific skin eruption    Rx triamcinolone prn, moisturize  F/u prn IF no improve,ment in 2 weeks may call in Lidex She is currently on steroids from DR Lorane Gell sideeffects, risk and benefits, and alternatives of medications d/w patient. Patient is aware that all medications have potential sideeffects and we are unable to predict every sideeffect or drug-drug interaction that may occur.  Kaleth Koy, [place], DO 12/26/2013 3:12 PM

## 2013-12-29 ENCOUNTER — Ambulatory Visit: Payer: Medicare Other | Admitting: Internal Medicine

## 2014-01-06 ENCOUNTER — Ambulatory Visit: Payer: Medicare Other | Admitting: Internal Medicine

## 2014-01-07 ENCOUNTER — Ambulatory Visit (INDEPENDENT_AMBULATORY_CARE_PROVIDER_SITE_OTHER)
Admission: RE | Admit: 2014-01-07 | Discharge: 2014-01-07 | Disposition: A | Discharge status: Home / Self Care | Payer: Medicare Other | Source: Ambulatory Visit | Attending: Internal Medicine | Admitting: Internal Medicine

## 2014-01-07 ENCOUNTER — Encounter: Payer: Self-pay | Admitting: Internal Medicine

## 2014-01-07 ENCOUNTER — Ambulatory Visit (INDEPENDENT_AMBULATORY_CARE_PROVIDER_SITE_OTHER): Payer: Medicare Other | Admitting: Internal Medicine

## 2014-01-07 VITALS — BP 116/82 | HR 99 | Temp 98.0°F | Ht 62.0 in | Wt 142.0 lb

## 2014-01-07 DIAGNOSIS — R918 Other nonspecific abnormal finding of lung field: Secondary | ICD-10-CM

## 2014-01-07 DIAGNOSIS — J449 Chronic obstructive pulmonary disease, unspecified: Secondary | ICD-10-CM

## 2014-01-07 MED ORDER — PREDNISONE 10 MG PO TABS: ORAL_TABLET | ORAL | Status: DC

## 2014-01-07 NOTE — Assessment & Plan Note (Addendum)
-   PFT's 10/05/11 FEV1  0.94 (46%) ratio 52 and 29% better p B2  Not a good surgical candidate at all at this point as would need LULobectomy at a minimum and has very little reserve  rec continue anoro each am and repeat spirometry on return to see if improved enough to contemplate surgery if needed

## 2014-01-07 NOTE — Assessment & Plan Note (Signed)
-   See CT chest  12/12/13 c/w LUL mass/atx - FOB 12/24/13  >   swelling LUL orifice > bx and cyt all neg > pred x 6 days symptoms better and cxr less prominent "mass" 01/07/2014 so rec prednisone x 12 days and f/u in 2 weeks for ? Repeat fob

## 2014-01-07 NOTE — Patient Instructions (Addendum)
Continue anoro each am   Prednisone Take 4 for three days 3 for three days 2 for three days 1 for three days and stop   Please schedule a follow up office visit in   weeks, sooner if needed  Late add: with cxr and spirometry on return and consider repeat fob

## 2014-01-07 NOTE — Progress Notes (Signed)
Subjective:    Patient ID: Kelli Reyes, female    DOB: June 12, 1945   MRN: [id number]    Brief patient profile:  32 yobf quit smoking 1998 with onset of cough / sob in 1995 and not consistently better since so referred by Dr Ronnald Ramp 10/05/2011 to pulmonary clinic but lost to f/u until reconsulted 12/18/13 for lung mass.   10/05/2011 1st pulmonary eval cc chronic doe x over a decade min progressive x 2 -3 blocks some better since spiriva and mostly dry cough worse but improved on nasonex.   Typically wakes up about 3am nightly then takes spriva, does better p drinking water. No seasonal variability, h/o asthma or childhood allergies.  No  purulent sputum or sinus/hb symptoms on present rx. rec Continue prilosec 20 mg Take 30-60 min before first meal of the day and ADD Pepcid 20 mg one at bedtime GERD diet reviewed Please schedule a follow up office visit in 4- 6 weeks, call sooner if needed with PFT's .  11/16/2011 f/u ov/Elliotte Marsalis no show    12/18/2013 consultation/ Laurence Folz / referred by Dr Alona Bene   Chief Complaint  Patient presents with  . Follow-up    Last seen in 2013. Pt states referred back by Dr Ronnald Ramp for eval of abnormal ct chest. She reports having increased wheezing and SOB recently. Gets out of breath walking approx 1 block.    indolent onset gradually worse sob x sev months/cough some better since started anoro,  No hemoptysis or purulent sputum rec   Thursday Oct 1  with   Bronchoscopy with biopsy> endobronchial swelling LUL > nl mucosa/ neg cytology on bal > rec 6 days of pred then return to regroup   01/07/2014 f/u ov/Tniyah Nakagawa re:  Chief Complaint  Patient presents with  . Follow-up    Pt reports her breathing has improved some, but not back to normal baseline. No new co's today.     Transient improvement while on prednisone, then worse despite continuing anoro   No obvious day to day or daytime variabilty or assoc excess or purulent or bloody mucus  or cp or chest tightness,  subjective wheeze overt sinus or hb symptoms. No unusual exp hx or h/o childhood pna/ asthma or knowledge of premature birth.  Sleeping ok without nocturnal  or early am exacerbation  of respiratory  c/o's or need for noct saba. Also denies any obvious fluctuation of symptoms with weather or environmental changes or other aggravating or alleviating factors except as outlined above   Current Medications, Allergies, Complete Past Medical History, Past Surgical History, Family History, and Social History were reviewed in [employer] record.  ROS  The following are not active complaints unless bolded sore throat, dysphagia, dental problems, itching, sneezing,  nasal congestion or excess/ purulent secretions, ear ache,   fever, chills, sweats, unintended wt loss, pleuritic or exertional cp, hemoptysis,  orthopnea pnd or leg swelling, presyncope, palpitations, heartburn, abdominal pain, anorexia, nausea, vomiting, diarrhea  or change in bowel or urinary habits, change in stools or urine, dysuria,hematuria,  rash, arthralgias, visual complaints, headache, numbness weakness or ataxia or problems with walking or coordination,  change in mood/affect or memory.                     Objective:   Physical Exam  amb bf nad  Wt 151 10/06/2011 >  12/18/13 142 > 01/07/2014 142   HEENT mild turbinate edema.  Oropharynx no thrush or excess pnd or  cobblestoning.  No JVD or cervical adenopathy. Min accessory muscle hypertrophy. Trachea midline, nl thryroid. Chest was mildly hyperinflated by percussion with mildly diminished breath sounds and moderate increased exp time without wheeze. Hoover sign positive at mid to late inspiration. Regular rate and rhythm without murmur gallop or rub or increase P2 or edema.  Abd: no hsm, nl excursion. Ext warm without cyanosis or clubbing.        12/09/13 CT s contrast 1. There is an abnormal soft tissue mass in the left upper lobe  extending to the  left hilum which is worrisome for primary pulmonary  malignancy.  2. There are stable findings consistent with previous granulomatous  infection. Calcified and noncalcified pulmonary nodules are stable.  3. There is no bulky hilar lymphadenopathy.  4. The partially imaged spleen appears larger than on the previous  study. There is stable hypodensity in the upper pole of the right  kidney most compatible with a cyst       Assessment & Plan:

## 2014-01-08 ENCOUNTER — Other Ambulatory Visit (INDEPENDENT_AMBULATORY_CARE_PROVIDER_SITE_OTHER): Payer: Medicare Other

## 2014-01-08 ENCOUNTER — Ambulatory Visit (INDEPENDENT_AMBULATORY_CARE_PROVIDER_SITE_OTHER): Payer: Medicare Other | Admitting: Internal Medicine

## 2014-01-08 ENCOUNTER — Encounter: Payer: Self-pay | Admitting: Internal Medicine

## 2014-01-08 VITALS — BP 130/72 | HR 92 | Temp 97.8°F | Resp 16 | Ht 62.0 in | Wt 144.0 lb

## 2014-01-08 DIAGNOSIS — K219 Gastro-esophageal reflux disease without esophagitis: Secondary | ICD-10-CM

## 2014-01-08 DIAGNOSIS — Z23 Encounter for immunization: Secondary | ICD-10-CM

## 2014-01-08 DIAGNOSIS — E876 Hypokalemia: Secondary | ICD-10-CM | POA: Insufficient documentation

## 2014-01-08 DIAGNOSIS — L209 Atopic dermatitis, unspecified: Secondary | ICD-10-CM | POA: Insufficient documentation

## 2014-01-08 LAB — BASIC METABOLIC PANEL
BUN: 16 mg/dL (ref 6–23)
CO2: 27 meq/L (ref 19–32)
Calcium: 10 mg/dL (ref 8.4–10.5)
Chloride: 102 mEq/L (ref 96–112)
Creatinine, Ser: 1.2 mg/dL (ref 0.4–1.2)
GFR: 60.52 mL/min (ref >60.00)
GLUCOSE: 247 mg/dL — AB (ref 70–99)
POTASSIUM: 4.6 meq/L (ref 3.5–5.1)
Sodium: 136 mEq/L (ref 135–145)

## 2014-01-08 LAB — MAGNESIUM: Magnesium: 2 mg/dL (ref 1.5–2.5)

## 2014-01-08 MED ORDER — DOXEPIN HCL 25 MG PO CAPS: 25.0000 mg | ORAL_CAPSULE | Freq: Every evening | ORAL | Status: DC | PRN

## 2014-01-08 MED ORDER — FLUOCINONIDE-E 0.05 % EX CREA: 1.0000 "application " | TOPICAL_CREAM | Freq: Two times a day (BID) | CUTANEOUS | Status: DC

## 2014-01-08 NOTE — Progress Notes (Signed)
Pre visit review using our clinic review tool, if applicable. No additional management support is needed unless otherwise documented below in the visit note. 

## 2014-01-08 NOTE — Assessment & Plan Note (Signed)
Will stop TAC cream and move on to a higher potency topical steroid with lidex Will start doxepin qhs to control the itching

## 2014-01-08 NOTE — Assessment & Plan Note (Signed)
I will recheck her K+ level today 

## 2014-01-08 NOTE — Patient Instructions (Signed)
Eczema Eczema, also called atopic dermatitis, is a skin disorder that causes inflammation of the skin. It causes a red rash and dry, scaly skin. The skin becomes very itchy. Eczema is generally worse during the cooler winter months and often improves with the warmth of summer. Eczema usually starts showing signs in infancy. Some children outgrow eczema, but it may last through adulthood.  CAUSES  The exact cause of eczema is not known, but it appears to run in families. People with eczema often have a family history of eczema, allergies, asthma, or hay fever. Eczema is not contagious. Flare-ups of the condition may be caused by:   Contact with something you are sensitive or allergic to.   Stress. SIGNS AND SYMPTOMS  Dry, scaly skin.   Red, itchy rash.   Itchiness. This may occur before the skin rash and may be very intense.  DIAGNOSIS  The diagnosis of eczema is usually made based on symptoms and medical history. TREATMENT  Eczema cannot be cured, but symptoms usually can be controlled with treatment and other strategies. A treatment plan might include:  Controlling the itching and scratching.   Use over-the-counter antihistamines as directed for itching. This is especially useful at night when the itching tends to be worse.   Use over-the-counter steroid creams as directed for itching.   Avoid scratching. Scratching makes the rash and itching worse. It may also result in a skin infection (impetigo) due to a break in the skin caused by scratching.   Keeping the skin well moisturized with creams every day. This will seal in moisture and help prevent dryness. Lotions that contain alcohol and water should be avoided because they can dry the skin.   Limiting exposure to things that you are sensitive or allergic to (allergens).   Recognizing situations that cause stress.   Developing a plan to manage stress.  HOME CARE INSTRUCTIONS   Only take over-the-counter or  prescription medicines as directed by your health care provider.   Do not use anything on the skin without checking with your health care provider.   Keep baths or showers short (5 minutes) in warm (not hot) water. Use mild cleansers for bathing. These should be unscented. You may add nonperfumed bath oil to the bath water. It is best to avoid soap and bubble bath.   Immediately after a bath or shower, when the skin is still damp, apply a moisturizing ointment to the entire body. This ointment should be a petroleum ointment. This will seal in moisture and help prevent dryness. The thicker the ointment, the better. These should be unscented.   Keep fingernails cut short. Children with eczema may need to wear soft gloves or mittens at night after applying an ointment.   Dress in clothes made of cotton or cotton blends. Dress lightly, because heat increases itching.   A child with eczema should stay away from anyone with fever blisters or cold sores. The virus that causes fever blisters (herpes simplex) can cause a serious skin infection in children with eczema. SEEK MEDICAL CARE IF:   Your itching interferes with sleep.   Your rash gets worse or is not better within 1 week after starting treatment.   You see pus or soft yellow scabs in the rash area.   You have a fever.   You have a rash flare-up after contact with someone who has fever blisters.  Document Released: 03/09/2000 Document Revised: 12/31/2012 Document Reviewed: 10/13/2012 ExitCare Patient Information 2015 [employer]. This information   is not intended to replace advice given to you by your health care provider. Make sure you discuss any questions you have with your health care provider.  

## 2014-01-08 NOTE — Progress Notes (Signed)
Subjective:    Patient ID: Kelli Reyes, female    DOB: [birth year], 68 y.o.   MRN: [id number]  Rash This is a recurrent problem. The current episode started more than 1 month ago. The problem is unchanged. The affected locations include the chest, torso, back, abdomen, left upper leg and right upper leg. The rash is characterized by itchiness and dryness. She was exposed to nothing. Associated symptoms include coughing. Pertinent negatives include no anorexia, congestion, diarrhea, eye pain, facial edema, fatigue, fever, joint pain, nail changes, rhinorrhea, shortness of breath, sore throat or vomiting. Past treatments include oral steroids and topical steroids (TAC cream). The treatment provided mild relief.      Review of Systems  Constitutional: Negative.  Negative for fever, chills, diaphoresis, appetite change and fatigue.  HENT: Negative.  Negative for congestion, rhinorrhea, sore throat and trouble swallowing.   Eyes: Negative.  Negative for pain.  Respiratory: Positive for cough. Negative for apnea, choking, chest tightness, shortness of breath, wheezing and stridor.   Cardiovascular: Negative.  Negative for chest pain, palpitations and leg swelling.  Gastrointestinal: Negative.  Negative for nausea, vomiting, abdominal pain, diarrhea, constipation, blood in stool and anorexia.  Endocrine: Negative.   Genitourinary: Negative.   Musculoskeletal: Positive for arthralgias and back pain. Negative for gait problem, joint pain, joint swelling, myalgias, neck pain and neck stiffness.  Skin: Positive for rash. Negative for nail changes, color change, pallor and wound.  Allergic/Immunologic: Negative.   Neurological: Negative.   Hematological: Negative.  Negative for adenopathy. Does not bruise/bleed easily.  Psychiatric/Behavioral: Negative.        Objective:   Physical Exam  Vitals reviewed. Constitutional: She is oriented to person, place, and time. She appears well-developed  and well-nourished. No distress.  HENT:  Head: Normocephalic and atraumatic.  Mouth/Throat: Oropharynx is clear and moist. No oropharyngeal exudate.  Eyes: Conjunctivae are normal. Right eye exhibits no discharge. Left eye exhibits no discharge. No scleral icterus.  Neck: Normal range of motion. Neck supple. No JVD present. No tracheal deviation present. No thyromegaly present.  Cardiovascular: Normal rate, regular rhythm, normal heart sounds and intact distal pulses.  Exam reveals no gallop and no friction rub.   No murmur heard. Pulmonary/Chest: Effort normal and breath sounds normal. No stridor. No respiratory distress. She has no wheezes. She has no rales. She exhibits no tenderness.  Abdominal: Soft. Bowel sounds are normal. She exhibits no distension and no mass. There is no tenderness. There is no rebound and no guarding.  Musculoskeletal: Normal range of motion. She exhibits no edema and no tenderness.  Lymphadenopathy:    She has no cervical adenopathy.  Neurological: She is oriented to person, place, and time.  Skin: Skin is warm, dry and intact. Rash noted. No abrasion, no bruising, no burn, no ecchymosis, no laceration, no lesion, no petechiae and no purpura noted. Rash is papular. Rash is not macular, not maculopapular, not nodular, not pustular, not vesicular and not urticarial. She is not diaphoretic. No cyanosis or erythema. No pallor.  There are diffuse patches of coalesced papules with scale and lichenification      Lab Results  Component Value Date   WBC 9.2 12/07/2013   HGB 11.8* 12/07/2013   HCT 36.1 12/07/2013   PLT 274.0 12/07/2013   GLUCOSE 81 12/07/2013   CHOL 234* 06/29/2013   TRIG 139.0 06/29/2013   HDL 42.90 06/29/2013   LDLDIRECT 65.3 07/15/2012   LDLCALC 163* 06/29/2013   ALT 16 12/07/2013  AST 25 12/07/2013   NA 139 12/07/2013   K 2.8* 12/07/2013   CL 105 12/07/2013   CREATININE 0.9 12/07/2013   BUN 7 12/07/2013   CO2 24 12/07/2013   TSH 1.32 12/07/2013   INR 1.1*  08/04/2012   HGBA1C 5.7 12/01/2012      Assessment & Plan:

## 2014-01-19 ENCOUNTER — Telehealth: Payer: Self-pay | Admitting: Internal Medicine

## 2014-01-19 DIAGNOSIS — M25561 Pain in right knee: Secondary | ICD-10-CM

## 2014-01-19 DIAGNOSIS — M545 Low back pain, unspecified: Secondary | ICD-10-CM

## 2014-01-19 DIAGNOSIS — G894 Chronic pain syndrome: Secondary | ICD-10-CM

## 2014-01-19 MED ORDER — HYDROCODONE-ACETAMINOPHEN 5-325 MG PO TABS: 1.0000 | ORAL_TABLET | ORAL | Status: DC | PRN

## 2014-01-19 NOTE — Telephone Encounter (Signed)
Pt requesting refill of hydrocodone, she takes for her back pain. [place]

## 2014-01-19 NOTE — Telephone Encounter (Signed)
Called pt no answer [facility] rx ready for pick-up. Place in cabinet...Kelli Reyes

## 2014-01-19 NOTE — Telephone Encounter (Signed)
done

## 2014-01-19 NOTE — Progress Notes (Signed)
Quick Note:  Ov set for 01/21/14 ______

## 2014-01-21 ENCOUNTER — Encounter: Payer: Self-pay | Admitting: Internal Medicine

## 2014-01-21 ENCOUNTER — Ambulatory Visit (INDEPENDENT_AMBULATORY_CARE_PROVIDER_SITE_OTHER)
Admission: RE | Admit: 2014-01-21 | Discharge: 2014-01-21 | Disposition: A | Discharge status: Home / Self Care | Payer: Medicare Other | Source: Ambulatory Visit | Attending: Internal Medicine | Admitting: Internal Medicine

## 2014-01-21 ENCOUNTER — Ambulatory Visit (INDEPENDENT_AMBULATORY_CARE_PROVIDER_SITE_OTHER): Payer: Medicare Other | Admitting: Internal Medicine

## 2014-01-21 VITALS — BP 90/60 | HR 110 | Ht 64.0 in | Wt 145.0 lb

## 2014-01-21 DIAGNOSIS — J449 Chronic obstructive pulmonary disease, unspecified: Secondary | ICD-10-CM

## 2014-01-21 DIAGNOSIS — R918 Other nonspecific abnormal finding of lung field: Secondary | ICD-10-CM

## 2014-01-21 NOTE — Assessment & Plan Note (Signed)
-   PFT's 10/05/11 FEV1  0.94 (46%) ratio 52 and 29% better p B2 - spirometry 01/21/2014 >   FEV1 0.97 (50%) ratio 61 p am Anororo   She is sob x 100 ft walking to next door neighbor's house only a little better p rx with pred so unfortunately this is mostly fixed obst and unlikely to be able tolerate LULobectomy, discussed  For now continue anoro daily

## 2014-01-21 NOTE — Patient Instructions (Addendum)
Please see patient coordinator before you leave today  to schedule PET scan and I will call you with results

## 2014-01-21 NOTE — Assessment & Plan Note (Signed)
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 NO ATYPIA OR MALIGNANCY  and cyt  neg > pred x 6 days symptoms better and cxr less prominent "mass" 01/07/2014 so rec prednisone x 12 days > f/u cxr 01/21/2014 increase mass  Discussed in detail all the  indications, usual  risks and alternatives  relative to the benefits with patient who agrees to proceed with PET first then repeat fob vs fna bx but do not feel she's likely to be a surgical candidate

## 2014-01-21 NOTE — Progress Notes (Signed)
Subjective:    Patient ID: Kelli Reyes, female    DOB: 05-29-1945   MRN: [id number]    Brief patient profile:  30 yobf quit smoking 1998 with onset of cough / sob in 1995 and not consistently better since so referred by Dr Ronnald Ramp 10/05/2011 to pulmonary clinic but lost to f/u until reconsulted 12/18/13 for lung mass.   10/05/2011 1st pulmonary eval cc chronic doe x over a decade min progressive x 2 -3 blocks some better since spiriva and mostly dry cough worse but improved on nasonex.   Typically wakes up about 3am nightly then takes spriva, does better p drinking water. No seasonal variability, h/o asthma or childhood allergies.  No  purulent sputum or sinus/hb symptoms on present rx. rec Continue prilosec 20 mg Take 30-60 min before first meal of the day and ADD Pepcid 20 mg one at bedtime GERD diet reviewed Please schedule a follow up office visit in 4- 6 weeks, call sooner if needed with PFT's .  11/16/2011 f/u ov/Semiyah Newgent no show    12/18/2013 consultation/ Christiona Siddique / referred by Dr Alona Bene   Chief Complaint  Patient presents with  . Follow-up    Last seen in 2013. Pt states referred back by Dr Ronnald Ramp for eval of abnormal ct chest. She reports having increased wheezing and SOB recently. Gets out of breath walking approx 1 block.    indolent onset gradually worse sob x sev months/cough some better since started anoro,  No hemoptysis or purulent sputum rec   Thursday Oct 1  with   Bronchoscopy with biopsy> endobronchial swelling LUL > nl mucosa/ neg cytology on bal > rec 6 days of pred then return to regroup   01/07/2014 f/u ov/Tyrick Dunagan re:  Chief Complaint  Patient presents with  . Follow-up    Pt reports her breathing has improved some, but not back to normal baseline. No new co's today.   Transient improvement while on prednisone, then worse despite continuing anoro rec      01/21/2014 f/u ov/Aanya Haynes re:  Chief Complaint  Patient presents with  . Follow-up    CXR done today. Pt states  breathing has been worse since the last visit. She states that she had some cramping in her left arm earlier this am.       No obvious day to day or daytime variabilty or assoc excess or purulent or bloody mucus  or cp or chest tightness, subjective wheeze overt sinus or hb symptoms. No unusual exp hx or h/o childhood pna/ asthma or knowledge of premature birth.  Sleeping ok without nocturnal  or early am exacerbation  of respiratory  c/o's or need for noct saba. Also denies any obvious fluctuation of symptoms with weather or environmental changes or other aggravating or alleviating factors except as outlined above   Current Medications, Allergies, Complete Past Medical History, Past Surgical History, Family History, and Social History were reviewed in [employer] record.  ROS  The following are not active complaints unless bolded sore throat, dysphagia, dental problems, itching, sneezing,  nasal congestion or excess/ purulent secretions, ear ache,   fever, chills, sweats, unintended wt loss, pleuritic or exertional cp, hemoptysis,  orthopnea pnd or leg swelling, presyncope, palpitations, heartburn, abdominal pain, anorexia, nausea, vomiting, diarrhea  or change in bowel or urinary habits, change in stools or urine, dysuria,hematuria,  rash, arthralgias, visual complaints, headache, numbness weakness or ataxia or problems with walking or coordination,  change in mood/affect or memory.  Objective:   Physical Exam  amb bf nad  Wt 151 10/06/2011 >  12/18/13 142 > 01/07/2014 142 > 01/21/2014  145   HEENT mild turbinate edema.  Oropharynx no thrush or excess pnd or cobblestoning.  No JVD or cervical adenopathy. Min accessory muscle hypertrophy. Trachea midline, nl thryroid. Chest was mildly hyperinflated by percussion with mildly diminished breath sounds and moderate increased exp time without wheeze. Hoover sign positive at mid to late inspiration.  Regular rate and rhythm without murmur gallop or rub or increase P2 or edema.  Abd: no hsm, nl excursion. Ext warm without cyanosis or clubbing.        12/09/13 CT s contrast 1. There is an abnormal soft tissue mass in the left upper lobe  extending to the left hilum which is worrisome for primary pulmonary  malignancy.  2. There are stable findings consistent with previous granulomatous  infection. Calcified and noncalcified pulmonary nodules are stable.  3. There is no bulky hilar lymphadenopathy.  4. The partially imaged spleen appears larger than on the previous  study. There is stable hypodensity in the upper pole of the right  kidney most compatible with a cyst   CXR  01/21/2014 : 1. Increasing size of left upper lobe lung mass concerning for neoplasm. 2. Otherwise stable changes of COPD. 3. Chronic scarring in the right upper lobe.       Assessment & Plan:

## 2014-01-28 ENCOUNTER — Ambulatory Visit (HOSPITAL_COMMUNITY)
Admission: RE | Admit: 2014-01-28 | Discharge: 2014-01-28 | Disposition: A | Discharge status: Home / Self Care | Payer: Medicare Other | Source: Ambulatory Visit | Attending: Internal Medicine | Admitting: Internal Medicine

## 2014-01-28 DIAGNOSIS — R918 Other nonspecific abnormal finding of lung field: Secondary | ICD-10-CM

## 2014-01-28 LAB — GLUCOSE, CAPILLARY: Glucose-Capillary: 85 mg/dL (ref 70–99)

## 2014-01-28 MED ORDER — FLUDEOXYGLUCOSE F - 18 (FDG) INJECTION: 7.2000 | Freq: Once | INTRAVENOUS | Status: AC | PRN

## 2014-02-19 ENCOUNTER — Ambulatory Visit: Payer: Medicare Other | Admitting: Family Medicine

## 2014-02-19 ENCOUNTER — Encounter: Payer: Self-pay | Admitting: Family Medicine

## 2014-02-19 ENCOUNTER — Ambulatory Visit (INDEPENDENT_AMBULATORY_CARE_PROVIDER_SITE_OTHER): Payer: Medicare Other | Admitting: Family Medicine

## 2014-02-19 VITALS — BP 100/70 | HR 100 | Temp 98.6°F | Wt 138.0 lb

## 2014-02-19 DIAGNOSIS — B86 Scabies: Secondary | ICD-10-CM

## 2014-02-19 DIAGNOSIS — M545 Low back pain, unspecified: Secondary | ICD-10-CM

## 2014-02-19 DIAGNOSIS — L309 Dermatitis, unspecified: Secondary | ICD-10-CM

## 2014-02-19 DIAGNOSIS — M25561 Pain in right knee: Secondary | ICD-10-CM

## 2014-02-19 DIAGNOSIS — G894 Chronic pain syndrome: Secondary | ICD-10-CM

## 2014-02-19 DIAGNOSIS — I10 Essential (primary) hypertension: Secondary | ICD-10-CM

## 2014-02-19 DIAGNOSIS — R21 Rash and other nonspecific skin eruption: Secondary | ICD-10-CM

## 2014-02-19 MED ORDER — HYDROCODONE-ACETAMINOPHEN 5-325 MG PO TABS: 1.0000 | ORAL_TABLET | ORAL | Status: DC | PRN

## 2014-02-19 MED ORDER — RANITIDINE HCL 150 MG PO CAPS: 150.0000 mg | ORAL_CAPSULE | Freq: Two times a day (BID) | ORAL | Status: DC

## 2014-02-19 MED ORDER — CETIRIZINE HCL 10 MG PO TABS: 10.0000 mg | ORAL_TABLET | Freq: Two times a day (BID) | ORAL | Status: DC

## 2014-02-19 MED ORDER — PERMETHRIN 5 % EX CREA: 1.0000 "application " | TOPICAL_CREAM | Freq: Once | CUTANEOUS | Status: DC

## 2014-02-19 NOTE — Progress Notes (Signed)
Pre visit review using our clinic review tool, if applicable. No additional management support is needed unless otherwise documented below in the visit note. 

## 2014-02-19 NOTE — Patient Instructions (Signed)

## 2014-02-21 ENCOUNTER — Encounter: Payer: Self-pay | Admitting: Family Medicine

## 2014-02-21 DIAGNOSIS — L309 Dermatitis, unspecified: Secondary | ICD-10-CM | POA: Insufficient documentation

## 2014-02-21 NOTE — Assessment & Plan Note (Signed)
Well controlled, no changes to meds. Encouraged heart healthy diet such as the DASH diet and exercise as tolerated.  °

## 2014-02-21 NOTE — Assessment & Plan Note (Signed)
Distribution suspicious of scaabies. Treated with Permethrin to apply head to toe qhs and wash off in am. Encouraged Zyrtec and Zantac bid and if no improvement has been referred to dermatology

## 2014-02-21 NOTE — Progress Notes (Signed)
Patient ID: Kelli Reyes, female   DOB: 1945/05/18, 68 y.o.   MRN: [id number]   Kelli Reyes  [id number] 1945/08/03 02/21/2014      Progress Note-Follow Up  Subjective  Chief Complaint  No chief complaint on file.   HPI  Patient is a 68 y.o. female in today for routine medical care. Patient is in today complaining of roughly 3 months worth of persistent pruritic rashes. She has tried topical treatments including steroids with only temporary results. She denies anybody in her social milieu having any trouble with itching until this week. Her partner has just noticed this. The lesions are all in the axilla groin and under her breasts for the most part. No other recent illness or complaints. Denies CP/palp/SOB/HA/congestion/fevers/GI or GU c/o. Taking meds as prescribed  Past Medical History  Diagnosis Date  . COPD (chronic obstructive pulmonary disease)   . GERD (gastroesophageal reflux disease)   . Hypertension   . Osteoarthritis   . Lung nodules     Past Surgical History  Procedure Laterality Date  . Abdominal hysterectomy    . Video bronchoscopy Bilateral 12/24/2013    Procedure: VIDEO BRONCHOSCOPY WITH FLUORO;  Surgeon: Tanda Rockers, MD;  Location: [facility];  Service: Cardiopulmonary;  Laterality: Bilateral;    Family History  Problem Relation Age of Onset  . Hyperlipidemia Other   . Cancer Neg Hx   . Stroke Neg Hx   . Heart disease Neg Hx     History   Social History  . Marital Status: Widowed    Spouse Name: N/A    Number of Children: 4  . Years of Education: N/A   Occupational History  . school bus driver     [place] History Main Topics  . Smoking status: Former Smoker -- 2.00 packs/day for 25 years    Types: Cigarettes    Quit date: 09/03/1996  . Smokeless tobacco: Never Used  . Alcohol Use: No     Comment: beer 2-3 times a week  . Drug Use: No  . Sexual Activity: Not Currently   Other Topics Concern  . Not on file    Social History Narrative   No regular exercise          Current Outpatient Prescriptions on File Prior to Visit  Medication Sig Dispense Refill  . losartan-hydrochlorothiazide (HYZAAR) 50-12.5 MG per tablet Take 1 tablet by mouth daily.    . Potassium Chloride CR (MICRO-K) 8 MEQ CPCR capsule CR Take 8 mEq by mouth 2 (two) times daily.    Marland Kitchen Umeclidinium-Vilanterol (ANORO ELLIPTA) 62.5-25 MCG/INH AEPB Inhale 1 puff into the lungs daily. 30 each 11  . mometasone (NASONEX) 50 MCG/ACT nasal spray Place 2 sprays into the nose daily. 17 g 12   No current facility-administered medications on file prior to visit.    Allergies  Allergen Reactions  . Ibuprofen     REACTION: rash    Review of Systems  Review of Systems  Constitutional: Negative for fever and malaise/fatigue.  HENT: Negative for congestion.   Eyes: Negative for discharge.  Respiratory: Negative for shortness of breath.   Cardiovascular: Negative for chest pain, palpitations and leg swelling.  Gastrointestinal: Negative for nausea, abdominal pain and diarrhea.  Genitourinary: Negative for dysuria.  Musculoskeletal: Negative for falls.  Skin: Positive for itching and rash.  Neurological: Negative for loss of consciousness and headaches.  Endo/Heme/Allergies: Negative for polydipsia.  Psychiatric/Behavioral: Negative for depression and suicidal ideas. The  patient is not nervous/anxious and does not have insomnia.     Objective  BP 100/70 mmHg  Pulse 100  Temp(Src) 98.6 F (37 C) (Oral)  Wt 138 lb (62.596 kg)  SpO2 98%  Physical Exam  Physical Exam  Constitutional: She is oriented to person, place, and time and well-developed, well-nourished, and in no distress. No distress.  HENT:  Head: Normocephalic and atraumatic.  Eyes: Conjunctivae are normal.  Neck: Neck supple. No thyromegaly present.  Cardiovascular: Normal rate, regular rhythm and normal heart sounds.   No murmur heard. Pulmonary/Chest: Effort  normal and breath sounds normal. She has no wheezes.  Abdominal: She exhibits no distension and no mass.  Musculoskeletal: She exhibits no edema.  Lymphadenopathy:    She has no cervical adenopathy.  Neurological: She is alert and oriented to person, place, and time.  Skin: Skin is warm and dry. Rash noted. She is not diaphoretic.  Scattered maculopapular lesions in axillae, under breasts, and in groin. No erythema or pustules  Psychiatric: Memory, affect and judgment normal.    Lab Results  Component Value Date   TSH 1.32 12/07/2013   Lab Results  Component Value Date   WBC 9.2 12/07/2013   HGB 11.8* 12/07/2013   HCT 36.1 12/07/2013   MCV 87.7 12/07/2013   PLT 274.0 12/07/2013   Lab Results  Component Value Date   CREATININE 1.2 01/08/2014   BUN 16 01/08/2014   NA 136 01/08/2014   K 4.6 01/08/2014   CL 102 01/08/2014   CO2 27 01/08/2014   Lab Results  Component Value Date   ALT 16 12/07/2013   AST 25 12/07/2013   ALKPHOS 138* 12/07/2013   BILITOT 0.3 12/07/2013   Lab Results  Component Value Date   CHOL 234* 06/29/2013   Lab Results  Component Value Date   HDL 42.90 06/29/2013   Lab Results  Component Value Date   LDLCALC 163* 06/29/2013   Lab Results  Component Value Date   TRIG 139.0 06/29/2013   Lab Results  Component Value Date   CHOLHDL 5 06/29/2013     Assessment & Plan  Essential hypertension, benign Well controlled, no changes to meds. Encouraged heart healthy diet such as the DASH diet and exercise as tolerated.   Dermatitis Distribution suspicious of scaabies. Treated with Permethrin to apply head to toe qhs and wash off in am. Encouraged Zyrtec and Zantac bid and if no improvement has been referred to dermatology

## 2014-02-23 ENCOUNTER — Telehealth: Payer: Self-pay | Admitting: *Deleted

## 2014-02-23 NOTE — Telephone Encounter (Signed)
[facility] Triage Call Report Triage Record Num: [id number] Operator: Trinna Balloon Patient Name: Roshonda Sperl Call Date & Time: 02/19/2014 10:50:59AM Patient Phone: [phone] PCP: Scarlette Calico Patient Gender: Female PCP Fax : Patient DOB: 1945-04-29 Practice Name: [facility] Reason for Call: Caller: [place]/Patient; PCP: Scarlette Calico (Adults only); CB#: [phone]; Pt was dx'd w/eczema, dealing with this for 3 months. None of the medications have decreased her itching. She's scratching herself to pieces today. Appt sched for 3014 today w/Dr. Birdie Riddle. Rash Protocol. Protocol(s) Used: Rash Recommended Outcome per Protocol: See Provider within 24 hours Reason for Outcome: Evaluated by provider AND symptoms worsening when following recommended treatment plan Care Advice: ~ 11/

## 2014-02-25 ENCOUNTER — Ambulatory Visit (INDEPENDENT_AMBULATORY_CARE_PROVIDER_SITE_OTHER): Payer: Medicare Other | Admitting: Internal Medicine

## 2014-02-25 ENCOUNTER — Encounter: Payer: Self-pay | Admitting: Internal Medicine

## 2014-02-25 ENCOUNTER — Ambulatory Visit: Payer: Medicare Other | Admitting: Internal Medicine

## 2014-02-25 ENCOUNTER — Other Ambulatory Visit (INDEPENDENT_AMBULATORY_CARE_PROVIDER_SITE_OTHER): Payer: Medicare Other

## 2014-02-25 VITALS — BP 102/80 | HR 101 | Temp 99.0°F | Resp 16 | Ht 62.0 in | Wt 139.0 lb

## 2014-02-25 DIAGNOSIS — L299 Pruritus, unspecified: Secondary | ICD-10-CM | POA: Insufficient documentation

## 2014-02-25 DIAGNOSIS — E876 Hypokalemia: Secondary | ICD-10-CM

## 2014-02-25 DIAGNOSIS — I10 Essential (primary) hypertension: Secondary | ICD-10-CM

## 2014-02-25 DIAGNOSIS — L309 Dermatitis, unspecified: Secondary | ICD-10-CM

## 2014-02-25 DIAGNOSIS — L209 Atopic dermatitis, unspecified: Secondary | ICD-10-CM

## 2014-02-25 DIAGNOSIS — D472 Monoclonal gammopathy: Secondary | ICD-10-CM

## 2014-02-25 LAB — COMPREHENSIVE METABOLIC PANEL
ALT: 16 U/L (ref 0–35)
AST: 20 U/L (ref 0–37)
Albumin: 3.2 g/dL — ABNORMAL LOW (ref 3.5–5.2)
Alkaline Phosphatase: 126 U/L — ABNORMAL HIGH (ref 39–117)
BILIRUBIN TOTAL: 0.6 mg/dL (ref 0.2–1.2)
BUN: 8 mg/dL (ref 6–23)
CO2: 25 meq/L (ref 19–32)
CREATININE: 0.9 mg/dL (ref 0.4–1.2)
Calcium: 9.7 mg/dL (ref 8.4–10.5)
Chloride: 103 mEq/L (ref 96–112)
GFR: 77.29 mL/min (ref >60.00)
GLUCOSE: 97 mg/dL (ref 70–99)
Potassium: 3.3 mEq/L — ABNORMAL LOW (ref 3.5–5.1)
SODIUM: 139 meq/L (ref 135–145)
TOTAL PROTEIN: 8 g/dL (ref 6.0–8.3)

## 2014-02-25 LAB — CBC WITH DIFFERENTIAL/PLATELET
Basophils Absolute: 0.1 10*3/uL (ref 0.0–0.1)
Basophils Relative: 0.5 % (ref 0.0–3.0)
EOS PCT: 6 % — AB (ref 0.0–5.0)
Eosinophils Absolute: 0.6 10*3/uL (ref 0.0–0.7)
HEMATOCRIT: 36.6 % (ref 36.0–46.0)
HEMOGLOBIN: 11.9 g/dL — AB (ref 12.0–15.0)
LYMPHS ABS: 1.3 10*3/uL (ref 0.7–4.0)
Lymphocytes Relative: 13.3 % (ref 12.0–46.0)
MCHC: 32.4 g/dL (ref 30.0–36.0)
MCV: 85.8 fl (ref 78.0–100.0)
Monocytes Absolute: 1.3 10*3/uL — ABNORMAL HIGH (ref 0.1–1.0)
Monocytes Relative: 13.7 % — ABNORMAL HIGH (ref 3.0–12.0)
NEUTROS ABS: 6.5 10*3/uL (ref 1.4–7.7)
Neutrophils Relative %: 66.5 % (ref 43.0–77.0)
Platelets: 358 10*3/uL (ref 150.0–400.0)
RBC: 4.26 Mil/uL (ref 3.87–5.11)
RDW: 15.1 % (ref 11.5–15.5)
WBC: 9.8 10*3/uL (ref 4.0–10.5)

## 2014-02-25 LAB — SEDIMENTATION RATE: SED RATE: 97 mm/h — AB (ref 0–22)

## 2014-02-25 MED ORDER — TRIAMCINOLONE ACETONIDE 0.5 % EX CREA: 1.0000 "application " | TOPICAL_CREAM | Freq: Three times a day (TID) | CUTANEOUS | Status: DC

## 2014-02-25 MED ORDER — DOXEPIN HCL 25 MG PO CAPS: 25.0000 mg | ORAL_CAPSULE | Freq: Every evening | ORAL | Status: DC | PRN

## 2014-02-25 NOTE — Progress Notes (Signed)
Pre visit review using our clinic review tool, if applicable. No additional management support is needed unless otherwise documented below in the visit note. 

## 2014-02-25 NOTE — Patient Instructions (Signed)
Eczema Eczema, also called atopic dermatitis, is a skin disorder that causes inflammation of the skin. It causes a red rash and dry, scaly skin. The skin becomes very itchy. Eczema is generally worse during the cooler winter months and often improves with the warmth of summer. Eczema usually starts showing signs in infancy. Some children outgrow eczema, but it may last through adulthood.  CAUSES  The exact cause of eczema is not known, but it appears to run in families. People with eczema often have a family history of eczema, allergies, asthma, or hay fever. Eczema is not contagious. Flare-ups of the condition may be caused by:   Contact with something you are sensitive or allergic to.   Stress. SIGNS AND SYMPTOMS  Dry, scaly skin.   Red, itchy rash.   Itchiness. This may occur before the skin rash and may be very intense.  DIAGNOSIS  The diagnosis of eczema is usually made based on symptoms and medical history. TREATMENT  Eczema cannot be cured, but symptoms usually can be controlled with treatment and other strategies. A treatment plan might include:  Controlling the itching and scratching.   Use over-the-counter antihistamines as directed for itching. This is especially useful at night when the itching tends to be worse.   Use over-the-counter steroid creams as directed for itching.   Avoid scratching. Scratching makes the rash and itching worse. It may also result in a skin infection (impetigo) due to a break in the skin caused by scratching.   Keeping the skin well moisturized with creams every day. This will seal in moisture and help prevent dryness. Lotions that contain alcohol and water should be avoided because they can dry the skin.   Limiting exposure to things that you are sensitive or allergic to (allergens).   Recognizing situations that cause stress.   Developing a plan to manage stress.  HOME CARE INSTRUCTIONS   Only take over-the-counter or  prescription medicines as directed by your health care provider.   Do not use anything on the skin without checking with your health care provider.   Keep baths or showers short (5 minutes) in warm (not hot) water. Use mild cleansers for bathing. These should be unscented. You may add nonperfumed bath oil to the bath water. It is best to avoid soap and bubble bath.   Immediately after a bath or shower, when the skin is still damp, apply a moisturizing ointment to the entire body. This ointment should be a petroleum ointment. This will seal in moisture and help prevent dryness. The thicker the ointment, the better. These should be unscented.   Keep fingernails cut short. Children with eczema may need to wear soft gloves or mittens at night after applying an ointment.   Dress in clothes made of cotton or cotton blends. Dress lightly, because heat increases itching.   A child with eczema should stay away from anyone with fever blisters or cold sores. The virus that causes fever blisters (herpes simplex) can cause a serious skin infection in children with eczema. SEEK MEDICAL CARE IF:   Your itching interferes with sleep.   Your rash gets worse or is not better within 1 week after starting treatment.   You see pus or soft yellow scabs in the rash area.   You have a fever.   You have a rash flare-up after contact with someone who has fever blisters.  Document Released: 03/09/2000 Document Revised: 12/31/2012 Document Reviewed: 10/13/2012 ExitCare Patient Information 2015 [employer]. This information   is not intended to replace advice given to you by your health care provider. Make sure you discuss any questions you have with your health care provider.  

## 2014-02-26 MED ORDER — POTASSIUM CHLORIDE ER 8 MEQ PO CPCR: 16.0000 meq | ORAL_CAPSULE | Freq: Two times a day (BID) | ORAL | Status: DC

## 2014-02-26 NOTE — Assessment & Plan Note (Signed)
I think hydrocodone is the main concern with this I have asked her to stop this if possible Will change her antihistamine to doxepin since the current ones are not helping

## 2014-02-26 NOTE — Progress Notes (Signed)
Subjective:    Patient ID: Kelli Reyes, female    DOB: 18-Jul-1945, 68 y.o.   MRN: [id number]  Rash This is a recurrent problem. The current episode started more than 1 month ago. The problem is unchanged. The affected locations include the chest, torso, right upper leg and left upper leg. The rash is characterized by redness, scaling, dryness and itchiness. She was exposed to nothing. Pertinent negatives include no anorexia, congestion, cough, diarrhea, eye pain, facial edema, fatigue, fever, joint pain, nail changes, rhinorrhea, shortness of breath, sore throat or vomiting. Past treatments include antihistamine and oral steroids. The treatment provided mild relief. Her past medical history is significant for allergies and eczema. There is no history of asthma or varicella.      Review of Systems  Constitutional: Negative.  Negative for fever, chills, diaphoresis, appetite change and fatigue.  HENT: Negative.  Negative for congestion, rhinorrhea and sore throat.   Eyes: Negative.  Negative for pain.  Respiratory: Negative.  Negative for cough, choking, chest tightness, shortness of breath, wheezing and stridor.   Cardiovascular: Negative.  Negative for chest pain, palpitations and leg swelling.  Gastrointestinal: Negative.  Negative for vomiting, abdominal pain, diarrhea and anorexia.  Endocrine: Negative.   Genitourinary: Negative.   Musculoskeletal: Negative.  Negative for myalgias, back pain, joint pain, joint swelling and arthralgias.  Skin: Positive for rash. Negative for nail changes, color change, pallor and wound.  Allergic/Immunologic: Negative.   Neurological: Negative.  Negative for dizziness.  Hematological: Negative.  Negative for adenopathy. Does not bruise/bleed easily.  Psychiatric/Behavioral: Negative.        Objective:   Physical Exam  Constitutional: She is oriented to person, place, and time. She appears well-developed and well-nourished. No distress.  HENT:    Head: Normocephalic and atraumatic.  Mouth/Throat: Oropharynx is clear and moist. No oropharyngeal exudate.  Eyes: Conjunctivae are normal. Right eye exhibits no discharge. Left eye exhibits no discharge. No scleral icterus.  Neck: Normal range of motion. Neck supple. No JVD present. No tracheal deviation present. No thyromegaly present.  Cardiovascular: Normal rate, regular rhythm, normal heart sounds and intact distal pulses.  Exam reveals no gallop and no friction rub.   No murmur heard. Pulmonary/Chest: Effort normal and breath sounds normal. No stridor. No respiratory distress. She has no wheezes. She has no rales. She exhibits no tenderness.  Abdominal: Soft. Bowel sounds are normal. She exhibits no distension and no mass. There is no tenderness. There is no rebound and no guarding.  Musculoskeletal: Normal range of motion. She exhibits no edema or tenderness.  Lymphadenopathy:    She has no cervical adenopathy.  Neurological: She is oriented to person, place, and time.  Skin: Skin is warm, dry and intact. Rash noted. No purpura noted. Rash is papular. Rash is not macular, not maculopapular, not nodular, not pustular, not vesicular and not urticarial. She is not diaphoretic. No cyanosis. No pallor. Nails show no clubbing.     Vitals reviewed.    Lab Results  Component Value Date   WBC 9.8 02/25/2014   HGB 11.9* 02/25/2014   HCT 36.6 02/25/2014   PLT 358.0 02/25/2014   GLUCOSE 97 02/25/2014   CHOL 234* 06/29/2013   TRIG 139.0 06/29/2013   HDL 42.90 06/29/2013   LDLDIRECT 65.3 07/15/2012   LDLCALC 163* 06/29/2013   ALT 16 02/25/2014   AST 20 02/25/2014   NA 139 02/25/2014   K 3.3* 02/25/2014   CL 103 02/25/2014   CREATININE 0.9 02/25/2014  BUN 8 02/25/2014   CO2 25 02/25/2014   TSH 1.32 12/07/2013   INR 1.1* 08/04/2012   HGBA1C 5.7 12/01/2012       Assessment & Plan:

## 2014-02-26 NOTE — Assessment & Plan Note (Signed)
I don't think she has scabies and permethrin did not help Will treat topically with TAC and will change her antihistamine to doxepin

## 2014-02-26 NOTE — Assessment & Plan Note (Signed)
Her K+ level remains low I have asked her to restart a K+ replacement

## 2014-03-01 ENCOUNTER — Telehealth: Payer: Self-pay | Admitting: Internal Medicine

## 2014-03-01 LAB — SPEP & IFE WITH QIG
ALPHA-2-GLOBULIN: 13.3 % — AB (ref 7.1–11.8)
Albumin ELP: 42.8 % — ABNORMAL LOW (ref 55.8–66.1)
Alpha-1-Globulin: 8.2 % — ABNORMAL HIGH (ref 2.9–4.9)
BETA GLOBULIN: 5.4 % (ref 4.7–7.2)
Beta 2: 7.2 % — ABNORMAL HIGH (ref 3.2–6.5)
Gamma Globulin: 23.1 % — ABNORMAL HIGH (ref 11.1–18.8)
IGA: 288 mg/dL (ref 69–380)
IgG (Immunoglobin G), Serum: 2120 mg/dL — ABNORMAL HIGH (ref 690–1700)
IgM, Serum: 77 mg/dL (ref 52–322)
Total Protein, Serum Electrophoresis: 7.9 g/dL (ref 6.0–8.3)

## 2014-03-01 NOTE — Telephone Encounter (Signed)
Patient is requesting a call back with lab results

## 2014-03-03 NOTE — Telephone Encounter (Signed)
Labs are ok, except for elevated sed rate. OV w/Dr Ronnald Ramp next week pls Thx

## 2014-03-04 ENCOUNTER — Ambulatory Visit: Payer: Medicare Other | Admitting: Internal Medicine

## 2014-03-04 ENCOUNTER — Ambulatory Visit (INDEPENDENT_AMBULATORY_CARE_PROVIDER_SITE_OTHER): Payer: Medicare Other | Admitting: Internal Medicine

## 2014-03-04 ENCOUNTER — Ambulatory Visit (INDEPENDENT_AMBULATORY_CARE_PROVIDER_SITE_OTHER)
Admission: RE | Admit: 2014-03-04 | Discharge: 2014-03-04 | Disposition: A | Discharge status: Home / Self Care | Payer: Medicare Other | Source: Ambulatory Visit | Attending: Internal Medicine | Admitting: Internal Medicine

## 2014-03-04 ENCOUNTER — Encounter: Payer: Self-pay | Admitting: Internal Medicine

## 2014-03-04 VITALS — BP 128/68 | HR 102 | Temp 99.6°F | Ht 62.0 in | Wt 136.0 lb

## 2014-03-04 DIAGNOSIS — R918 Other nonspecific abnormal finding of lung field: Secondary | ICD-10-CM

## 2014-03-04 MED ORDER — PREDNISONE 10 MG PO TABS: ORAL_TABLET | ORAL | Status: DC

## 2014-03-04 NOTE — Progress Notes (Signed)
Subjective:    Patient ID: Kelli Reyes, female    DOB: Jul 29, 1945   MRN: [id number]    Brief patient profile:  61 yobf quit smoking 1998 with onset of cough / sob in 1995 and not consistently better since so referred by Dr Ronnald Ramp 10/05/2011 to pulmonary clinic but lost to f/u until reconsulted 12/18/13 for lung mass.  History of Present Illness  10/05/2011 1st pulmonary eval cc chronic doe x over a decade min progressive x 2 -3 blocks some better since spiriva and mostly dry cough worse but improved on nasonex.   Typically wakes up about 3am nightly then takes spriva, does better p drinking water. No seasonal variability, h/o asthma or childhood allergies.  No  purulent sputum or sinus/hb symptoms on present rx. rec Continue prilosec 20 mg Take 30-60 min before first meal of the day and ADD Pepcid 20 mg one at bedtime GERD diet reviewed Please schedule a follow up office visit in 4- 6 weeks, call sooner if needed with PFT's .  11/16/2011 f/u ov/Hakan Nudelman no show    12/18/2013 consultation/ Winston Misner / referred by Dr Alona Bene  Re abn cxr  Chief Complaint  Patient presents with  . Follow-up    Last seen in 2013. Pt states referred back by Dr Ronnald Ramp for eval of abnormal ct chest. She reports having increased wheezing and SOB recently. Gets out of breath walking approx 1 block.    indolent onset gradually worse sob x sev months/cough some better since started anoro,  No hemoptysis or purulent sputum rec   Thursday Oct 1  with   Bronchoscopy with biopsy> endobronchial swelling LUL > nl mucosa/ neg cytology on bal > rec 6 days of pred then return to regroup   01/07/2014 f/u ov/Hokulani Rogel re:  Chief Complaint  Patient presents with  . Follow-up    Pt reports her breathing has improved some, but not back to normal baseline. No new co's today.   Transient improvement while on prednisone, then worse despite continuing anoro rec Continue anoro each am  Prednisone Take 4 for three days 3 for three days 2 for three  days 1 for three days and stop  Please schedule a follow up office visit in   weeks, sooner if needed  Late add: with cxr and spirometry on return and consider repeat fob       01/21/2014 f/u ov/Milissa Fesperman re:  Chief Complaint  Patient presents with  . Follow-up    CXR done today. Pt states breathing has been worse since the last visit. She states that she had some cramping in her left arm earlier this am.   rec PET next> c/w sarcoid can't r/o ca    03/04/2014 f/u ov/Melvenia Favela re: prob sarcoid Chief Complaint  Patient presents with  . Follow-up    Pt reports that her breathing has improved, but not back to her normal baseline. She has had fever on and off for the past wk and has prod cough with minimal clear sputum.   Not limited by breathing from desired activities    No obvious day to day or daytime variabilty or assoc  purulent sputum  or bloody mucus  or cp or chest tightness, subjective wheeze overt sinus or hb symptoms. No unusual exp hx or h/o childhood pna/ asthma or knowledge of premature birth.  Sleeping ok without nocturnal  or early am exacerbation  of respiratory  c/o's or need for noct saba. Also denies any obvious fluctuation of symptoms with  weather or environmental changes or other aggravating or alleviating factors except as outlined above   Current Medications, Allergies, Complete Past Medical History, Past Surgical History, Family History, and Social History were reviewed in [employer] record.  ROS  The following are not active complaints unless bolded sore throat, dysphagia, dental problems, itching, sneezing,  nasal congestion or excess/ purulent secretions, ear ache,   fever, chills, sweats, unintended wt loss, pleuritic or exertional cp, hemoptysis,  orthopnea pnd or leg swelling, presyncope, palpitations, heartburn, abdominal pain, anorexia, nausea, vomiting, diarrhea  or change in bowel or urinary habits, change in stools or urine,  dysuria,hematuria,  rash, arthralgias, visual complaints, headache, numbness weakness or ataxia or problems with walking or coordination,  change in mood/affect or memory.                     Objective:   Physical Exam  amb bf nad  Wt 151 10/06/2011 >  12/18/13 142 > 01/07/2014 142 > 01/21/2014  145 >  03/04/2014 136   HEENT mild turbinate edema.  Oropharynx no thrush or excess pnd or cobblestoning.  No JVD or cervical adenopathy. Min accessory muscle hypertrophy. Trachea midline, nl thryroid. Chest was mildly hyperinflated by percussion with mildly diminished breath sounds and moderate increased exp time without wheeze. Hoover sign positive at mid to late inspiration. Regular rate and rhythm without murmur gallop or rub or increase P2 or edema.  Abd: no hsm, nl excursion. Ext warm without cyanosis or clubbing.        12/09/13 CT s contrast 1. There is an abnormal soft tissue mass in the left upper lobe  extending to the left hilum which is worrisome for primary pulmonary  malignancy.  2. There are stable findings consistent with previous granulomatous  infection. Calcified and noncalcified pulmonary nodules are stable.  3. There is no bulky hilar lymphadenopathy.  4. The partially imaged spleen appears larger than on the previous  study. There is stable hypodensity in the upper pole of the right  kidney most compatible with a cyst     CXR PA and Lateral:   03/04/2014 :  1. Stable left upper mediastinal mass. Stable pleural parenchymal thickening right upper lobe consistent with scarring. Stable subpleural tiny pulmonary nodules. As noted on prior studies findings are consistent with patient's known diagnosis of sarcoid. Underlying malignancy cannot be excluded. 2. No acute cardiopulmonary disease.       Assessment & Plan:

## 2014-03-04 NOTE — Patient Instructions (Addendum)
Sarcoidosis is a benign inflammatory condition caused by  The  immune system being too revved up like a thermostat on your furnace that's partially  stuck causing arthitis, rash, short of breath and cough and vision issues.  It typically burns itself out in 75% of patients by the end of 3 years with little to indicate that we really change the natural course of the disease by aggressive treatments intended to alter it.  Treatment is generally reserved for patients with major symptoms we can attribute to sarcoid or if a vital organ (like the eye or kidney or nervous system) becomes affected.   Prednisone 10 mg 2 each with bfast until return to office  Please remember to go to the  x-ray department downstairs for your tests - we will call you with the results when they are available.    Please schedule a follow up office visit in 4 weeks, sooner if needed with cxr

## 2014-03-05 ENCOUNTER — Ambulatory Visit: Payer: Medicare Other | Admitting: Internal Medicine

## 2014-03-05 NOTE — Progress Notes (Signed)
Quick Note:  Spoke with pt and notified of results per Dr. Wert. Pt verbalized understanding and denied any questions.  ______ 

## 2014-03-07 ENCOUNTER — Encounter: Payer: Self-pay | Admitting: Internal Medicine

## 2014-03-07 NOTE — Assessment & Plan Note (Signed)
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 NO ATYPIA OR MALIGNANCY  and cyt  neg > pred x 6 days symptoms better and cxr less prominent "mass" 01/07/2014 so rec prednisone x 12 days > f/u cxr 01/21/2014 increase mass  - PET 01/28/14 >Large left upper lobe paramediastinal intensely hypermetabolic mass coupled with hypermetabolic spleen and symmetric hypermetabolic hilar adenopathy.  ddx sarcoid/ lymphoma/ bronchogenic ca > pt informed 01/29/14 and feeling better, wants to wait for further eval  Discussed in detail all the  indications, usual  risks and alternatives  relative to the benefits with patient who agrees to proceed  With empirical trial of prednisone 20 mg daily x 4 weeks then regroup

## 2014-03-11 ENCOUNTER — Ambulatory Visit: Payer: Medicare Other | Admitting: Internal Medicine

## 2014-03-15 ENCOUNTER — Telehealth: Payer: Self-pay | Admitting: *Deleted

## 2014-03-15 ENCOUNTER — Telehealth: Payer: Self-pay | Admitting: Internal Medicine

## 2014-03-15 MED ORDER — TRAMADOL HCL 50 MG PO TABS: 50.0000 mg | ORAL_TABLET | Freq: Four times a day (QID) | ORAL | Status: DC | PRN

## 2014-03-15 NOTE — Telephone Encounter (Signed)
Patient is requesting script for pain.  Would like call in regards.

## 2014-03-15 NOTE — Telephone Encounter (Signed)
Try tramadol

## 2014-03-15 NOTE — Telephone Encounter (Signed)
Pt.notified

## 2014-03-15 NOTE — Telephone Encounter (Signed)
Pt notified  Per Tramadol

## 2014-03-29 ENCOUNTER — Other Ambulatory Visit: Payer: Self-pay | Admitting: *Deleted

## 2014-03-29 MED ORDER — RANITIDINE HCL 150 MG PO TABS: 150.0000 mg | ORAL_TABLET | Freq: Two times a day (BID) | ORAL | Status: DC

## 2014-03-29 NOTE — Telephone Encounter (Signed)
Rx sent to the pharmacy by e-script.//AB/CMA 

## 2014-03-30 ENCOUNTER — Other Ambulatory Visit: Payer: Self-pay | Admitting: *Deleted

## 2014-03-30 NOTE — Telephone Encounter (Signed)
Request for Tramadol was sent to [place] on 12/21. MD approved. RX faxed and pt notified.

## 2014-04-01 ENCOUNTER — Encounter: Payer: Self-pay | Admitting: Internal Medicine

## 2014-04-01 ENCOUNTER — Ambulatory Visit (INDEPENDENT_AMBULATORY_CARE_PROVIDER_SITE_OTHER): Payer: Medicare Other | Admitting: Internal Medicine

## 2014-04-01 ENCOUNTER — Ambulatory Visit (INDEPENDENT_AMBULATORY_CARE_PROVIDER_SITE_OTHER)
Admission: RE | Admit: 2014-04-01 | Discharge: 2014-04-01 | Disposition: A | Discharge status: Home / Self Care | Payer: Medicare Other | Source: Ambulatory Visit | Attending: Internal Medicine | Admitting: Internal Medicine

## 2014-04-01 VITALS — BP 112/70 | HR 93 | Temp 98.7°F | Ht 62.0 in | Wt 144.0 lb

## 2014-04-01 DIAGNOSIS — R918 Other nonspecific abnormal finding of lung field: Secondary | ICD-10-CM

## 2014-04-01 DIAGNOSIS — J449 Chronic obstructive pulmonary disease, unspecified: Secondary | ICD-10-CM

## 2014-04-01 MED ORDER — AMOXICILLIN-POT CLAVULANATE 875-125 MG PO TABS: 1.0000 | ORAL_TABLET | Freq: Two times a day (BID) | ORAL | Status: DC

## 2014-04-01 MED ORDER — PREDNISONE 10 MG PO TABS: ORAL_TABLET | ORAL | Status: DC

## 2014-04-01 MED ORDER — ACETAMINOPHEN-CODEINE #3 300-30 MG PO TABS: ORAL_TABLET | ORAL | Status: DC

## 2014-04-01 NOTE — Assessment & Plan Note (Signed)
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 NO ATYPIA OR MALIGNANCY  and cyt  neg > pred x 6 days symptoms better and cxr less prominent "mass" 01/07/2014 so rec prednisone x 12 days > f/u cxr 01/21/2014 increase mass  - PET 01/28/14 >Large left upper lobe paramediastinal intensely hypermetabolic mass coupled with hypermetabolic spleen and symmetric hypermetabolic hilar adenopathy.  ddx sarcoid/ lymphoma/ bronchogenic ca > pt informed 01/29/14 and feeling better, wants to wait until p tgivng for f/u - 03/04/2014 rx maint prednisone > some better 04/01/2014 but still sign vol loss on lateral view > rec repeat fob 04/15/14  Discussed in detail all the  indications, usual  risks and alternatives  relative to the benefits with patient who agrees to proceed with bronchoscopy with biopsy of the LUL (previously just did endobronchial of orifice) to be sure this is not a partially responsive lymphoma, esp concerned with reported low grade hemoptysis now which is very unusual in sarcoid absent any evidence of bronchiectasis.

## 2014-04-01 NOTE — Patient Instructions (Addendum)
Come to outpatient registration at [facility] (behind the ER) at 715 am 04/15/13  with nothing to eat or drink after midnight Wednesday for Bronchoscopy  For cough use mucinex dm 1200 mg every 12 hours and as needed Tylenol #3   Augmentin 875 mg take one pill twice daily  X 10 days - take at breakfast and supper with large glass of water.  It would help reduce the usual side effects (diarrhea and yeast infections) if you ate cultured yogurt at lunch.   No change in therapy in meantime

## 2014-04-01 NOTE — Assessment & Plan Note (Signed)
-   PFT's 10/05/11 FEV1  0.94 (46%) ratio 52 and 29% better p B2 - spirometry 01/21/2014 >   FEV1 0.97 (50%) ratio 61 p am Anororo   ? Mild flare off prednisone > restart prednisone (for sarcoid) and start augmentin x 10 days

## 2014-04-01 NOTE — Progress Notes (Signed)
Subjective:    Patient ID: Kelli Reyes, female    DOB: 1945-04-15   MRN: [id number]    Brief patient profile:  37 yobf quit smoking 1998 with onset of cough / sob in 1995 and not consistently better since so referred by Dr Ronnald Ramp 10/05/2011 to pulmonary clinic but lost to f/u until reconsulted 12/18/13 for lung mass with fob 12/24/13 NCG or LUL orifice suggestive of sarcoid.  History of Present Illness  10/05/2011 1st pulmonary eval cc chronic doe x over a decade min progressive x 2 -3 blocks some better since spiriva and mostly dry cough worse but improved on nasonex.   Typically wakes up about 3am nightly then takes spriva, does better p drinking water. No seasonal variability, h/o asthma or childhood allergies.  No  purulent sputum or sinus/hb symptoms on present rx. rec Continue prilosec 20 mg Take 30-60 min before first meal of the day and ADD Pepcid 20 mg one at bedtime GERD diet reviewed Please schedule a follow up office visit in 4- 6 weeks, call sooner if needed with PFT's .  11/16/2011 f/u ov/Venezia Sargeant no show    12/18/2013 consultation/ Navea Woodrow / referred by Dr Alona Bene  Re abn cxr  Chief Complaint  Patient presents with  . Follow-up    Last seen in 2013. Pt states referred back by Dr Ronnald Ramp for eval of abnormal ct chest. She reports having increased wheezing and SOB recently. Gets out of breath walking approx 1 block.    indolent onset gradually worse sob x sev months/cough some better since started anoro,  No hemoptysis or purulent sputum rec   Thursday Oct 1  with   Bronchoscopy with biopsy> endobronchial swelling LUL > nl mucosa/ neg cytology on bal > rec 6 days of pred then return to regroup   01/07/2014 f/u ov/Crystalyn Delia re:  Chief Complaint  Patient presents with  . Follow-up    Pt reports her breathing has improved some, but not back to normal baseline. No new co's today.   Transient improvement while on prednisone, then worse despite continuing anoro rec Continue anoro each am   Prednisone Take 4 for three days 3 for three days 2 for three days 1 for three days and stop  Please schedule a follow up office visit in   weeks, sooner if needed  Late add: with cxr and spirometry on return and consider repeat fob       01/21/2014 f/u ov/Zyair Rhein re:  Chief Complaint  Patient presents with  . Follow-up    CXR done today. Pt states breathing has been worse since the last visit. She states that she had some cramping in her left arm earlier this am.   rec PET next> c/w sarcoid can't r/o ca    03/04/2014 f/u ov/Vanessa Kampf re: prob sarcoid Chief Complaint  Patient presents with  . Follow-up    Pt reports that her breathing has improved, but not back to her normal baseline. She has had fever on and off for the past wk and has prod cough with minimal clear sputum.   Not limited by breathing from desired activities   rec Prednisone 10 mg 2 each with bfast until return to office   04/01/2014 f/u ov/Indira Sorenson re: prob sarcoid, stopped pred 03/29/13 because ran out  Chief Complaint  Patient presents with  . Follow-up    Pt states her breathing is unchanged. She has had increased sputum production over the past wk- coughing up dark grey, blood tinged sputum.  says was doing some better on pred 20 until ran out then much worse with congested cough at hs and in am but no sob    No obvious other patterns in day to day or daytime variabilty or assoc  cp or chest tightness, subjective wheeze overt sinus or hb symptoms. No unusual exp hx or h/o childhood pna/ asthma or knowledge of premature birth.   Also denies any obvious fluctuation of symptoms with weather or environmental changes or other aggravating or alleviating factors except as outlined above   Current Medications, Allergies, Complete Past Medical History, Past Surgical History, Family History, and Social History were reviewed in [employer] record.  ROS  The following are not active complaints unless  bolded sore throat, dysphagia, dental problems, itching, sneezing,  nasal congestion or excess/ purulent secretions, ear ache,   fever, chills, sweats, unintended wt loss, pleuritic or exertional cp, hemoptysis,  orthopnea pnd or leg swelling, presyncope, palpitations, heartburn, abdominal pain, anorexia, nausea, vomiting, diarrhea  or change in bowel or urinary habits, change in stools or urine, dysuria,hematuria,  rash, arthralgias, visual complaints, headache, numbness weakness or ataxia or problems with walking or coordination,  change in mood/affect or memory.                     Objective:  Physical Exam  amb bf nad  Wt 151 10/06/2011 >  12/18/13 142 > 01/07/2014 142 > 01/21/2014  145 >  03/04/2014 136 > 04/01/2014  145    HEENT: nl dentition, turbinates, and orophanx. Nl external ear canals without cough reflex   NECK :  without JVD/Nodes/TM/ nl carotid upstrokes bilaterally   LUNGS: no acc muscle use, junky rhonchi esp on L    CV:  RRR  no s3 or murmur or increase in P2, no edema   ABD:  soft and nontender with nl excursion in the supine position. No bruits or organomegaly, bowel sounds nl  MS:  warm without deformities, calf tenderness, cyanosis or clubbing  SKIN: warm and dry without lesions    NEURO:  alert, approp, no deficits       CXR PA and Lateral:   04/01/2014 : 1. Decreasing conspicuity of medial left upper lobe pulmonary mass suggesting interval response to therapy. 2. Sequelae of prior granulomatous disease throughout the mediastinum and in the right lung. 3. Aortic atherosclerosis.              Assessment & Plan:

## 2014-04-08 ENCOUNTER — Telehealth: Payer: Self-pay | Admitting: Internal Medicine

## 2014-04-08 ENCOUNTER — Ambulatory Visit: Payer: Medicare Other | Admitting: Internal Medicine

## 2014-04-08 MED ORDER — ACETAMINOPHEN-CODEINE #3 300-30 MG PO TABS: ORAL_TABLET | ORAL | Status: DC

## 2014-04-08 NOTE — Telephone Encounter (Signed)
RX called into [facility]. Pt is aware RX called in. Nothing further needed

## 2014-04-08 NOTE — Telephone Encounter (Signed)
MW rx'd tylenol #3 for pt cough. She took last tablet last night. He wrote pt can take 1 every 4 hrs prn. #40 on 04/01/14. Pt scheduled for bronch 04/15/14. MW out of office until Monday. Please advise on refill Dr. Annamaria Boots thanks

## 2014-04-08 NOTE — Telephone Encounter (Signed)
Ok to refill as before 

## 2014-04-08 NOTE — Telephone Encounter (Signed)
Pt did not show up for the appt today, please advise if we need to call and reschedule?

## 2014-04-09 NOTE — Telephone Encounter (Signed)
Pt stated that she will call to make an appt later.

## 2014-04-09 NOTE — Telephone Encounter (Signed)
Call her and ask her to reschedule

## 2014-04-15 ENCOUNTER — Ambulatory Visit (HOSPITAL_COMMUNITY): Payer: Medicare Other

## 2014-04-15 ENCOUNTER — Encounter (HOSPITAL_COMMUNITY)
Admission: RE | Disposition: A | Discharge status: Home / Self Care | Payer: Medicare Other | Source: Ambulatory Visit | Attending: Internal Medicine

## 2014-04-15 ENCOUNTER — Ambulatory Visit (HOSPITAL_COMMUNITY)
Admission: RE | Admit: 2014-04-15 | Discharge: 2014-04-15 | Disposition: A | Discharge status: Home / Self Care | Payer: Medicare Other | Source: Ambulatory Visit | Attending: Internal Medicine | Admitting: Internal Medicine

## 2014-04-15 DIAGNOSIS — Z9889 Other specified postprocedural states: Secondary | ICD-10-CM

## 2014-04-15 DIAGNOSIS — J9811 Atelectasis: Secondary | ICD-10-CM | POA: Insufficient documentation

## 2014-04-15 DIAGNOSIS — K219 Gastro-esophageal reflux disease without esophagitis: Secondary | ICD-10-CM | POA: Insufficient documentation

## 2014-04-15 DIAGNOSIS — D869 Sarcoidosis, unspecified: Secondary | ICD-10-CM

## 2014-04-15 DIAGNOSIS — R918 Other nonspecific abnormal finding of lung field: Secondary | ICD-10-CM

## 2014-04-15 DIAGNOSIS — J449 Chronic obstructive pulmonary disease, unspecified: Secondary | ICD-10-CM | POA: Diagnosis not present

## 2014-04-15 HISTORY — PX: VIDEO BRONCHOSCOPY: SHX5072

## 2014-04-15 SURGERY — BRONCHOSCOPY, WITH FLUOROSCOPY
Anesthesia: Moderate Sedation | Laterality: Bilateral

## 2014-04-15 MED ORDER — LIDOCAINE HCL 1 % IJ SOLN
INTRAMUSCULAR | Status: DC | PRN
  Administered 2014-04-15: 6 mL via RESPIRATORY_TRACT

## 2014-04-15 MED ORDER — MEPERIDINE HCL 100 MG/ML IJ SOLN
INTRAMUSCULAR | Status: AC
  Filled 2014-04-15: qty 2

## 2014-04-15 MED ORDER — PHENYLEPHRINE HCL 0.25 % NA SOLN: 1.0000 | Freq: Four times a day (QID) | NASAL | Status: DC | PRN

## 2014-04-15 MED ORDER — LIDOCAINE HCL 2 % EX GEL
CUTANEOUS | Status: DC | PRN
  Administered 2014-04-15: 1

## 2014-04-15 MED ORDER — LIDOCAINE HCL 2 % EX GEL
1.0000 | Freq: Once | CUTANEOUS | Status: DC
Start: 2014-04-15 — End: 2014-04-15

## 2014-04-15 MED ORDER — SODIUM CHLORIDE 0.9 % IV SOLN
INTRAVENOUS | Status: DC
  Administered 2014-04-15: 08:00:00 via INTRAVENOUS

## 2014-04-15 MED ORDER — MIDAZOLAM HCL 5 MG/ML IJ SOLN: 1.0000 mg | Freq: Once | INTRAMUSCULAR | Status: DC

## 2014-04-15 MED ORDER — MEPERIDINE HCL 25 MG/ML IJ SOLN
INTRAMUSCULAR | Status: DC | PRN
  Administered 2014-04-15: 50 mg via INTRAVENOUS

## 2014-04-15 MED ORDER — PHENYLEPHRINE HCL 0.25 % NA SOLN
NASAL | Status: DC | PRN
Start: 2014-04-15 — End: 2014-04-15
  Administered 2014-04-15: 2 via NASAL

## 2014-04-15 MED ORDER — MEPERIDINE HCL 100 MG/ML IJ SOLN: 100.0000 mg | Freq: Once | INTRAMUSCULAR | Status: DC

## 2014-04-15 MED ORDER — MIDAZOLAM HCL 10 MG/2ML IJ SOLN
INTRAMUSCULAR | Status: DC | PRN
  Administered 2014-04-15 (×2): 2.5 mg via INTRAVENOUS

## 2014-04-15 MED ORDER — MIDAZOLAM HCL 10 MG/2ML IJ SOLN
INTRAMUSCULAR | Status: AC
  Filled 2014-04-15: qty 4

## 2014-04-15 NOTE — H&P (Signed)
Brief patient profile:  68 yobf quit smoking 1998 with onset of cough / sob in 1995 and not consistently better since so referred by Dr Ronnald Ramp 10/05/2011 to pulmonary clinic but lost to f/u until reconsulted 12/18/13 for lung mass with fob 12/24/13 NCG or LUL orifice suggestive of sarcoid.  History of Present Illness  10/05/2011 1st pulmonary eval cc chronic doe x over a decade min progressive x 2 -3 blocks some better since spiriva and mostly dry cough worse but improved on nasonex. Typically wakes up about 3am nightly then takes spriva, does better p drinking water. No seasonal variability, h/o asthma or childhood allergies. No purulent sputum or sinus/hb symptoms on present rx. rec Continue prilosec 20 mg Take 30-60 min before first meal of the day and ADD Pepcid 20 mg one at bedtime GERD diet reviewed Please schedule a follow up office visit in 4- 6 weeks, call sooner if needed with PFT's .  11/16/2011 f/u ov/Bianney Rockwood no show    12/18/2013 consultation/ Stasia Somero / referred by Dr Alona Bene Re abn cxr  Chief Complaint  Patient presents with  . Follow-up    Last seen in 2013. Pt states referred back by Dr Ronnald Ramp for eval of abnormal ct chest. She reports having increased wheezing and SOB recently. Gets out of breath walking approx 1 block.   indolent onset gradually worse sob x sev months/cough some better since started anoro, No hemoptysis or purulent sputum rec  Thursday Oct 1 with Bronchoscopy with biopsy> endobronchial swelling LUL > nl mucosa/ neg cytology on bal > rec 6 days of pred then return to regroup   01/07/2014 f/u ov/Vincenzina Jagoda re:  Chief Complaint  Patient presents with  . Follow-up    Pt reports her breathing has improved some, but not back to normal baseline. No new co's today.   Transient improvement while on prednisone, then worse despite continuing anoro rec Continue anoro each am  Prednisone Take 4 for three days 3 for three days 2 for three days 1 for three  days and stop  Please schedule a follow up office visit in weeks, sooner if needed  Late add: with cxr and spirometry on return and consider repeat fob      01/21/2014 f/u ov/Mecca Barga re:  Chief Complaint  Patient presents with  . Follow-up    CXR done today. Pt states breathing has been worse since the last visit. She states that she had some cramping in her left arm earlier this am.   rec PET next> c/w sarcoid can't r/o ca    03/04/2014 f/u ov/Jennavie Martinek re: prob sarcoid Chief Complaint  Patient presents with  . Follow-up    Pt reports that her breathing has improved, but not back to her normal baseline. She has had fever on and off for the past wk and has prod cough with minimal clear sputum.   Not limited by breathing from desired activities  rec Prednisone 10 mg 2 each with bfast until return to office   04/01/2014 f/u ov/Makhiya Coburn re: prob sarcoid, stopped pred 03/29/13 because ran out  Chief Complaint  Patient presents with  . Follow-up    Pt states her breathing is unchanged. She has had increased sputum production over the past wk- coughing up dark grey, blood tinged sputum.   says was doing some better on pred 20 until ran out then much worse with congested cough at hs and in am but no sob   No obvious other patterns in day to day  or daytime variabilty or assoc cp or chest tightness, subjective wheeze overt sinus or hb symptoms. No unusual exp hx or h/o childhood pna/ asthma or knowledge of premature birth.   Also denies any obvious fluctuation of symptoms with weather or environmental changes or other aggravating or alleviating factors except as outlined above   Current Medications, Allergies, Complete Past Medical History, Past Surgical History, Family History, and Social History were reviewed in [employer] record.  ROS The following are not active complaints unless bolded sore throat, dysphagia, dental problems, itching,  sneezing, nasal congestion or excess/ purulent secretions, ear ache, fever, chills, sweats, unintended wt loss, pleuritic or exertional cp, hemoptysis, orthopnea pnd or leg swelling, presyncope, palpitations, heartburn, abdominal pain, anorexia, nausea, vomiting, diarrhea or change in bowel or urinary habits, change in stools or urine, dysuria,hematuria, rash, arthralgias, visual complaints, headache, numbness weakness or ataxia or problems with walking or coordination, change in mood/affect or memory.              Objective:  Physical Exam  amb bf nad  Wt 151 10/06/2011 > 12/18/13 142 > 01/07/2014 142 > 01/21/2014 145 > 03/04/2014 136 > 04/01/2014 145    HEENT: nl dentition, turbinates, and orophanx. Nl external ear canals without cough reflex   NECK : without JVD/Nodes/TM/ nl carotid upstrokes bilaterally   LUNGS: no acc muscle use, junky rhonchi esp on L    CV: RRR no s3 or murmur or increase in P2, no edema   ABD: soft and nontender with nl excursion in the supine position. No bruits or organomegaly, bowel sounds nl  MS: warm without deformities, calf tenderness, cyanosis or clubbing  SKIN: warm and dry without lesions   NEURO: alert, approp, no deficits    CXR PA and Lateral: 04/01/2014 : 1. Decreasing conspicuity of medial left upper lobe pulmonary mass suggesting interval response to therapy. 2. Sequelae of prior granulomatous disease throughout the mediastinum and in the right lung. 3. Aortic atherosclerosis.           Assessment & Plan:              COPD GOLD III - Tanda Rockers, MD at 04/01/2014 3:12 PM     Status: Written Related Problem: COPD GOLD III   Expand All Collapse All    - PFT's 10/05/11 FEV1 0.94 (46%) ratio 52 and 29% better p B2 - spirometry 01/21/2014 > FEV1 0.97 (50%) ratio 61 p am Anororo   ? 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 NO ATYPIA OR MALIGNANCY and cyt neg > pred x 6 days symptoms better and cxr less prominent "mass" 01/07/2014 so rec prednisone x 12 days > f/u cxr 01/21/2014 increase mass  - PET 01/28/14 >Large left upper lobe paramediastinal intensely hypermetabolic mass coupled with hypermetabolic spleen and symmetric hypermetabolic hilar adenopathy. ddx sarcoid/ lymphoma/ bronchogenic ca > pt informed 01/29/14 and feeling better, wants to wait until p tgivng for f/u - 03/04/2014 rx maint prednisone > some better 04/01/2014 but still sign vol loss on lateral view > rec  repeat fob 04/15/14  Discussed in detail all the indications, usual risks and alternatives relative to the benefits with patient who agrees to proceed with bronchoscopy with biopsy of the LUL (previously just did endobronchial of orifice) to be sure this is not a partially responsive lymphoma, esp concerned with reported low grade hemoptysis now which is very unusual in sarcoid absent any evidence of bronchiectasis.        04/15/2014 day of FOB - minimal mucoid sputum no sob, still on pred 10 mg x 2 daily. No change exam    Christinia Gully, MD Pulmonary and [place] [phone] After 5:30 PM or weekends, call [phone]

## 2014-04-15 NOTE — Discharge Instructions (Signed)
Flexible Bronchoscopy, Care After These instructions give you information on caring for yourself after your procedure. Your doctor may also give you more specific instructions. Call your doctor if you have any problems or questions after your procedure. HOME CARE  Do not eat or drink anything for 2 hours after your procedure. If you try to eat or drink before the medicine wears off, food or drink could go into your lungs. You could also burn yourself.  After 2 hours have passed and when you can cough and gag normally, you may eat soft food and drink liquids slowly.  The day after the test, you may eat your normal diet.  You may do your normal activities.  Keep all doctor visits. GET HELP RIGHT AWAY IF:  You get more and more short of breath.  You get light-headed.  You feel like you are going to pass out (faint).  You have chest pain.  You have new problems that worry you.  You cough up more than a little blood.  You cough up more blood than before. MAKE SURE YOU:  Understand these instructions.  Will watch your condition.  Will get help right away if you are not doing well or get worse. Document Released: 01/07/2009 Document Revised: 03/17/2013 Document Reviewed: 11/14/2012 [facility] Patient Information 2015 [place], [place]. This information is not intended to replace advice given to you by your health care provider. Make sure you discuss any questions you have with your health care provider.  Nothing to eat or drink until  11:00 am   Today  04/15/2014

## 2014-04-15 NOTE — Op Note (Signed)
Bronchoscopy Procedure Note  Date of Operation: 04/15/2014   Pre-op Diagnosis: atelectasis  Post-op Diagnosis: atelectasis  Surgeon: Christinia Gully  Anesthesia: Monitored Local Anesthesia with Sedation  Operation: Video Flexible fiberoptic bronchoscopy, diagnostic   Findings: Mild cobblestoning bilaterally, conc narrowing AP segment LUL  Specimen: bronchial lavage LUL  Estimated Blood Loss: 25 cc  Complications: moderate bleeding p 3 rd bx, placed in LLat decub position and airway suctioned clear  Indications and History: See updated H and P same date. The risks, benefits, complications, treatment options and expected outcomes were discussed with the patient.  The possibilities of reaction to medication, pulmonary aspiration, perforation of a viscus, bleeding, failure to diagnose a condition and creating a complication requiring transfusion or operation were discussed with the patient who freely signed the consent.    Description of Procedure: The patient was re-examined in the bronchoscopy suite and the site of surgery properly noted/marked.  The patient was identified  and the procedure verified as Flexible Fiberoptic Bronchoscopy.  A Time Out was held and the above information confirmed.   After the induction of topical nasopharyngeal anesthesia, the patient was positioned  and the bronchoscope was passed through the R naris. The vocal cords were visualized and  1% buffered lidocaine 5 ml was topically placed onto the cords. The cords were nl. The scope was then passed into the trachea.  1% buffered lidocaine given topically. Airways inspected bilaterally to the subsegmental level with the following findings:  Airways nl except for Mild cobblestoning bilaterally, conc narrowing AP segment LUL   Interventions:  1) BAL LUL for cyt and afb/ fungus 2) TBBx x 3 LUL     The Patient was taken to the Endoscopy Recovery area in satisfactory condition.  Attestation: I performed the  procedure.  Christinia Gully, MD Pulmonary and [place] [phone] After 5:30 PM or weekends, call [phone]

## 2014-04-15 NOTE — Progress Notes (Signed)
Video Bronchoscopy done  Intervention bronchial washings done Intervention Bronchial biopsy done  Procedure done well

## 2014-04-16 ENCOUNTER — Encounter (HOSPITAL_COMMUNITY): Payer: Self-pay | Admitting: Internal Medicine

## 2014-04-20 ENCOUNTER — Telehealth: Payer: Self-pay | Admitting: Internal Medicine

## 2014-04-20 NOTE — Telephone Encounter (Signed)
Spoke with the pt and scheduled appt for 04/22/13 at 1:30 pm

## 2014-04-20 NOTE — Telephone Encounter (Signed)
Pt is requesting bronch results from 1/21. Please advise thanks

## 2014-04-20 NOTE — Telephone Encounter (Signed)
Discussed with pt, needs ov asap with pfts but can do bedside spirometry if booked in pft lab

## 2014-04-22 ENCOUNTER — Ambulatory Visit (INDEPENDENT_AMBULATORY_CARE_PROVIDER_SITE_OTHER): Payer: BC Managed Care – PPO | Admitting: Internal Medicine

## 2014-04-22 ENCOUNTER — Other Ambulatory Visit (INDEPENDENT_AMBULATORY_CARE_PROVIDER_SITE_OTHER): Payer: Medicare Other

## 2014-04-22 ENCOUNTER — Encounter: Payer: Self-pay | Admitting: Internal Medicine

## 2014-04-22 VITALS — BP 110/70 | HR 117 | Ht 62.0 in | Wt 142.0 lb

## 2014-04-22 DIAGNOSIS — R0600 Dyspnea, unspecified: Secondary | ICD-10-CM

## 2014-04-22 DIAGNOSIS — D869 Sarcoidosis, unspecified: Secondary | ICD-10-CM

## 2014-04-22 DIAGNOSIS — R918 Other nonspecific abnormal finding of lung field: Secondary | ICD-10-CM

## 2014-04-22 DIAGNOSIS — E785 Hyperlipidemia, unspecified: Secondary | ICD-10-CM

## 2014-04-22 DIAGNOSIS — J449 Chronic obstructive pulmonary disease, unspecified: Secondary | ICD-10-CM

## 2014-04-22 DIAGNOSIS — I10 Essential (primary) hypertension: Secondary | ICD-10-CM

## 2014-04-22 LAB — BASIC METABOLIC PANEL
BUN: 13 mg/dL (ref 6–23)
CO2: 23 mEq/L (ref 19–32)
Calcium: 9.8 mg/dL (ref 8.4–10.5)
Chloride: 100 mEq/L (ref 96–112)
Creatinine, Ser: 0.82 mg/dL (ref 0.40–1.20)
GFR: 89.33 mL/min (ref >60.00)
Glucose, Bld: 125 mg/dL — ABNORMAL HIGH (ref 70–99)
Potassium: 3.6 mEq/L (ref 3.5–5.1)
Sodium: 135 mEq/L (ref 135–145)

## 2014-04-22 LAB — TSH: TSH: 1.23 u[IU]/mL (ref 0.35–4.50)

## 2014-04-22 LAB — CBC WITH DIFFERENTIAL/PLATELET
BASOS PCT: 0.3 % (ref 0.0–3.0)
Basophils Absolute: 0 10*3/uL (ref 0.0–0.1)
Eosinophils Absolute: 0.1 10*3/uL (ref 0.0–0.7)
Eosinophils Relative: 0.7 % (ref 0.0–5.0)
HCT: 40.8 % (ref 36.0–46.0)
Hemoglobin: 13.4 g/dL (ref 12.0–15.0)
LYMPHS PCT: 10.1 % — AB (ref 12.0–46.0)
Lymphs Abs: 1.5 10*3/uL (ref 0.7–4.0)
MCHC: 32.8 g/dL (ref 30.0–36.0)
MCV: 86.3 fl (ref 78.0–100.0)
MONO ABS: 1.1 10*3/uL — AB (ref 0.1–1.0)
Monocytes Relative: 6.9 % (ref 3.0–12.0)
Neutro Abs: 12.5 10*3/uL — ABNORMAL HIGH (ref 1.4–7.7)
Neutrophils Relative %: 82 % — ABNORMAL HIGH (ref 43.0–77.0)
PLATELETS: 520 10*3/uL — AB (ref 150.0–400.0)
RBC: 4.73 Mil/uL (ref 3.87–5.11)
RDW: 16.4 % — AB (ref 11.5–15.5)
WBC: 15.3 10*3/uL — AB (ref 4.0–10.5)

## 2014-04-22 LAB — BRAIN NATRIURETIC PEPTIDE: Pro B Natriuretic peptide (BNP): 21 pg/mL (ref 0.0–100.0)

## 2014-04-22 MED ORDER — ACETAMINOPHEN-CODEINE #3 300-30 MG PO TABS: ORAL_TABLET | ORAL | Status: DC

## 2014-04-22 NOTE — Assessment & Plan Note (Signed)
Labs ok, nothing else to offer

## 2014-04-22 NOTE — Assessment & Plan Note (Signed)
-   PFT's 10/05/11 FEV1  0.94 (46%) ratio 52 and 29% better p B2 - spirometry 01/21/2014 >   FEV1 0.97 (50%) ratio 61 p am Anororo  - spirometry 04/22/2014 >  FEV1  1.0 (56%) ratio 57 p am anoro prednisone 10 m  Technically she could probably have LULobectomy but given that it probably would not be curative and she'd have a tough time getting through it I would recommend she be presented at [facility] and go from there - she and her fm agree so will request first available and in meantime treat the airflow obst with anoro and asked her to restart the tylenol #3 q 4 prn sob or cough

## 2014-04-22 NOTE — Progress Notes (Signed)
Subjective:    Patient ID: Kelli Reyes, female    DOB: March 22, 1946   MRN: [id number]    Brief patient profile:  51 yobf quit smoking 1998 with onset of cough / sob in 1995 and not consistently better since so referred by Dr Ronnald Ramp 10/05/2011 to pulmonary clinic but lost to f/u until reconsulted 12/18/13 for lung mass with fob 12/24/13 NCG or LUL orifice suggestive of sarcoid.  History of Present Illness  10/05/2011 1st pulmonary eval cc chronic doe x over a decade min progressive x 2 -3 blocks some better since spiriva and mostly dry cough worse but improved on nasonex.   Typically wakes up about 3am nightly then takes spriva, does better p drinking water. No seasonal variability, h/o asthma or childhood allergies.  No  purulent sputum or sinus/hb symptoms on present rx. rec Continue prilosec 20 mg Take 30-60 min before first meal of the day and ADD Pepcid 20 mg one at bedtime GERD diet reviewed Please schedule a follow up office visit in 4- 6 weeks, call sooner if needed with PFT's .  11/16/2011 f/u ov/Kelli Reyes no show    12/18/2013 consultation/ Kelli Reyes / referred by Dr Alona Bene  Re abn cxr  Chief Complaint  Patient presents with  . Follow-up    Last seen in 2013. Pt states referred back by Dr Ronnald Ramp for eval of abnormal ct chest. She reports having increased wheezing and SOB recently. Gets out of breath walking approx 1 block.    indolent onset gradually worse sob x sev months/cough some better since started anoro,  No hemoptysis or purulent sputum rec   Thursday Oct 1  with   Bronchoscopy with biopsy> endobronchial swelling LUL > nl mucosa/ neg cytology on bal > rec 6 days of pred then return to regroup   01/07/2014 f/u ov/Kelli Reyes re:  Chief Complaint  Patient presents with  . Follow-up    Pt reports her breathing has improved some, but not back to normal baseline. No new co's today.   Transient improvement while on prednisone, then worse despite continuing anoro rec Continue anoro each am   Prednisone Take 4 for three days 3 for three days 2 for three days 1 for three days and stop  Please schedule a follow up office visit in   weeks, sooner if needed  Late add: with cxr and spirometry on return and consider repeat fob       01/21/2014 f/u ov/Kelli Reyes re:  Chief Complaint  Patient presents with  . Follow-up    CXR done today. Pt states breathing has been worse since the last visit. She states that she had some cramping in her left arm earlier this am.   rec PET next> c/w sarcoid can't r/o ca    03/04/2014 f/u ov/Kelli Reyes re: prob sarcoid Chief Complaint  Patient presents with  . Follow-up    Pt reports that her breathing has improved, but not back to her normal baseline. She has had fever on and off for the past wk and has prod cough with minimal clear sputum.   Not limited by breathing from desired activities   rec Prednisone 10 mg 2 each with bfast until return to office   04/01/2014 f/u ov/Kelli Reyes re: prob sarcoid, stopped pred 03/29/13 because ran out  Chief Complaint  Patient presents with  . Follow-up    Pt states her breathing is unchanged. She has had increased sputum production over the past wk- coughing up dark grey, blood tinged sputum.  says was doing some better on pred 20 until ran out then much worse with congested cough at hs and in am but no sob     - repeat fob 04/15/14 >> still swelling LUL orifice with cobblestoning R LL and tbbx / washings LUL >>> POS sq cell ca    04/22/2014 f/u ov/Kelli Reyes re: discuss options for lung ca LUL  Chief Complaint  Patient presents with  . Follow-up    Spirometry done today.  Pt states that her breathing is progressively worse since the last visit.     Doe x 50 ft and fatigued, poor cough control off tylenol #3 still on prednisone 10 mg daily plus anoro one puff each am    No obvious other patterns in day to day or daytime variabilty or assoc  cp or chest tightness, subjective wheeze overt sinus or hb symptoms. No unusual exp  hx or h/o childhood pna/ asthma or knowledge of premature birth.   Also denies any obvious fluctuation of symptoms with weather or environmental changes or other aggravating or alleviating factors except as outlined above   Current Medications, Allergies, Complete Past Medical History, Past Surgical History, Family History, and Social History were reviewed in [employer] record.  ROS  The following are not active complaints unless bolded sore throat, dysphagia, dental problems, itching, sneezing,  nasal congestion or excess/ purulent secretions, ear ache,   fever, chills, sweats, unintended wt loss, pleuritic or exertional cp, hemoptysis,  orthopnea pnd or leg swelling, presyncope, palpitations, heartburn, abdominal pain, anorexia, nausea, vomiting, diarrhea  or change in bowel or urinary habits, change in stools or urine, dysuria,hematuria,  rash, arthralgias, visual complaints, headache, numbness weakness or ataxia or problems with walking or coordination,  change in mood/affect or memory.                     Objective:  Physical Exam  amb bf nad  Wt 151 10/06/2011 >  12/18/13 142 > 01/07/2014 142 > 01/21/2014  145 >  03/04/2014 136 > 04/01/2014  145 > 04/22/2014  142   HEENT: nl dentition, turbinates, and orophanx. Nl external ear canals without cough reflex   NECK :  without JVD/Nodes/TM/ nl carotid upstrokes bilaterally   LUNGS: no acc muscle use, minimal bialteral insp /exp rhonchi L > R    CV:  RRR  no s3 or murmur or increase in P2, no edema   ABD:  soft and nontender with nl excursion in the supine position. No bruits or organomegaly, bowel sounds nl  MS:  warm without deformities, calf tenderness, cyanosis or clubbing  SKIN: warm and dry without lesions    NEURO:  alert, approp, no deficits       CXR PA and Lateral:   04/01/2014 : 1. Decreasing conspicuity of medial left upper lobe pulmonary mass suggesting interval response to therapy. 2.  Sequelae of prior granulomatous disease throughout the mediastinum and in the right lung. 3. Aortic atherosclerosis.       Recent Labs Lab 04/22/14 1426  NA 135  K 3.6  CL 100  CO2 23  BUN 13  CREATININE 0.82  GLUCOSE 125*    Recent Labs Lab 04/22/14 1426  HGB 13.4  HCT 40.8  WBC 15.3*  PLT 520.0*     Lab Results  Component Value Date   TSH 1.23 04/22/2014     Lab Results  Component Value Date   PROBNP 21.0 04/22/2014     Lab  Results  Component Value Date   ESRSEDRATE 97* 02/25/2014              Assessment & Plan:

## 2014-04-22 NOTE — Assessment & Plan Note (Signed)
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 NO ATYPIA OR MALIGNANCY  and cyt  neg > pred x 6 days symptoms better and cxr less prominent "mass" 01/07/2014 so rec prednisone x 12 days > f/u cxr 01/21/2014 increase mass  - PET 01/28/14 >Large left upper lobe paramediastinal intensely hypermetabolic mass coupled with hypermetabolic spleen and symmetric hypermetabolic hilar adenopathy.  ddx sarcoid/ lymphoma/ bronchogenic ca > pt informed 01/29/14 and feeling better, wants to wait until p tgivng for f/u - 03/04/2014 rx maint prednisone > some better 04/01/2014 but still sign vol loss on lateral view>  - repeat fob 04/15/14 >> still swelling LUL orifice with cobblestoning R LL and bx / washings LUL >>> POS sq cell ca  I had an extended discussion with the patient reviewing all relevant studies completed to date and  lasting 15 to 20 minutes of a 25 minute visit on the following ongoing concerns:   Discussed in detail all the  indications, usual  risks and alternatives  relative to the benefits with patient who agrees to proceed with [place] as really not a good candidate for even a LULobectomy and impossible to accurately stage her s tissue dx as she probably also has sarcoid

## 2014-04-22 NOTE — Assessment & Plan Note (Signed)
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  For now therefore I recommend the patient maintain  A floor of 5 mg prednisone per day

## 2014-04-22 NOTE — Patient Instructions (Signed)
Tylenol # 3 works for pain or breathlessness or cough up to 2 every 4 hours  Reduce prednisone to one half daily for now   Please see patient coordinator before you leave today  to schedule [place] [facility] asap   Please remember to go to the lab  department downstairs for your tests - we will call you with the results when they are available.

## 2014-04-23 ENCOUNTER — Other Ambulatory Visit: Payer: Self-pay | Admitting: *Deleted

## 2014-04-23 ENCOUNTER — Encounter: Payer: Self-pay | Admitting: *Deleted

## 2014-04-23 ENCOUNTER — Telehealth: Payer: Self-pay | Admitting: *Deleted

## 2014-04-23 DIAGNOSIS — R918 Other nonspecific abnormal finding of lung field: Secondary | ICD-10-CM

## 2014-04-23 NOTE — Progress Notes (Signed)
Quick Note:  Spoke with pt and notified of results per Dr. Wert. Pt verbalized understanding and denied any questions.  ______ 

## 2014-04-23 NOTE — [facility] Oncology Navigator Note (Unsigned)
Received referral on Kelli Reyes.  Noted she is a patient of Dr. Alvy Bimler.  I asked Tiffany to re-schedule with her.

## 2014-04-23 NOTE — Telephone Encounter (Signed)
Received referral on Kelli Reyes.  Patient is scheduled for [place] on 04/29/14 arrival at 3:00.  She verbalized understanding of appt time and place.

## 2014-04-26 ENCOUNTER — Telehealth: Payer: Self-pay | Admitting: Hematology and Oncology

## 2014-04-26 NOTE — Telephone Encounter (Signed)
SCHEDULED PT APPT. PER DR GORSUCH ON 04/29/14@9 :00. LEFT PT VM .

## 2014-04-29 ENCOUNTER — Telehealth: Payer: Self-pay | Admitting: *Deleted

## 2014-04-29 ENCOUNTER — Telehealth: Payer: Self-pay | Admitting: Internal Medicine

## 2014-04-29 ENCOUNTER — Encounter: Payer: Self-pay | Admitting: Hematology and Oncology

## 2014-04-29 ENCOUNTER — Ambulatory Visit: Payer: Medicare Other | Admitting: Internal Medicine

## 2014-04-29 ENCOUNTER — Telehealth: Payer: Self-pay | Admitting: Hematology and Oncology

## 2014-04-29 ENCOUNTER — Ambulatory Visit (HOSPITAL_BASED_OUTPATIENT_CLINIC_OR_DEPARTMENT_OTHER): Payer: PPO | Admitting: Hematology and Oncology

## 2014-04-29 VITALS — BP 113/101 | HR 119 | Temp 98.7°F | Resp 20 | Ht 62.0 in | Wt 144.0 lb

## 2014-04-29 DIAGNOSIS — C3412 Malignant neoplasm of upper lobe, left bronchus or lung: Secondary | ICD-10-CM

## 2014-04-29 DIAGNOSIS — D869 Sarcoidosis, unspecified: Secondary | ICD-10-CM | POA: Diagnosis not present

## 2014-04-29 MED ORDER — ONDANSETRON HCL 8 MG PO TABS: 8.0000 mg | ORAL_TABLET | Freq: Three times a day (TID) | ORAL | Status: DC | PRN

## 2014-04-29 MED ORDER — LIDOCAINE-PRILOCAINE 2.5-2.5 % EX CREA: TOPICAL_CREAM | CUTANEOUS | Status: DC

## 2014-04-29 MED ORDER — PROCHLORPERAZINE MALEATE 10 MG PO TABS: 10.0000 mg | ORAL_TABLET | Freq: Four times a day (QID) | ORAL | Status: DC | PRN

## 2014-04-29 NOTE — Telephone Encounter (Signed)
Pt called in and wanted to know if Dr Ronnald Ramp had any samples of Umeclidinium-Vilanterol [facility] ELLIPTA) 62.5-25 MCG/INH AEPB [[id number]]  She could have until she could get here in a couple weeks??     Best number [phone]

## 2014-04-29 NOTE — Telephone Encounter (Signed)
Prior Authorization for Zofran form placed in [place] box in [place].

## 2014-04-29 NOTE — Telephone Encounter (Signed)
yes

## 2014-04-29 NOTE — Telephone Encounter (Signed)
Per staff message and POF I have scheduled appts. Advised scheduler of appts. JMW  

## 2014-04-29 NOTE — Telephone Encounter (Signed)
Pt confirmed labs/ov per 02/04 POF, gave pt AVS.... KJ, sent msg to add chemo

## 2014-04-29 NOTE — Progress Notes (Signed)
 Tracks approved ondansetron 8mg  from 04/29/14-indefinitely

## 2014-04-30 NOTE — Progress Notes (Signed)
[facility] [facility] progress notes  Patient Care Team: Janith Lima, MD as PCP - General  CHIEF COMPLAINTS/PURPOSE OF VISIT:  Newly diagnosed lung cancer on background history of sarcoidosis and abnormal blood work  HISTORY OF PRESENTING ILLNESS:  Kelli Reyes 69 y.o. female was transferred to my care after her prior physician has left.  She was previously seen in 2013 by another physician for all nodules. Recently, she has extensive workup and was found to have lung cancer and was referred back here for further management. I reviewed the patient's records extensive and collaborated the history with the patient. Summary of her history is as follows:   Primary cancer of left upper lobe of lung   12/09/2013 Imaging CT scan of the scan showed abnormal soft tissue mass in the left upper lobe extending to the left hilum which is worrisome for primary pulmonarymalignancy, with stable findings consistent with previous granulomatous inflammation.   12/24/2013 Procedure She underwent bronchoscopy which showed no endoscopic lesion. Bronchoalveolar lavage was nondiagnostic.   01/28/2014 Imaging PET/CT scan showedLarge left upper lobe paramediastinal intensely hypermetabolic mass coupled with hypermetabolic spleen and symmetric hypermetabolichilar adenopathy. This is in a typical pattern for bronchogeniccarcinoma. 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   04/15/2014 Procedure Repeat bronchoscopy showed  Airways normal except for Mild cobblestoning bilaterally, conc narrowing AP segment LUL      She denies any recent cough except after the bronchoscopy. She had mild hemoptysis after bronchoscopy which subsequently resolved. She denies shortness of breath. No recent weight loss. MEDICAL HISTORY:  Past Medical History  Diagnosis Date  . COPD (chronic obstructive pulmonary  disease)   . GERD (gastroesophageal reflux disease)   . Hypertension   . Osteoarthritis   . Lung nodules     SURGICAL HISTORY: Past Surgical History  Procedure Laterality Date  . Abdominal hysterectomy    . Video bronchoscopy Bilateral 12/24/2013    Procedure: VIDEO BRONCHOSCOPY WITH FLUORO;  Surgeon: Tanda Rockers, MD;  Location: [facility];  Service: Cardiopulmonary;  Laterality: Bilateral;  . Video bronchoscopy Bilateral 04/15/2014    Procedure: VIDEO BRONCHOSCOPY WITH FLUORO;  Surgeon: Tanda Rockers, MD;  Location: [facility];  Service: Cardiopulmonary;  Laterality: Bilateral;    SOCIAL HISTORY: History   Social History  . Marital Status: Widowed    Spouse Name: N/A    Number of Children: 4  . Years of Education: N/A   Occupational History  . school bus driver     [place] History Main Topics  . Smoking status: Former Smoker -- 2.00 packs/day for 25 years    Types: Cigarettes    Quit date: 09/03/1996  . Smokeless tobacco: Never Used  . Alcohol Use: No     Comment: beer 2-3 times a week  . Drug Use: No  . Sexual Activity: Not Currently   Other Topics Concern  . Not on file   Social History Narrative   No regular exercise          FAMILY HISTORY: Family History  Problem Relation Age of Onset  . Hyperlipidemia Other   . Cancer Neg Hx   . Stroke Neg Hx   . Heart disease Neg Hx     ALLERGIES:  is allergic to ibuprofen.  MEDICATIONS:  Current Outpatient Prescriptions  Medication Sig Dispense Refill  . acetaminophen-codeine (TYLENOL #3) 300-30 MG per tablet  One every 4 hours as needed for cough 80 tablet 0  . amoxicillin-clavulanate (AUGMENTIN) 875-125 MG per tablet Take 1 tablet by mouth 2 (two) times daily. 20 tablet 0  . losartan-hydrochlorothiazide (HYZAAR) 50-12.5 MG per tablet Take 1 tablet by mouth daily.    . predniSONE (DELTASONE) 10 MG tablet Take 2 daily with bfast (Patient taking differently: Take 1 daily with bfast) 60  tablet 0  . ranitidine (ZANTAC) 150 MG tablet Take 1 tablet (150 mg total) by mouth 2 (two) times daily. 60 tablet 5  . traMADol (ULTRAM) 50 MG tablet Take 1 tablet (50 mg total) by mouth every 6 (six) hours as needed. 90 tablet 3  . Umeclidinium-Vilanterol (ANORO ELLIPTA) 62.5-25 MCG/INH AEPB Inhale 1 puff into the lungs daily. 30 each 11  . lidocaine-prilocaine (EMLA) cream Apply to affected area once 30 g 3  . ondansetron (ZOFRAN) 8 MG tablet Take 1 tablet (8 mg total) by mouth every 8 (eight) hours as needed. 30 tablet 1  . prochlorperazine (COMPAZINE) 10 MG tablet Take 1 tablet (10 mg total) by mouth every 6 (six) hours as needed (Nausea or vomiting). 30 tablet 1   No current facility-administered medications for this visit.    REVIEW OF SYSTEMS:   Constitutional: Denies fevers, chills or abnormal night sweats Eyes: Denies blurriness of vision, double vision or watery eyes Ears, nose, mouth, throat, and face: Denies mucositis or sore throat Cardiovascular: Denies palpitation, chest discomfort or lower extremity swelling Gastrointestinal:  Denies nausea, heartburn or change in bowel habits Skin: Denies abnormal skin rashes Lymphatics: Denies new lymphadenopathy or easy bruising Neurological:Denies numbness, tingling or new weaknesses Behavioral/Psych: Mood is stable, no new changes  All other systems were reviewed with the patient and are negative.  PHYSICAL EXAMINATION: ECOG PERFORMANCE STATUS: 1 - Symptomatic but completely ambulatory  Filed Vitals:   04/29/14 0919  BP: 113/101  Pulse: 119  Temp: 98.7 F (37.1 C)  Resp: 20   Filed Weights   04/29/14 0919  Weight: 144 lb (65.318 kg)    GENERAL:alert, no distress and comfortable SKIN: skin color, texture, turgor are normal, no rashes or significant lesions EYES: normal, conjunctiva are pink and non-injected, sclera clear PSYCH: alert & oriented x 3 with fluent speech NEURO: no focal motor/sensory deficits  LABORATORY  DATA:  I have reviewed the data as listed Lab Results  Component Value Date   WBC 15.3* 04/22/2014   HGB 13.4 04/22/2014   HCT 40.8 04/22/2014   MCV 86.3 04/22/2014   PLT 520.0* 04/22/2014    Recent Labs  09/28/13 1107 12/07/13 1505 01/08/14 1618 02/25/14 1149 04/22/14 1426  NA 138 139 136 139 135  K 3.6 2.8* 4.6 3.3* 3.6  CL 105 105 102 103 100  CO2 25 24 27 25 23   GLUCOSE 116* 81 247* 97 125*  BUN 13 7 16 8 13   CREATININE 0.9 0.9 1.2 0.9 0.82  CALCIUM 10.4 9.2 10.0 9.7 9.8  PROT 8.1 8.0  --  8.0  --   ALBUMIN 3.4* 2.9*  --  3.2*  --   AST 23 25  --  20  --   ALT 16 16  --  16  --   ALKPHOS 128* 138*  --  126*  --   BILITOT 0.4 0.3  --  0.6  --     RADIOGRAPHIC STUDIES: I have also reviewed all her last CT scan and PET CT scan with the patient and family I have personally reviewed  the radiological images as listed and agreed with the findings in the report. Dg Chest 2 View  04/01/2014   CLINICAL DATA:  69 year old female with left upper lobe mass  EXAM: CHEST  2 VIEW  COMPARISON:  Prior chest x-ray 03/04/2014; prior PET-CT 01/28/2014  FINDINGS: Decreasing conspicuity of the masslike opacity in the medial aspect of the left upper lobe compared to the relatively recent prior imaging. This suggests an interval response to therapy. Stable calcified granuloma and scarring in the inferior right upper lobe. No pneumothorax, pleural effusion or focal airspace consolidation. Calcified mediastinal and hilar lymph nodes. Atherosclerotic calcifications in the transverse aorta. The cardiac and mediastinal contours remain within normal limits. No acute osseous abnormality.  IMPRESSION: 1. Decreasing conspicuity of medial left upper lobe pulmonary mass suggesting interval response to therapy. 2. Sequelae of prior granulomatous disease throughout the mediastinum and in the right lung. 3. Aortic atherosclerosis.   Electronically Signed   By: Jacqulynn Cadet M.D.   On: 04/01/2014 12:08   Dg  C-arm Bronchoscopy  04/15/2014   CLINICAL DATA:    C-ARM BRONCHOSCOPY  Fluoroscopy was utilized by the requesting physician.  No radiographic  interpretation.     ASSESSMENT & PLAN:  Primary cancer of left upper lobe of lung I told the patient and family members to limitation of staging in her situation. With the concurrent presence of sarcoidosis, it costs grossly abnormal PET CT scan. Her spleen appears to be enlarged and hyperactive, could be related to sarcoidosis or lung cancer. We discussed the impossible situation of attempting splenic biopsy to stage her and exclude metastatic disease. Her case was presented at the most recent long conference and the patient is not a candidate for radiation therapy or surgery. I discussed with her that chemotherapy would be strictly palliative. She would need repeat staging scans with MR of the brain and PET/CT scan. I plan to see her back in 2 weeks to review test results and further discussion about plan of care. I recommend chemotherapy education class and placement of port. I will recommend starting her with combined treatment with carboplatin and paclitaxel.   Sarcoidosis She has history of sarcoidosis and was treated with prednisone. Some of the findings on PET CT scan are consistent with sarcoidosis but impossible to separate those findings from lung cancer.    Orders Placed This Encounter  Procedures  . NM PET Image Restag (PS) Skull Base To Thigh    Standing Status: Future     Number of Occurrences:      Standing Expiration Date: 06/29/2015    Order Specific Question:  Reason for Exam (SYMPTOM  OR DIAGNOSIS REQUIRED)    Answer:  staging lung ca    Order Specific Question:  Preferred imaging location?    Answer:  [facility]  . MR Brain W Contrast    Standing Status: Future     Number of Occurrences:      Standing Expiration Date: 06/29/2015    Order Specific Question:  Reason for Exam (SYMPTOM  OR DIAGNOSIS REQUIRED)     Answer:  staging lung ca    Order Specific Question:  Preferred imaging location?    Answer:  [facility]    Order Specific Question:  Does the patient have a pacemaker or implanted devices?    Answer:  No    Order Specific Question:  What is the patient's sedation requirement?    Answer:  No Sedation  . IR Fluoro Guide CV Line  Right    Indicate type of CVC ordering    Standing Status: Future     Number of Occurrences:      Standing Expiration Date: 06/28/2015    Order Specific Question:  Reason for exam:    Answer:  port for chemo    Order Specific Question:  Preferred Imaging Location?    Answer:  [facility]  . CBC with Differential    Standing Status: Standing     Number of Occurrences: 20     Standing Expiration Date: 04/30/2015  . Comprehensive metabolic panel    Standing Status: Standing     Number of Occurrences: 20     Standing Expiration Date: 04/30/2015    All questions were answered. The patient knows to call the clinic with any problems, questions or concerns. I spent 40 minutes counseling the patient face to face. The total time spent in the appointment was 60 minutes and more than 50% was on counseling.     Butler, [place], MD 04/30/2014 10:02 AM

## 2014-04-30 NOTE — Assessment & Plan Note (Addendum)
I told the patient and family members to limitation of staging in her situation. With the concurrent presence of sarcoidosis, it costs grossly abnormal PET CT scan. Her spleen appears to be enlarged and hyperactive, could be related to sarcoidosis or lung cancer. We discussed the impossible situation of attempting splenic biopsy to stage her and exclude metastatic disease. Her case was presented at the most recent long conference and the patient is not a candidate for radiation therapy or surgery. I discussed with her that chemotherapy would be strictly palliative. She would need repeat staging scans with MR of the brain and PET/CT scan. I plan to see her back in 2 weeks to review test results and further discussion about plan of care. I recommend chemotherapy education class and placement of port. I will recommend starting her with combined treatment with carboplatin and paclitaxel.

## 2014-04-30 NOTE — Assessment & Plan Note (Signed)
She has history of sarcoidosis and was treated with prednisone. Some of the findings on PET CT scan are consistent with sarcoidosis but impossible to separate those findings from lung cancer.

## 2014-04-30 NOTE — Telephone Encounter (Signed)
Called pt no answer LMOM will leave samples for pick-up...Kelli Reyes

## 2014-05-03 ENCOUNTER — Telehealth: Payer: Self-pay | Admitting: Hematology and Oncology

## 2014-05-03 NOTE — Telephone Encounter (Signed)
Faxed pt medical records to [place]

## 2014-05-04 ENCOUNTER — Other Ambulatory Visit: Payer: Self-pay | Admitting: Radiology

## 2014-05-06 ENCOUNTER — Telehealth: Payer: Self-pay | Admitting: *Deleted

## 2014-05-06 ENCOUNTER — Other Ambulatory Visit: Payer: Medicare Other

## 2014-05-06 NOTE — Telephone Encounter (Signed)
Pt requests Second Opinion at [facility].   States she needs referral from Dr. Alvy Bimler.  Informed pt we will make the referral today.   Pt says she is unsure if she will attend chemo education class today as scheduled.   Encouraged pt to attend class as she will learn about chemotherapy and regardless of where she ends up being treated, it will be good to attend the class.  Pt says she is not sure, but likely won't make it today.  She will wait to hear about appt at [facility].  Sent message to [employer]. In HIM and also informed her by phone of new referral.

## 2014-05-07 ENCOUNTER — Ambulatory Visit (HOSPITAL_COMMUNITY): Admission: RE | Admit: 2014-05-07 | Payer: BC Managed Care – PPO | Source: Ambulatory Visit

## 2014-05-07 ENCOUNTER — Inpatient Hospital Stay (HOSPITAL_COMMUNITY): Admission: RE | Admit: 2014-05-07 | Payer: Medicare Other | Source: Ambulatory Visit

## 2014-05-10 ENCOUNTER — Telehealth: Payer: Self-pay | Admitting: Internal Medicine

## 2014-05-10 LAB — FUNGUS CULTURE W SMEAR
Fungal Smear: NONE SEEN
SPECIAL REQUESTS: NORMAL

## 2014-05-10 NOTE — Telephone Encounter (Signed)
Called and spoke with pts daughter and she stated that they are not happy with Dr. Maurilio Lovely and would like a referral to [facility] for her lung cancer.  MW please advise if ok to send in new referral. thanks

## 2014-05-10 NOTE — Telephone Encounter (Signed)
[place] with me but I don't have any pull there and can't control their scheduling - I wish I could do more and sorry she feels that way about oncology here.

## 2014-05-10 NOTE — Telephone Encounter (Signed)
We can send out referral Does she want everything cancelled for this week?

## 2014-05-11 ENCOUNTER — Telehealth: Payer: Self-pay | Admitting: *Deleted

## 2014-05-11 NOTE — Telephone Encounter (Signed)
RECEIVED A CALL FROM PT.'S DAUGHTER,WAKEO, CONCERNING THE REFERRAL TO [facility]. INFORMED PT.'S DAUGHTER,WAKEO, OF THE MESSAGE LEFT ON HER CELL PHONE BY DR.GORSUCH'S NURSE, CAMEO [place],RN. FAMILY WILL CALL THIS AFTERNOON BEFORE 4:30PM OR EARLY TOMORROW MORNING CONCERNING THE APPOINTMENT WITH [facility].

## 2014-05-11 NOTE — Telephone Encounter (Signed)
Daughter called back and while we were speaking the phone hung up  I called her back at [phone] and she answered the phone and stated "I'm at work and don't have time" and hung up the phone

## 2014-05-11 NOTE — Telephone Encounter (Signed)
[place] x 1 for Kelli Reyes to let her know that Dr Maurilio Lovely referred pt to [facility] and not [facility] for 2nd opinion.  (See Phone note 05/10/14) Will leave message in triage.

## 2014-05-11 NOTE — Telephone Encounter (Signed)
LVM for pt's daughter, [name], on cell phone.  Informed her we are working on referral to [facility] and apologized for the delay.  Asked her to let us know if pt planning on keeping her appts here w/ Dr. Alvy Bimler tomorrow.  Please call us back and let us know.

## 2014-05-11 NOTE — Telephone Encounter (Signed)
[facility], calling to inform Kelli Reyes that they are putting referral in to [facility] today and that she apologizes for confusion. [phone]

## 2014-05-11 NOTE — Telephone Encounter (Signed)
Reviewed chart .  Per Epic oncology note from 05/06/14 Dr Calton Dach office was going to place order for 2nd opinion with [place] for Dr Calton Dach nurse to call us regarding placing this referral.  Also spoke with Fort[place] Sic and advised that  We are checking with Dr Calton Dach office regarding this referral and she should receive a call back from either [place] or Dr Calton Dach office.

## 2014-05-11 NOTE — Telephone Encounter (Signed)
LMTC x 1  

## 2014-05-11 NOTE — Telephone Encounter (Signed)
Called and spoke to Kelli Reyes. Referral has been placed by Cameo and is now waiting on the date of appt. Cameo stated she would call [facility] to inform her of the status. Nothing further needed at this time.

## 2014-05-11 NOTE — Telephone Encounter (Signed)
Per Phone message from Dr Calton Dach nurse: Kelli Reyes from [place] in Triage at Dr. Gustavus Bryant office. She says pt is asking about referral made by Dr. Alvy Bimler to [facility] last week. Referral was made and records sent to [facility] on 2/8 per pt request for second opinion. Pt has not heard about appt yet. I left VM for [name] In our Medical Record dept to please f/u on referral today for status. We will call pt's daughter, [name], when we know about appt..          Will  Close this message and refer to message from 05/11/14.

## 2014-05-11 NOTE — Telephone Encounter (Signed)
VM from [facility] in Triage at Dr. Gustavus Bryant office.  She says pt is asking about referral made by Dr. Alvy Bimler to [facility] last week.  Referral was made and records sent to [facility] on 2/8 per pt request for second opinion.  Pt has not heard about appt yet.   I left VM for [name] In our Medical Record dept to please f/u on referral today for status.   We will call pt's daughter, [name], when we know about appt.Marland Kitchen

## 2014-05-11 NOTE — Telephone Encounter (Signed)
Pt daughter rayanna matusik returned call  [phone] or call [phone]

## 2014-05-12 ENCOUNTER — Telehealth: Payer: Self-pay | Admitting: Hematology and Oncology

## 2014-05-12 ENCOUNTER — Other Ambulatory Visit: Payer: Medicare Other

## 2014-05-12 ENCOUNTER — Ambulatory Visit: Payer: Medicare Other

## 2014-05-12 ENCOUNTER — Ambulatory Visit: Payer: Medicare Other | Admitting: Hematology and Oncology

## 2014-05-12 NOTE — Telephone Encounter (Signed)
Pt appt. With Dr Carlis Abbott @ [facility] is 05/24/14@10 :00. Medical records faxed. Slides and scans will be fedex'ed.

## 2014-05-12 NOTE — Telephone Encounter (Signed)
Called [facility]-Michel[facility] Brancher left vm in ref to np referral

## 2014-05-13 ENCOUNTER — Other Ambulatory Visit: Payer: Self-pay | Admitting: Internal Medicine

## 2014-05-13 NOTE — Telephone Encounter (Signed)
Rx was printed and [facility]ed to [facility] [address]

## 2014-05-14 ENCOUNTER — Other Ambulatory Visit: Payer: Self-pay | Admitting: Hematology and Oncology

## 2014-05-14 ENCOUNTER — Telehealth: Payer: Self-pay | Admitting: *Deleted

## 2014-05-14 DIAGNOSIS — C3412 Malignant neoplasm of upper lobe, left bronchus or lung: Secondary | ICD-10-CM

## 2014-05-14 MED ORDER — OXYCODONE HCL 5 MG PO TABS: 5.0000 mg | ORAL_TABLET | ORAL | Status: DC | PRN

## 2014-05-14 NOTE — Telephone Encounter (Signed)
Pt asks if she can be rescheduled to have her PAC and chemo class so she will be ready to start chemo after she sees MD at [facility]?

## 2014-05-14 NOTE — Telephone Encounter (Signed)
Roz, RN in [place] received call from [place], patient's friend, stating that patient is having increasing pain and was wondering if patient needs a prescription for something for the pain or needs to be evaluated at [facility] today.   Called and spoke directly with patient. She states she is having throbbing pain under her left breast. Rates it 6/10 now. Patient states she took Tylenol #3 and it has helped some. Per patient, the pain started last week but she has not taken anything for it until last night. Inquired why she has not taken anything until now or called- she states "I don't know, maybe I am in denial and thought it would go away." Patient denies shortness of breath or any other symptoms. States she has had pain in her stomach as well but not any at the moment. Told patient I will pass this along to Dr. Alvy Bimler and call her back.

## 2014-05-14 NOTE — Telephone Encounter (Signed)
Informed pt of Rx for oxycodone from Dr. Alvy Bimler ready to pick up.  Informed her Dr. Alvy Bimler willing to give her one time Rx until she can see MD at [facility] on 2/29.  Discussed w/ pt that unfortunately, her pain is from her cancer and it is not surprising per Dr. Alvy Bimler.   Pt discussed some frustration at how long it took to get referral to [facility].  Apologized to pt and suggested she call them to see if it can be moved up.  Pt also questioning whether she needs to see Dr. Alvy Bimler before she goes to [facility]?  Pt had appt w/ Dr. Alvy Bimler earlier this week, but did not show up.  Informed pt that if pt wants to start treatment (chemotherapy) here then we can get her rescheduled, but otherwise she should get her second opinion first.  Call us and let us know what she wants to do.  Pt verbalized understanding.

## 2014-05-14 NOTE — Telephone Encounter (Signed)
Sure. Please reschedule. I will also see her back on 3/2 to review Rx recommendations

## 2014-05-14 NOTE — Telephone Encounter (Signed)
Left VM for pt informing we will r/s her for [facility] placement and chemo education class, so hopefully she can have that done before she goes to [place] on 2/29.  Informed her to expect a call from [place].

## 2014-05-17 ENCOUNTER — Telehealth: Payer: Self-pay | Admitting: *Deleted

## 2014-05-17 ENCOUNTER — Telehealth: Payer: Self-pay | Admitting: Hematology and Oncology

## 2014-05-17 NOTE — Telephone Encounter (Signed)
returned call and lvm for pt regarding to calling IR to confirm appt

## 2014-05-17 NOTE — Telephone Encounter (Signed)
Radiology left message stating patient wants to schedule PAC placement after she goes to [facility]

## 2014-05-19 ENCOUNTER — Ambulatory Visit: Payer: Medicare Other

## 2014-05-24 ENCOUNTER — Other Ambulatory Visit: Payer: Self-pay | Admitting: Radiology

## 2014-05-24 ENCOUNTER — Telehealth: Payer: Self-pay | Admitting: *Deleted

## 2014-05-24 MED ORDER — DIAZEPAM 5 MG PO TABS: 5.0000 mg | ORAL_TABLET | Freq: Two times a day (BID) | ORAL | Status: DC | PRN

## 2014-05-24 NOTE — Telephone Encounter (Signed)
Left MSG on triage stating she is schedule to have a MRI on Wednesday wanting md to rx something for her to take to calm her down before procedure...Johny Chess

## 2014-05-24 NOTE — Telephone Encounter (Signed)
See Rx 

## 2014-05-25 ENCOUNTER — Telehealth: Payer: Self-pay | Admitting: Hematology and Oncology

## 2014-05-25 NOTE — Telephone Encounter (Signed)
Pt notified to pick up script

## 2014-05-25 NOTE — Telephone Encounter (Signed)
Billy from [facility] [phone] call to cx port placement for pt...pt is seeking 2nd opinion

## 2014-05-27 ENCOUNTER — Inpatient Hospital Stay (HOSPITAL_COMMUNITY): Admission: RE | Admit: 2014-05-27 | Payer: Medicare Other | Source: Ambulatory Visit

## 2014-05-27 ENCOUNTER — Ambulatory Visit (HOSPITAL_COMMUNITY): Admission: RE | Admit: 2014-05-27 | Payer: Medicare Other | Source: Ambulatory Visit

## 2014-05-28 LAB — AFB CULTURE WITH SMEAR (NOT AT ARMC)
Acid Fast Smear: NONE SEEN
Special Requests: NORMAL

## 2014-06-22 ENCOUNTER — Other Ambulatory Visit: Payer: Self-pay | Admitting: Hematology and Oncology

## 2014-06-23 ENCOUNTER — Other Ambulatory Visit: Payer: Self-pay | Admitting: Internal Medicine

## 2014-06-24 NOTE — Telephone Encounter (Signed)
We can refill it this time It looks as if Dr Melvyn Novas has not scheduled a f/u visit. She would need to be seen by him again before we could order any beyond this time, or she needs to be looking to her primary MD in the future.

## 2014-06-24 NOTE — Telephone Encounter (Signed)
Pt states she is completely out of the Tylenol #3 and Dr. Melvyn Novas isn't back until 06/28/14. Please advise if we can refill prescription request.

## 2014-06-24 NOTE — Telephone Encounter (Signed)
RX called into [facility], spoke with Cyril Mourning, for the Tylenol #3, approved by Dr. Annamaria Boots. Pt informed she must make appt with Dr. Melvyn Novas or PCP before any further refills on this medication per Dr.Young.

## 2014-07-05 ENCOUNTER — Emergency Department (HOSPITAL_COMMUNITY): Payer: PPO

## 2014-07-05 ENCOUNTER — Emergency Department (HOSPITAL_COMMUNITY)
Admission: EM | Admit: 2014-07-05 | Discharge: 2014-07-06 | Disposition: A | Discharge status: Home / Self Care | Payer: PPO | Attending: Emergency Medicine | Admitting: Emergency Medicine

## 2014-07-05 DIAGNOSIS — I10 Essential (primary) hypertension: Secondary | ICD-10-CM | POA: Diagnosis not present

## 2014-07-05 DIAGNOSIS — Z7952 Long term (current) use of systemic steroids: Secondary | ICD-10-CM | POA: Insufficient documentation

## 2014-07-05 DIAGNOSIS — M1990 Unspecified osteoarthritis, unspecified site: Secondary | ICD-10-CM | POA: Diagnosis not present

## 2014-07-05 DIAGNOSIS — Z792 Long term (current) use of antibiotics: Secondary | ICD-10-CM | POA: Diagnosis not present

## 2014-07-05 DIAGNOSIS — C3412 Malignant neoplasm of upper lobe, left bronchus or lung: Secondary | ICD-10-CM | POA: Diagnosis not present

## 2014-07-05 DIAGNOSIS — Z79899 Other long term (current) drug therapy: Secondary | ICD-10-CM | POA: Diagnosis not present

## 2014-07-05 DIAGNOSIS — R0602 Shortness of breath: Secondary | ICD-10-CM

## 2014-07-05 DIAGNOSIS — Z87891 Personal history of nicotine dependence: Secondary | ICD-10-CM | POA: Insufficient documentation

## 2014-07-05 DIAGNOSIS — K219 Gastro-esophageal reflux disease without esophagitis: Secondary | ICD-10-CM | POA: Insufficient documentation

## 2014-07-05 DIAGNOSIS — J441 Chronic obstructive pulmonary disease with (acute) exacerbation: Secondary | ICD-10-CM | POA: Insufficient documentation

## 2014-07-05 DIAGNOSIS — Z7951 Long term (current) use of inhaled steroids: Secondary | ICD-10-CM | POA: Insufficient documentation

## 2014-07-05 LAB — CBC WITH DIFFERENTIAL/PLATELET
Basophils Absolute: 0 10*3/uL (ref 0.0–0.1)
Basophils Relative: 0 % (ref 0–1)
EOS PCT: 0 % (ref 0–5)
Eosinophils Absolute: 0 10*3/uL (ref 0.0–0.7)
HCT: 38.1 % (ref 36.0–46.0)
Hemoglobin: 11.8 g/dL — ABNORMAL LOW (ref 12.0–15.0)
LYMPHS ABS: 1.4 10*3/uL (ref 0.7–4.0)
Lymphocytes Relative: 8 % — ABNORMAL LOW (ref 12–46)
MCH: 26.7 pg (ref 26.0–34.0)
MCHC: 31 g/dL (ref 30.0–36.0)
MCV: 86.2 fL (ref 78.0–100.0)
Monocytes Absolute: 0.1 10*3/uL (ref 0.1–1.0)
Monocytes Relative: 1 % — ABNORMAL LOW (ref 3–12)
Neutro Abs: 16.2 10*3/uL — ABNORMAL HIGH (ref 1.7–7.7)
Neutrophils Relative %: 91 % — ABNORMAL HIGH (ref 43–77)
Platelets: 430 10*3/uL — ABNORMAL HIGH (ref 150–400)
RBC: 4.42 MIL/uL (ref 3.87–5.11)
RDW: 15.3 % (ref 11.5–15.5)
WBC: 17.7 10*3/uL — AB (ref 4.0–10.5)

## 2014-07-05 LAB — I-STAT TROPONIN, ED
Troponin i, poc: 0 ng/mL (ref 0.00–0.08)
Troponin i, poc: 0 ng/mL (ref 0.00–0.08)

## 2014-07-05 LAB — COMPREHENSIVE METABOLIC PANEL
ALBUMIN: 2.8 g/dL — AB (ref 3.5–5.2)
ALK PHOS: 232 U/L — AB (ref 39–117)
ALT: 22 U/L (ref 0–35)
AST: 16 U/L (ref 0–37)
Anion gap: 11 (ref 5–15)
BILIRUBIN TOTAL: 0.5 mg/dL (ref 0.3–1.2)
BUN: 21 mg/dL (ref 6–23)
CO2: 24 mmol/L (ref 19–32)
Calcium: 9.7 mg/dL (ref 8.4–10.5)
Chloride: 101 mmol/L (ref 96–112)
Creatinine, Ser: 0.81 mg/dL (ref 0.50–1.10)
GFR calc Af Amer: 85 mL/min — ABNORMAL LOW (ref >90)
GFR calc non Af Amer: 73 mL/min — ABNORMAL LOW (ref >90)
GLUCOSE: 171 mg/dL — AB (ref 70–99)
POTASSIUM: 4.3 mmol/L (ref 3.5–5.1)
Sodium: 136 mmol/L (ref 135–145)
Total Protein: 7.7 g/dL (ref 6.0–8.3)

## 2014-07-05 LAB — URINALYSIS, ROUTINE W REFLEX MICROSCOPIC
BILIRUBIN URINE: NEGATIVE
Glucose, UA: NEGATIVE mg/dL
Hgb urine dipstick: NEGATIVE
KETONES UR: NEGATIVE mg/dL
Leukocytes, UA: NEGATIVE
NITRITE: NEGATIVE
PH: 7 (ref 5.0–8.0)
Protein, ur: NEGATIVE mg/dL
Specific Gravity, Urine: 1.013 (ref 1.005–1.030)
Urobilinogen, UA: 1 mg/dL (ref 0.0–1.0)

## 2014-07-05 LAB — D-DIMER, QUANTITATIVE: D-Dimer, Quant: 1.87 ug/mL-FEU — ABNORMAL HIGH (ref 0.00–0.48)

## 2014-07-05 LAB — BRAIN NATRIURETIC PEPTIDE: B Natriuretic Peptide: 127.6 pg/mL — ABNORMAL HIGH (ref 0.0–100.0)

## 2014-07-05 LAB — I-STAT CG4 LACTIC ACID, ED: Lactic Acid, Venous: 1.94 mmol/L (ref 0.5–2.0)

## 2014-07-05 MED ORDER — ONDANSETRON HCL 4 MG/2ML IJ SOLN
4.0000 mg | Freq: Once | INTRAMUSCULAR | Status: AC
  Administered 2014-07-06: 4 mg via INTRAVENOUS
  Filled 2014-07-05: qty 2

## 2014-07-05 MED ORDER — LEVOFLOXACIN 750 MG PO TABS: 750.0000 mg | ORAL_TABLET | Freq: Every day | ORAL | Status: DC

## 2014-07-05 MED ORDER — IOHEXOL 350 MG/ML SOLN
100.0000 mL | Freq: Once | INTRAVENOUS | Status: AC | PRN
  Administered 2014-07-05: 100 mL via INTRAVENOUS

## 2014-07-05 MED ORDER — HYDROMORPHONE HCL 1 MG/ML IJ SOLN
1.0000 mg | Freq: Once | INTRAMUSCULAR | Status: AC
Start: 2014-07-06 — End: 2014-07-06
  Administered 2014-07-06: 1 mg via INTRAVENOUS
  Filled 2014-07-05: qty 1

## 2014-07-05 MED ORDER — OXYCODONE-ACETAMINOPHEN 5-325 MG PO TABS: 2.0000 | ORAL_TABLET | ORAL | Status: DC | PRN

## 2014-07-05 NOTE — ED Provider Notes (Addendum)
CSN: [id number]     Arrival date & time 07/05/14  1659 History   First MD Initiated Contact with Patient 07/05/14 1735     Chief Complaint  Patient presents with  . ca pt, SOB      (Consider location/radiation/quality/duration/timing/severity/associated sxs/prior Treatment) HPI Comments: Patient presents to the ER for evaluation of chest pain and shortness of breath. Patient reports that she had heaviness in her chest and shortness of breath while running errands earlier today. Patient reports that she has noticed in the last couple of months that she has gotten symptoms of shortness of breath and heaviness in her chest when she exerts herself. The symptoms improve when she rests. She reports that when she noticed the symptoms today, she went home and laid down, symptoms have resolved. She talked to her oncologist at [facility] and was told to come to the ER to be seen. Patient just initiated treatment for lung cancer this past week, having had her first chemotherapy.   Past Medical History  Diagnosis Date  . COPD (chronic obstructive pulmonary disease)   . GERD (gastroesophageal reflux disease)   . Hypertension   . Osteoarthritis   . Lung nodules    Past Surgical History  Procedure Laterality Date  . Abdominal hysterectomy    . Video bronchoscopy Bilateral 12/24/2013    Procedure: VIDEO BRONCHOSCOPY WITH FLUORO;  Surgeon: Tanda Rockers, MD;  Location: [facility];  Service: Cardiopulmonary;  Laterality: Bilateral;  . Video bronchoscopy Bilateral 04/15/2014    Procedure: VIDEO BRONCHOSCOPY WITH FLUORO;  Surgeon: Tanda Rockers, MD;  Location: [facility];  Service: Cardiopulmonary;  Laterality: Bilateral;   Family History  Problem Relation Age of Onset  . Hyperlipidemia Other   . Cancer Neg Hx   . Stroke Neg Hx   . Heart disease Neg Hx    History  Substance Use Topics  . Smoking status: Former Smoker -- 2.00 packs/day for 25 years    Types: Cigarettes    Quit date: 09/03/1996  .  Smokeless tobacco: Never Used  . Alcohol Use: No     Comment: beer 2-3 times a week   OB History    No data available     Review of Systems  Respiratory: Positive for chest tightness and shortness of breath.   Cardiovascular: Positive for chest pain.  All other systems reviewed and are negative.     Allergies  Ibuprofen  Home Medications   Prior to Admission medications   Medication Sig Start Date End Date Taking? Authorizing Provider  acetaminophen-codeine (TYLENOL #3) 300-30 MG per tablet TAKE 1 TABLET BY MOUTH EVERY 4 HOURS AS NEEDED FOR COUGH 06/24/14  Yes Deneise Lever, MD  beclomethasone (QVAR) 80 MCG/ACT inhaler Inhale 2 puffs into the lungs 2 (two) times daily.   Yes Historical Provider, MD  docusate sodium (COLACE) 100 MG capsule Take 100 mg by mouth 2 (two) times daily.   Yes Historical Provider, MD  ipratropium (ATROVENT HFA) 17 MCG/ACT inhaler Inhale 2 puffs into the lungs every 6 (six) hours as needed for wheezing.   Yes Historical Provider, MD  losartan-hydrochlorothiazide (HYZAAR) 50-12.5 MG per tablet Take 1 tablet by mouth every morning.    Yes Historical Provider, MD  ondansetron (ZOFRAN) 8 MG tablet Take 1 tablet (8 mg total) by mouth every 8 (eight) hours as needed. Patient taking differently: Take 8 mg by mouth every 8 (eight) hours as needed for nausea or vomiting.  04/29/14  Yes Heath Lark, MD  predniSONE (DELTASONE) 10 MG tablet Take 2 daily with bfast Patient taking differently: Take 5 mg by mouth every morning. TAKES 1/2 TABLET 04/01/14  Yes Tanda Rockers, MD  PRESCRIPTION MEDICATION    Yes Historical Provider, MD  prochlorperazine (COMPAZINE) 10 MG tablet Take 10 mg by mouth every 6 (six) hours as needed for nausea or vomiting.   Yes Historical Provider, MD  amoxicillin-clavulanate (AUGMENTIN) 875-125 MG per tablet Take 1 tablet by mouth 2 (two) times daily. Patient not taking: Reported on 05/04/2014 04/01/14   Tanda Rockers, MD  diazepam (VALIUM) 5 MG tablet  Take 1 tablet (5 mg total) by mouth every 12 (twelve) hours as needed for anxiety. Patient not taking: Reported on 07/05/2014 05/24/14   Janith Lima, MD  oxyCODONE (OXY IR/ROXICODONE) 5 MG immediate release tablet Take 1 tablet (5 mg total) by mouth every 4 (four) hours as needed for severe pain. Patient not taking: Reported on 07/05/2014 05/14/14   Heath Lark, MD  Potassium Chloride CR (MICRO-K) 8 MEQ CPCR capsule CR Take 16 mEq by mouth 2 (two) times daily.    Historical Provider, MD  ranitidine (ZANTAC) 150 MG tablet Take 1 tablet (150 mg total) by mouth 2 (two) times daily. Patient not taking: Reported on 07/05/2014 03/29/14   Mosie Lukes, MD  traMADol (ULTRAM) 50 MG tablet Take 1 tablet (50 mg total) by mouth every 6 (six) hours as needed. Patient not taking: Reported on 05/04/2014 03/15/14   Janith Lima, MD  Umeclidinium-Vilanterol [facility] ELLIPTA) 62.5-25 MCG/INH AEPB Inhale 1 puff into the lungs daily. Patient not taking: Reported on 07/05/2014 09/28/13   Janith Lima, MD   BP 147/81 mmHg  Pulse 105  Temp(Src) 98.5 F (36.9 C) (Oral)  Resp 29  SpO2 95% Physical Exam  Constitutional: She is oriented to person, place, and time. She appears well-developed and well-nourished. No distress.  HENT:  Head: Normocephalic and atraumatic.  Right Ear: Hearing normal.  Left Ear: Hearing normal.  Nose: Nose normal.  Mouth/Throat: Oropharynx is clear and moist and mucous membranes are normal.  Eyes: Conjunctivae and EOM are normal. Pupils are equal, round, and reactive to light.  Neck: Normal range of motion. Neck supple.  Cardiovascular: Regular rhythm, S1 normal and S2 normal.  Exam reveals no gallop and no friction rub.   No murmur heard. Pulmonary/Chest: Effort normal and breath sounds normal. No respiratory distress. She exhibits no tenderness.  Abdominal: Soft. Normal appearance and bowel sounds are normal. There is no hepatosplenomegaly. There is no tenderness. There is no rebound, no  guarding, no tenderness at McBurney's point and negative Murphy's sign. No hernia.  Musculoskeletal: Normal range of motion.  Neurological: She is alert and oriented to person, place, and time. She has normal strength. No cranial nerve deficit or sensory deficit. Coordination normal. GCS eye subscore is 4. GCS verbal subscore is 5. GCS motor subscore is 6.  Skin: Skin is warm, dry and intact. No rash noted. No cyanosis.  Psychiatric: She has a normal mood and affect. Her speech is normal and behavior is normal. Thought content normal.  Nursing note and vitals reviewed.   ED Course  Procedures (including critical care time) Labs Review Labs Reviewed  CBC WITH DIFFERENTIAL/PLATELET - Abnormal; Notable for the following:    WBC 17.7 (*)    Hemoglobin 11.8 (*)    Platelets 430 (*)    Neutrophils Relative % 91 (*)    Neutro Abs 16.2 (*)  Lymphocytes Relative 8 (*)    Monocytes Relative 1 (*)    All other components within normal limits  COMPREHENSIVE METABOLIC PANEL  URINALYSIS, ROUTINE W REFLEX MICROSCOPIC  BRAIN NATRIURETIC PEPTIDE  D-DIMER, QUANTITATIVE  I-STAT CG4 LACTIC ACID, ED  Randolm Idol, ED    Imaging Review Dg Chest 2 View  07/05/2014   CLINICAL DATA:  Shortness of breath. Cough and weakness. Currently under treatment for lung cancer.  EXAM: CHEST  2 VIEW  COMPARISON:  04/01/2014  FINDINGS: The left upper lobe mass is larger, currently about 7.8 by 7.0 cm, formerly about 6.6 by 5.9 cm.  Old granulomatous disease.  Scarring along the minor fissure.  Heart size within normal limits.  The lungs appear otherwise clear.  IMPRESSION: 1. Enlarging left upper lobe mass compatible with lung cancer.   Electronically Signed   By: Van Clines M.D.   On: 07/05/2014 17:35     EKG Interpretation   Date/Time:  Monday July 05 2014 17:07:56 EDT Ventricular Rate:  112 PR Interval:  103 QRS Duration: 57 QT Interval:  310 QTC Calculation: 423 R Axis:   68 Text  Interpretation:  Sinus tachycardia Ventricular trigeminy Baseline  wander in lead(s) II No significant change since last tracing Confirmed by  POLLINA  MD, CHRISTOPHER [phone]) on 07/05/2014 6:08:36 PM      MDM   Final diagnoses:  SOB (shortness of breath)    Patient presents to the ER for evaluation of shortness of breath and chest discomfort. Patient was told to come to the ER by her oncologist because she has active lung cancer and oncologist was concerned about the possibility of blood clot. Patient has spiked a low-grade fever here in the ER, etiology is unclear. There is no evidence of pneumonia. Remainder of workup was unremarkable including urine. Will send urine and blood cultures.   CT angiography did not reveal any evidence of PE. She has a large necrotic left upper lobe mass. There may be some superinfection involved. Remainder of workup was unremarkable. I did perform a second troponin. This does not appear to be cardiac pain. I suspect she has experienced shortness of breath pain secondary to the mass effect. She'll be treated with increased analgesia, Levaquin, she has follow-up with her oncologist this week.    Orpah Greek, MD 07/05/14 2354  Orpah Greek, MD 07/06/14 [id number]

## 2014-07-05 NOTE — ED Notes (Signed)
Pt reports currently being treated for lung cancer at [facility], started chemo on Friday. This morning started having SOB and chest pain. Pain 8/10. Able to speak in full sentences.

## 2014-07-05 NOTE — ED Notes (Signed)
Bed: WA03 Expected date:  Expected time:  Means of arrival:  Comments: NV flank pain

## 2014-07-05 NOTE — ED Notes (Signed)
IV team unable to get IV access.

## 2014-07-05 NOTE — Discharge Instructions (Signed)
Lung Cancer Lung cancer is an abnormal growth of cells in one or both of your lungs. These extra cells may form a mass of tissue called a growth or tumor. Tumors can be either cancerous (malignant) or not cancerous (benign).  Lung cancer is the most common cause of cancer death in men and women. There are several different types of lung cancers. Usually, lung cancer is described as either small cell lung cancer or nonsmall cell lung cancer. Other types of cancer occur in the lungs, including carcinoid and cancers spread from other organs. The types of cancer have different behavior and treatment. RISK FACTORS Smoking is the most common risk factor for developing lung cancer. Other risk factors include:  Radon gas exposure.  Asbestos and other industrial substance exposure.  Second hand tobacco smoke.  Air pollution.  Family or personal history of lung cancer.  Age older than 60 years. CAUSES  Lung cancer usually starts when the lungs are exposed to harmful chemicals. Smoking is the most common risk factor for lung cancer. When you quit smoking, your risk of lung cancer falls each year (but is never the same as a person who has never smoked).  SYMPTOMS  Lung cancer may not have any symptoms in its early stages. The symptoms can depend on the type of cancer, its location, and other factors. Symptoms can include:  Cough (either new, different, or more severe).  Shortness of breath.  Coughing up blood (hemoptysis).  Chest pain.  Hoarseness.  Swelling of the face.  Drooping eyelid.  Changes in blood tests, such as low sodium (hyponatremia), high calcium (hypercalcemia), or low blood count (anemia).  Weight loss. DIAGNOSIS  Your health care provider may suspect lung cancer based on your symptoms or based on tests obtained for other reasons. Tests or procedures used to find or confirm the presence of lung cancer may include:  Chest X-ray.  CT scan of the lungs and chest.  Blood  tests.  Taking a tissue sample (biopsy) from your lung to look for cancer cells. Your cancer will be staged to determine its severity and extent. Staging is a careful attempt to find out the size of the tumor, whether the cancer has spread, and if so, to what parts of the body. You may need to have more tests to determine the stage of your cancer. The test results will help determine what treatment plan is best for you.   Stage 0--This is the earliest stage of lung cancer. In this stage the tumor is present in only a few layers of cells and has not grown beyond the inner lining of the lungs. Stage 0 (carcinoma in situ) is considered noninvasive, meaning at this stage it is not yet capable of spreading to other regions.  Stage I-- The cancer is located only in the lungs and not spread to any lymph nodes.  Stage II--The cancer is in the lungs and the nearby lymph nodes.  Stage III--The cancer is in the lungs and the lymph nodes in the middle of the chest. This is also called locally advanced disease. This stage has two subtypes:  Stage IIIa - The cancer has spread only to lymph nodes on the same side of the chest where the cancer started.  Stage IIIb - The cancer has spread to lymph nodes on the opposite side of the chest or above the collar bone.  Stage IV-- This is the most advanced stage of lung cancer and is also called advanced disease. This  stage describes when the cancer has spread to both lungs, the fluid in the area around the lungs, or to another body part. Your health care provider may tell you the detailed stage of your cancer, which includes both a number and a letter.  TREATMENT  Depending on the type and stage, lung cancer may be treated with surgery, radiation therapy, chemotherapy, or targeted therapy. Some people have a combination of these therapies. Your treatment plan will be developed by your health care team.  [place] not smoke.  Only take  over-the-counter or prescription medicines for pain, discomfort, or fever as directed by your health care provider.  Maintain a healthy diet.  Consider joining a support group. This may help you learn to cope with the stress of having lung cancer.  Seek advice to help you manage treatment side effects.  Keep all follow-up appointments as directed by your health care provider.  Inform your cancer specialist if you are admitted to the hospital. [place] IF:   You are losing weight without trying.  You have a persistent cough.  You feel short of breath.  You tire easily. SEEK IMMEDIATE MEDICAL CARE IF:   You cough up clotted blood or bright red blood.  Your pain is not manageable or controlled by medicine.  You develop new difficulty breathing or chest pain.  You develop swelling in one or both ankles or legs, or swelling in your face or neck.  You develop headache or confusion. Document Released: 06/18/2000 Document Revised: 12/31/2012 Document Reviewed: 07/16/2013 [facility] Patient Information 2015 [place], [place]. This information is not intended to replace advice given to you by your health care provider. Make sure you discuss any questions you have with your health care provider.

## 2014-07-06 MED ORDER — ACETAMINOPHEN 325 MG PO TABS
650.0000 mg | ORAL_TABLET | Freq: Once | ORAL | Status: AC
  Administered 2014-07-06: 650 mg via ORAL
  Filled 2014-07-06: qty 2

## 2014-07-06 NOTE — ED Notes (Signed)
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

## 2014-07-06 NOTE — ED Notes (Signed)
Unable to obtain 2nd set of blood cultures due to pt refusing to be stuck. MD aware/

## 2014-07-09 LAB — URINE CULTURE: Colony Count: >=100000

## 2014-07-12 ENCOUNTER — Telehealth (HOSPITAL_COMMUNITY): Payer: Self-pay

## 2014-07-12 LAB — CULTURE, BLOOD (ROUTINE X 2): CULTURE: NO GROWTH

## 2014-07-12 NOTE — Telephone Encounter (Signed)
Post ED Visit - Positive Culture Follow-up  Culture report reviewed by antimicrobial stewardship pharmacist: []  Wes Dulaney, Pharm.D., BCPS []  Heide Guile, Pharm.D., BCPS []  Alycia Rossetti, Pharm.D., BCPS [x]  [place], Pharm.D., BCPS, AAHIVP []  Legrand Como, Pharm.D., BCPS, AAHIVP []  Isac Sarna, Pharm.D., BCPS  Positive Urine culture, >/= 100,000 colonies -> staph Species Treated with Levofloxacin, organism sensitive to the same and no further patient follow-up is required at this time.  Dortha Kern 07/12/2014, 3:35 AM

## 2014-08-10 ENCOUNTER — Other Ambulatory Visit: Payer: Self-pay

## 2014-08-10 MED ORDER — LOSARTAN POTASSIUM-HCTZ 50-12.5 MG PO TABS: 1.0000 | ORAL_TABLET | Freq: Every morning | ORAL | Status: DC

## 2014-09-21 ENCOUNTER — Encounter (HOSPITAL_COMMUNITY): Payer: Self-pay | Admitting: Emergency Medicine

## 2014-09-21 ENCOUNTER — Emergency Department (HOSPITAL_COMMUNITY)
Admission: EM | Admit: 2014-09-21 | Discharge: 2014-09-21 | Disposition: A | Discharge status: Home / Self Care | Payer: PPO | Attending: Emergency Medicine | Admitting: Emergency Medicine

## 2014-09-21 DIAGNOSIS — Z87891 Personal history of nicotine dependence: Secondary | ICD-10-CM | POA: Diagnosis not present

## 2014-09-21 DIAGNOSIS — R42 Dizziness and giddiness: Secondary | ICD-10-CM | POA: Insufficient documentation

## 2014-09-21 DIAGNOSIS — Z79899 Other long term (current) drug therapy: Secondary | ICD-10-CM | POA: Insufficient documentation

## 2014-09-21 DIAGNOSIS — M1990 Unspecified osteoarthritis, unspecified site: Secondary | ICD-10-CM | POA: Insufficient documentation

## 2014-09-21 DIAGNOSIS — K219 Gastro-esophageal reflux disease without esophagitis: Secondary | ICD-10-CM | POA: Insufficient documentation

## 2014-09-21 DIAGNOSIS — D649 Anemia, unspecified: Secondary | ICD-10-CM | POA: Diagnosis not present

## 2014-09-21 DIAGNOSIS — I10 Essential (primary) hypertension: Secondary | ICD-10-CM | POA: Insufficient documentation

## 2014-09-21 DIAGNOSIS — J449 Chronic obstructive pulmonary disease, unspecified: Secondary | ICD-10-CM | POA: Insufficient documentation

## 2014-09-21 DIAGNOSIS — Z7951 Long term (current) use of inhaled steroids: Secondary | ICD-10-CM | POA: Insufficient documentation

## 2014-09-21 DIAGNOSIS — R000 Tachycardia, unspecified: Secondary | ICD-10-CM | POA: Insufficient documentation

## 2014-09-21 DIAGNOSIS — R55 Syncope and collapse: Secondary | ICD-10-CM | POA: Diagnosis not present

## 2014-09-21 DIAGNOSIS — Z7952 Long term (current) use of systemic steroids: Secondary | ICD-10-CM | POA: Diagnosis not present

## 2014-09-21 DIAGNOSIS — Z85118 Personal history of other malignant neoplasm of bronchus and lung: Secondary | ICD-10-CM | POA: Insufficient documentation

## 2014-09-21 LAB — BASIC METABOLIC PANEL
Anion gap: 14 (ref 5–15)
BUN: 19 mg/dL (ref 6–20)
CALCIUM: 9.6 mg/dL (ref 8.9–10.3)
CO2: 25 mmol/L (ref 22–32)
Chloride: 100 mmol/L — ABNORMAL LOW (ref 101–111)
Creatinine, Ser: 1.11 mg/dL — ABNORMAL HIGH (ref 0.44–1.00)
GFR calc Af Amer: 58 mL/min — ABNORMAL LOW (ref >60)
GFR calc non Af Amer: 50 mL/min — ABNORMAL LOW (ref >60)
GLUCOSE: 216 mg/dL — AB (ref 65–99)
Potassium: 3.1 mmol/L — ABNORMAL LOW (ref 3.5–5.1)
Sodium: 139 mmol/L (ref 135–145)

## 2014-09-21 LAB — CBC
HCT: 32 % — ABNORMAL LOW (ref 36.0–46.0)
HEMOGLOBIN: 9.8 g/dL — AB (ref 12.0–15.0)
MCH: 28.7 pg (ref 26.0–34.0)
MCHC: 30.6 g/dL (ref 30.0–36.0)
MCV: 93.8 fL (ref 78.0–100.0)
Platelets: 632 10*3/uL — ABNORMAL HIGH (ref 150–400)
RBC: 3.41 MIL/uL — ABNORMAL LOW (ref 3.87–5.11)
RDW: 19.6 % — ABNORMAL HIGH (ref 11.5–15.5)
WBC: 19.9 10*3/uL — ABNORMAL HIGH (ref 4.0–10.5)

## 2014-09-21 LAB — CBG MONITORING, ED: Glucose-Capillary: 162 mg/dL — ABNORMAL HIGH (ref 65–99)

## 2014-09-21 LAB — TROPONIN I

## 2014-09-21 LAB — POC OCCULT BLOOD, ED: Fecal Occult Bld: NEGATIVE

## 2014-09-21 NOTE — ED Notes (Signed)
Pt complaint of dizziness and tremor resulting in "passing out;" sister in law helped to floor; no fall/hit. Pt denies symptoms or pain at present time.

## 2014-09-21 NOTE — ED Provider Notes (Signed)
CSN: [id number]     Arrival date & time 09/21/14  1431 History   First MD Initiated Contact with Patient 09/21/14 1503     Chief Complaint  Patient presents with  . Loss of Consciousness     (Consider location/radiation/quality/duration/timing/severity/associated sxs/prior Treatment) HPI Patient sitting in chair getting hair done. Started to feel lightheaded. Stood to check food in [employer], hadn't eaten yet. More dizzy going to oven. Friend noted slight staggering in gait, when bent down to look in oven strarted falling backwards. Friend rushed to assistance and caught her before fall. No injury. Brief LOC the back to normal MS. Denies CP, H/A or other assoc. Sx accept weak, dizzy. Patient has a history of lung cancer and has finished chemotherapy about 2 weeks ago. She reports that she has been doing well. She has recheck with her oncologist tomorrow. She denies she's been experiencing any chest pain, palpitations, lower extremity edema or pain. He has not had fever or general illness. Past Medical History  Diagnosis Date  . COPD (chronic obstructive pulmonary disease)   . GERD (gastroesophageal reflux disease)   . Hypertension   . Osteoarthritis   . Lung nodules    Past Surgical History  Procedure Laterality Date  . Abdominal hysterectomy    . Video bronchoscopy Bilateral 12/24/2013    Procedure: VIDEO BRONCHOSCOPY WITH FLUORO;  Surgeon: Tanda Rockers, MD;  Location: [facility];  Service: Cardiopulmonary;  Laterality: Bilateral;  . Video bronchoscopy Bilateral 04/15/2014    Procedure: VIDEO BRONCHOSCOPY WITH FLUORO;  Surgeon: Tanda Rockers, MD;  Location: [facility];  Service: Cardiopulmonary;  Laterality: Bilateral;   Family History  Problem Relation Age of Onset  . Hyperlipidemia Other   . Cancer Neg Hx   . Stroke Neg Hx   . Heart disease Neg Hx    History  Substance Use Topics  . Smoking status: Former Smoker -- 2.00 packs/day for 25 years    Types: Cigarettes    Quit  date: 09/03/1996  . Smokeless tobacco: Never Used  . Alcohol Use: No     Comment: beer 2-3 times a week   OB History    No data available     Review of Systems 10 Systems reviewed and are negative for acute change except as noted in the HPI.   Allergies  Ibuprofen  Home Medications   Prior to Admission medications   Medication Sig Start Date End Date Taking? Authorizing Provider  albuterol (PROVENTIL HFA;VENTOLIN HFA) 108 (90 BASE) MCG/ACT inhaler Inhale 2 puffs into the lungs every 6 (six) hours as needed for wheezing or shortness of breath.   Yes Historical Provider, MD  beclomethasone (QVAR) 80 MCG/ACT inhaler Inhale 2 puffs into the lungs 2 (two) times daily.   Yes Historical Provider, MD  dexamethasone (DECADRON) 4 MG tablet Take 8 mg by mouth daily. For 2 days after chemo   Yes Historical Provider, MD  docusate sodium (COLACE) 100 MG capsule Take 100 mg by mouth 2 (two) times daily.   Yes Historical Provider, MD  ipratropium (ATROVENT HFA) 17 MCG/ACT inhaler Inhale 2 puffs into the lungs every 6 (six) hours as needed for wheezing.   Yes Historical Provider, MD  losartan-hydrochlorothiazide (HYZAAR) 50-12.5 MG per tablet Take 1 tablet by mouth every morning. 08/10/14  Yes Janith Lima, MD  mirtazapine (REMERON) 15 MG tablet Take 15 mg by mouth at bedtime.   Yes Historical Provider, MD  morphine (MS CONTIN) 15 MG 12 hr tablet Take 15  mg by mouth every 12 (twelve) hours.   Yes Historical Provider, MD  omeprazole (PRILOSEC) 20 MG capsule Take 20 mg by mouth every evening.   Yes Historical Provider, MD  oxyCODONE (OXY IR/ROXICODONE) 5 MG immediate release tablet Take 5 mg by mouth every 4 (four) hours as needed for severe pain.   Yes Historical Provider, MD  predniSONE (DELTASONE) 10 MG tablet Take 10 mg by mouth daily with breakfast.   Yes Historical Provider, MD  PRESCRIPTION MEDICATION    Yes Historical Provider, MD  prochlorperazine (COMPAZINE) 10 MG tablet Take 10 mg by mouth  every 6 (six) hours as needed for nausea or vomiting.   Yes Historical Provider, MD   BP 114/79 mmHg  Pulse 112  Temp(Src) 98.9 F (37.2 C) (Oral)  Resp 22  SpO2 96% Physical Exam  Constitutional: She is oriented to person, place, and time. She appears well-developed and well-nourished.  HENT:  Head: Normocephalic and atraumatic.  Eyes: EOM are normal. Pupils are equal, round, and reactive to light.  Neck: Neck supple.  Cardiovascular: Normal rate, regular rhythm, normal heart sounds and intact distal pulses.   tachycardia  Pulmonary/Chest: Effort normal and breath sounds normal.  Abdominal: Soft. Bowel sounds are normal. She exhibits no distension. There is no tenderness.  Musculoskeletal: Normal range of motion. She exhibits no edema or tenderness.  Neurological: She is alert and oriented to person, place, and time. She has normal strength. Coordination normal. GCS eye subscore is 4. GCS verbal subscore is 5. GCS motor subscore is 6.  Skin: Skin is warm, dry and intact.  Psychiatric: She has a normal mood and affect.   Rectal, soft brown stool in the vault. No melanoma. ED Course  Procedures (including critical care time) Labs Review Labs Reviewed  BASIC METABOLIC PANEL - Abnormal; Notable for the following:    Potassium 3.1 (*)    Chloride 100 (*)    Glucose, Bld 216 (*)    Creatinine, Ser 1.11 (*)    GFR calc non Af Amer 50 (*)    GFR calc Af Amer 58 (*)    All other components within normal limits  CBC - Abnormal; Notable for the following:    WBC 19.9 (*)    RBC 3.41 (*)    Hemoglobin 9.8 (*)    HCT 32.0 (*)    RDW 19.6 (*)    Platelets 632 (*)    All other components within normal limits  CBG MONITORING, ED - Abnormal; Notable for the following:    Glucose-Capillary 162 (*)    All other components within normal limits  TROPONIN I  POC OCCULT BLOOD, ED    Imaging Review No results found.   EKG Interpretation   Date/Time:  Tuesday September 21 2014 14:47:29  EDT Ventricular Rate:  116 PR Interval:  114 QRS Duration: 62 QT Interval:  340 QTC Calculation: 472 R Axis:   48 Text Interpretation:  Sinus tachycardia Borderline T abnormalities,  anterior leads Baseline wander in lead(s) V5 agree. no sig change  Confirmed by Johnney Killian, MD, Jeannie Done [phone]) on 09/21/2014 3:05:27 PM      MDM   Final diagnoses:  Syncope and collapse  Orthostatic dizziness  Anemia, unspecified anemia type  History of lung cancer   Patient is asymptomatic upon arrival. She had not eaten today prior to this episode. She reports that she was getting a little lightheaded and was getting up to get food from the oven. Positive and her lightheadedness became worse  and she staggered and ended up having a syncopal episode but no injury. At this point the patient does not appear to have acute complications of her cancer. She has finished her chemotherapy and is not experiencing increasing chest pain or palpitation or fever. She has not had lower extremity swelling or calf pain to suggest DVT. She has follow-up with her oncologist tomorrow. She is advised any worsening or changing symptoms to return otherwise follow up tomorrow as planned.    Charlesetta Shanks, MD 09/21/14 [phone]

## 2014-09-21 NOTE — ED Notes (Signed)
Per Pfeiffer plan to discharge pt; Pfeiffer aware pt VS and reports continue to discharge with completion of discharge paperwork.

## 2014-09-21 NOTE — Discharge Instructions (Signed)
Syncope °Syncope is a medical term for fainting or passing out. This means you lose consciousness and drop to the ground. People are generally unconscious for less than 5 minutes. You may have some muscle twitches for up to 15 seconds before waking up and returning to normal. Syncope occurs more often in older adults, but it can happen to anyone. While most causes of syncope are not dangerous, syncope can be a sign of a serious medical problem. It is important to seek medical care.  °CAUSES  °Syncope is caused by a sudden drop in blood flow to the brain. The specific cause is often not determined. Factors that can bring on syncope include: °· Taking medicines that lower blood pressure. °· Sudden changes in posture, such as standing up quickly. °· Taking more medicine than prescribed. °· Standing in one place for too long. °· Seizure disorders. °· Dehydration and excessive exposure to heat. °· Low blood sugar (hypoglycemia). °· Straining to have a bowel movement. °· Heart disease, irregular heartbeat, or other circulatory problems. °· Fear, emotional distress, seeing blood, or severe pain. °SYMPTOMS  °Right before fainting, you may: °· Feel dizzy or light-headed. °· Feel nauseous. °· See all white or all black in your field of vision. °· Have cold, clammy skin. °DIAGNOSIS  °Your health care provider will ask about your symptoms, perform a physical exam, and perform an electrocardiogram (ECG) to record the electrical activity of your heart. Your health care provider may also perform other heart or blood tests to determine the cause of your syncope which may include: °· Transthoracic echocardiogram (TTE). During echocardiography, sound waves are used to evaluate how blood flows through your heart. °· Transesophageal echocardiogram (TEE). °· Cardiac monitoring. This allows your health care provider to monitor your heart rate and rhythm in real time. °· Holter monitor. This is a portable device that records your  heartbeat and can help diagnose heart arrhythmias. It allows your health care provider to track your heart activity for several days, if needed. °· Stress tests by exercise or by giving medicine that makes the heart beat faster. °TREATMENT  °In most cases, no treatment is needed. Depending on the cause of your syncope, your health care provider may recommend changing or stopping some of your medicines. °HOME CARE INSTRUCTIONS °· Have someone stay with you until you feel stable. °· Do not drive, use machinery, or play sports until your health care provider says it is okay. °· Keep all follow-up appointments as directed by your health care provider. °· Lie down right away if you start feeling like you might faint. Breathe deeply and steadily. Wait until all the symptoms have passed. °· Drink enough fluids to keep your urine clear or pale yellow. °· If you are taking blood pressure or heart medicine, get up slowly and take several minutes to sit and then stand. This can reduce dizziness. °SEEK IMMEDIATE MEDICAL CARE IF:  °· You have a severe headache. °· You have unusual pain in the chest, abdomen, or back. °· You are bleeding from your mouth or rectum, or you have black or tarry stool. °· You have an irregular or very fast heartbeat. °· You have pain with breathing. °· You have repeated fainting or seizure-like jerking during an episode. °· You faint when sitting or lying down. °· You have confusion. °· You have trouble walking. °· You have severe weakness. °· You have vision problems. °If you fainted, call your local emergency services (911 in [place] Do not drive   yourself to the hospital.  MAKE SURE YOU:  Understand these instructions.  Will watch your condition.  Will get help right away if you are not doing well or get worse. Document Released: 03/12/2005 Document Revised: 03/17/2013 Document Reviewed: 05/11/2011 [facility] Patient Information 2015 [place], [place]. This information is not intended to replace  advice given to you by your health care provider. Make sure you discuss any questions you have with your health care provider.  Anemia, Nonspecific Anemia is a condition in which the concentration of red blood cells or hemoglobin in the blood is below normal. Hemoglobin is a substance in red blood cells that carries oxygen to the tissues of the body. Anemia results in not enough oxygen reaching these tissues.  CAUSES  Common causes of anemia include:   Excessive bleeding. Bleeding may be internal or external. This includes excessive bleeding from periods (in women) or from the intestine.   Poor nutrition.   Chronic kidney, thyroid, and liver disease.  Bone marrow disorders that decrease red blood cell production.  Cancer and treatments for cancer.  HIV, AIDS, and their treatments.  Spleen problems that increase red blood cell destruction.  Blood disorders.  Excess destruction of red blood cells due to infection, medicines, and autoimmune disorders. SIGNS AND SYMPTOMS   Minor weakness.   Dizziness.   Headache.  Palpitations.   Shortness of breath, especially with exercise.   Paleness.  Cold sensitivity.  Indigestion.  Nausea.  Difficulty sleeping.  Difficulty concentrating. Symptoms may occur suddenly or they may develop slowly.  DIAGNOSIS  Additional blood tests are often needed. These help your health care provider determine the best treatment. Your health care provider will check your stool for blood and look for other causes of blood loss.  TREATMENT  Treatment varies depending on the cause of the anemia. Treatment can include:   Supplements of iron, vitamin H29, or folic acid.   Hormone medicines.   A blood transfusion. This may be needed if blood loss is severe.   Hospitalization. This may be needed if there is significant continual blood loss.   Dietary changes.  Spleen removal. HOME CARE INSTRUCTIONS Keep all follow-up appointments.  It often takes many weeks to correct anemia, and having your health care provider check on your condition and your response to treatment is very important. SEEK IMMEDIATE MEDICAL CARE IF:   You develop extreme weakness, shortness of breath, or chest pain.   You become dizzy or have trouble concentrating.  You develop heavy vaginal bleeding.   You develop a rash.   You have bloody or black, tarry stools.   You faint.   You vomit up blood.   You vomit repeatedly.   You have abdominal pain.  You have a fever or persistent symptoms for more than 2-3 days.   You have a fever and your symptoms suddenly get worse.   You are dehydrated.  MAKE SURE YOU:  Understand these instructions.  Will watch your condition.  Will get help right away if you are not doing well or get worse. Document Released: 04/19/2004 Document Revised: 11/12/2012 Document Reviewed: 09/05/2012 [facility] Patient Information 2015 [place], [place]. This information is not intended to replace advice given to you by your health care provider. Make sure you discuss any questions you have with your health care provider.

## 2014-09-21 NOTE — ED Notes (Signed)
Two unsuccessful IV attempts by this RN; Denisa and Orma Flaming attempt to draw labs unsuccessful. IV team consult in place at present time for IV/blood draw.

## 2014-09-21 NOTE — ED Notes (Signed)
IV team at bedside 

## 2014-09-27 ENCOUNTER — Emergency Department (HOSPITAL_COMMUNITY): Payer: PPO

## 2014-09-27 ENCOUNTER — Encounter (HOSPITAL_COMMUNITY): Payer: Self-pay | Admitting: Emergency Medicine

## 2014-09-27 ENCOUNTER — Emergency Department (HOSPITAL_COMMUNITY)
Admission: EM | Admit: 2014-09-27 | Discharge: 2014-09-27 | Disposition: A | Discharge status: Home / Self Care | Payer: PPO | Attending: Emergency Medicine | Admitting: Emergency Medicine

## 2014-09-27 DIAGNOSIS — R002 Palpitations: Secondary | ICD-10-CM | POA: Diagnosis present

## 2014-09-27 DIAGNOSIS — Z87891 Personal history of nicotine dependence: Secondary | ICD-10-CM | POA: Diagnosis not present

## 2014-09-27 DIAGNOSIS — Z79899 Other long term (current) drug therapy: Secondary | ICD-10-CM | POA: Insufficient documentation

## 2014-09-27 DIAGNOSIS — K219 Gastro-esophageal reflux disease without esophagitis: Secondary | ICD-10-CM | POA: Insufficient documentation

## 2014-09-27 DIAGNOSIS — Z862 Personal history of diseases of the blood and blood-forming organs and certain disorders involving the immune mechanism: Secondary | ICD-10-CM | POA: Insufficient documentation

## 2014-09-27 DIAGNOSIS — I10 Essential (primary) hypertension: Secondary | ICD-10-CM | POA: Diagnosis not present

## 2014-09-27 DIAGNOSIS — Z8739 Personal history of other diseases of the musculoskeletal system and connective tissue: Secondary | ICD-10-CM | POA: Insufficient documentation

## 2014-09-27 DIAGNOSIS — J449 Chronic obstructive pulmonary disease, unspecified: Secondary | ICD-10-CM | POA: Diagnosis not present

## 2014-09-27 DIAGNOSIS — R55 Syncope and collapse: Secondary | ICD-10-CM | POA: Diagnosis not present

## 2014-09-27 DIAGNOSIS — R000 Tachycardia, unspecified: Secondary | ICD-10-CM | POA: Insufficient documentation

## 2014-09-27 DIAGNOSIS — C3490 Malignant neoplasm of unspecified part of unspecified bronchus or lung: Secondary | ICD-10-CM | POA: Insufficient documentation

## 2014-09-27 LAB — BASIC METABOLIC PANEL
Anion gap: 11 (ref 5–15)
BUN: 16 mg/dL (ref 6–20)
CO2: 25 mmol/L (ref 22–32)
Calcium: 9.6 mg/dL (ref 8.9–10.3)
Chloride: 101 mmol/L (ref 101–111)
Creatinine, Ser: 1.09 mg/dL — ABNORMAL HIGH (ref 0.44–1.00)
GFR, EST AFRICAN AMERICAN: 59 mL/min — AB (ref >60)
GFR, EST NON AFRICAN AMERICAN: 51 mL/min — AB (ref >60)
Glucose, Bld: 216 mg/dL — ABNORMAL HIGH (ref 65–99)
Potassium: 3.1 mmol/L — ABNORMAL LOW (ref 3.5–5.1)
SODIUM: 137 mmol/L (ref 135–145)

## 2014-09-27 LAB — URINALYSIS, ROUTINE W REFLEX MICROSCOPIC
Bilirubin Urine: NEGATIVE
Glucose, UA: NEGATIVE mg/dL
Hgb urine dipstick: NEGATIVE
Ketones, ur: NEGATIVE mg/dL
LEUKOCYTES UA: NEGATIVE
Nitrite: NEGATIVE
Protein, ur: NEGATIVE mg/dL
Specific Gravity, Urine: 1.013 (ref 1.005–1.030)
UROBILINOGEN UA: 1 mg/dL (ref 0.0–1.0)
pH: 6.5 (ref 5.0–8.0)

## 2014-09-27 LAB — BRAIN NATRIURETIC PEPTIDE: B NATRIURETIC PEPTIDE 5: 31.6 pg/mL (ref 0.0–100.0)

## 2014-09-27 LAB — I-STAT TROPONIN, ED: Troponin i, poc: 0.01 ng/mL (ref 0.00–0.08)

## 2014-09-27 LAB — CBC
HEMATOCRIT: 30.2 % — AB (ref 36.0–46.0)
Hemoglobin: 9.2 g/dL — ABNORMAL LOW (ref 12.0–15.0)
MCH: 28.3 pg (ref 26.0–34.0)
MCHC: 30.5 g/dL (ref 30.0–36.0)
MCV: 92.9 fL (ref 78.0–100.0)
PLATELETS: 603 10*3/uL — AB (ref 150–400)
RBC: 3.25 MIL/uL — AB (ref 3.87–5.11)
RDW: 18.5 % — AB (ref 11.5–15.5)
WBC: 22.1 10*3/uL — AB (ref 4.0–10.5)

## 2014-09-27 LAB — TYPE AND SCREEN
ABO/RH(D): O POS
Antibody Screen: NEGATIVE

## 2014-09-27 LAB — ABO/RH: ABO/RH(D): O POS

## 2014-09-27 LAB — I-STAT CG4 LACTIC ACID, ED: LACTIC ACID, VENOUS: 2.39 mmol/L — AB (ref 0.5–2.0)

## 2014-09-27 MED ORDER — IOHEXOL 350 MG/ML SOLN
100.0000 mL | Freq: Once | INTRAVENOUS | Status: AC | PRN
  Administered 2014-09-27: 100 mL via INTRAVENOUS

## 2014-09-27 MED ORDER — SODIUM CHLORIDE 0.9 % IV BOLUS (SEPSIS)
1000.0000 mL | Freq: Once | INTRAVENOUS | Status: AC
  Administered 2014-09-27: 1000 mL via INTRAVENOUS

## 2014-09-27 NOTE — Discharge Instructions (Signed)
We saw you in the ER for the shortness of breath and near fainting. All the results in the ER are normal, labs and imaging. We are not sure what is causing your symptoms. The workup in the ER is not complete, and is limited to screening for life threatening and emergent conditions only, so please see a primary care doctor for further evaluation.   Cardiac Event Monitoring A cardiac event monitor is a small recording device used to help detect abnormal heart rhythms (arrhythmias). The monitor is used to record heart rhythm when noticeable symptoms such as the following occur:  Fast heartbeats (palpitations), such as heart racing or fluttering.  Dizziness.  Fainting or light-headedness.  Unexplained weakness. The monitor is wired to two electrodes placed on your chest. Electrodes are flat, sticky disks that attach to your skin. The monitor can be worn for up to 30 days. You will wear the monitor at all times, except when bathing.  HOW TO USE YOUR CARDIAC EVENT MONITOR A technician will prepare your chest for the electrode placement. The technician will show you how to place the electrodes, how to work the monitor, and how to replace the batteries. Take time to practice using the monitor before you leave the office. Make sure you understand how to send the information from the monitor to your health care provider. This requires a telephone with a landline, not a cell phone. You need to:  Wear your monitor at all times, except when you are in water:  Do not get the monitor wet.  Take the monitor off when bathing. Do not swim or use a hot tub with it on.  Keep your skin clean. Do not put body lotion or moisturizer on your chest.  Change the electrodes daily or any time they stop sticking to your skin. You might need to use tape to keep them on.  It is possible that your skin under the electrodes could become irritated. To keep this from happening, try to put the electrodes in slightly  different places on your chest. However, they must remain in the area under your left breast and in the upper right section of your chest.  Make sure the monitor is safely clipped to your clothing or in a location close to your body that your health care provider recommends.  Press the button to record when you feel symptoms of heart trouble, such as dizziness, weakness, light-headedness, palpitations, thumping, shortness of breath, unexplained weakness, or a fluttering or racing heart. The monitor is always on and records what happened slightly before you pressed the button, so do not worry about being too late to get good information.  Keep a diary of your activities, such as walking, doing chores, and taking medicine. It is especially important to note what you were doing when you pushed the button to record your symptoms. This will help your health care provider determine what might be contributing to your symptoms. The information stored in your monitor will be reviewed by your health care provider alongside your diary entries.  Send the recorded information as recommended by your health care provider. It is important to understand that it will take some time for your health care provider to process the results.  Change the batteries as recommended by your health care provider. SEEK IMMEDIATE MEDICAL CARE IF:   You have chest pain.  You have extreme difficulty breathing or shortness of breath.  You develop a very fast heartbeat that persists.  You develop dizziness  that does not go away.  You faint or constantly feel you are about to faint. Document Released: 12/20/2007 Document Revised: 07/27/2013 Document Reviewed: 09/08/2012 [facility] Patient Information 2015 [place], [place]. This information is not intended to replace advice given to you by your health care provider. Make sure you discuss any questions you have with your health care provider.  Near-Syncope Near-syncope (commonly  known as near fainting) is sudden weakness, dizziness, or feeling like you might pass out. During an episode of near-syncope, you may also develop pale skin, have tunnel vision, or feel sick to your stomach (nauseous). Near-syncope may occur when getting up after sitting or while standing for a long time. It is caused by a sudden decrease in blood flow to the brain. This decrease can result from various causes or triggers, most of which are not serious. However, because near-syncope can sometimes be a sign of something serious, a medical evaluation is required. The specific cause is often not determined. HOME CARE INSTRUCTIONS  Monitor your condition for any changes. The following actions may help to alleviate any discomfort you are experiencing:  Have someone stay with you until you feel stable.  Lie down right away and prop your feet up if you start feeling like you might faint. Breathe deeply and steadily. Wait until all the symptoms have passed. Most of these episodes last only a few minutes. You may feel tired for several hours.   Drink enough fluids to keep your urine clear or pale yellow.   If you are taking blood pressure or heart medicine, get up slowly when seated or lying down. Take several minutes to sit and then stand. This can reduce dizziness.  Follow up with your health care provider as directed. SEEK IMMEDIATE MEDICAL CARE IF:   You have a severe headache.   You have unusual pain in the chest, abdomen, or back.   You are bleeding from the mouth or rectum, or you have black or tarry stool.   You have an irregular or very fast heartbeat.   You have repeated fainting or have seizure-like jerking during an episode.   You faint when sitting or lying down.   You have confusion.   You have difficulty walking.   You have severe weakness.   You have vision problems.  MAKE SURE YOU:   Understand these instructions.  Will watch your condition.  Will get help  right away if you are not doing well or get worse. Document Released: 03/12/2005 Document Revised: 03/17/2013 Document Reviewed: 08/15/2012 [facility] Patient Information 2015 [place], [place]. This information is not intended to replace advice given to you by your health care provider. Make sure you discuss any questions you have with your health care provider. Palpitations A palpitation is the feeling that your heartbeat is irregular or is faster than normal. It may feel like your heart is fluttering or skipping a beat. Palpitations are usually not a serious problem. However, in some cases, you may need further medical evaluation. CAUSES  Palpitations can be caused by:  Smoking.  Caffeine or other stimulants, such as diet pills or energy drinks.  Alcohol.  Stress and anxiety.  Strenuous physical activity.  Fatigue.  Certain medicines.  Heart disease, especially if you have a history of irregular heart rhythms (arrhythmias), such as atrial fibrillation, atrial flutter, or supraventricular tachycardia.  An improperly working pacemaker or defibrillator. DIAGNOSIS  To find the cause of your palpitations, your health care provider will take your medical history and perform a  physical exam. Your health care provider may also have you take a test called an ambulatory electrocardiogram (ECG). An ECG records your heartbeat patterns over a 24-hour period. You may also have other tests, such as:  Transthoracic echocardiogram (TTE). During echocardiography, sound waves are used to evaluate how blood flows through your heart.  Transesophageal echocardiogram (TEE).  Cardiac monitoring. This allows your health care provider to monitor your heart rate and rhythm in real time.  Holter monitor. This is a portable device that records your heartbeat and can help diagnose heart arrhythmias. It allows your health care provider to track your heart activity for several days, if needed.  Stress tests by  exercise or by giving medicine that makes the heart beat faster. TREATMENT  Treatment of palpitations depends on the cause of your symptoms and can vary greatly. Most cases of palpitations do not require any treatment other than time, relaxation, and monitoring your symptoms. Other causes, such as atrial fibrillation, atrial flutter, or supraventricular tachycardia, usually require further treatment. HOME CARE INSTRUCTIONS   Avoid:  Caffeinated coffee, tea, soft drinks, diet pills, and energy drinks.  Chocolate.  Alcohol.  Stop smoking if you smoke.  Reduce your stress and anxiety. Things that can help you relax include:  A method of controlling things in your body, such as your heartbeats, with your mind (biofeedback).  Yoga.  Meditation.  Physical activity such as swimming, jogging, or walking.  Get plenty of rest and sleep. SEEK MEDICAL CARE IF:   You continue to have a fast or irregular heartbeat beyond 24 hours.  Your palpitations occur more often. SEEK IMMEDIATE MEDICAL CARE IF:  You have chest pain or shortness of breath.  You have a severe headache.  You feel dizzy or you faint. MAKE SURE YOU:  Understand these instructions.  Will watch your condition.  Will get help right away if you are not doing well or get worse. Document Released: 03/09/2000 Document Revised: 03/17/2013 Document Reviewed: 05/11/2011 [facility] Patient Information 2015 [place], [place]. This information is not intended to replace advice given to you by your health care provider. Make sure you discuss any questions you have with your health care provider.

## 2014-09-27 NOTE — ED Notes (Signed)
Gave critical result to MD Nanavati critical I Stat Lactic results.

## 2014-09-27 NOTE — ED Notes (Signed)
Pt reports that her weakness is improved but still present.  No further complaints at this time.  Equal chest rise and fall noted, alert and oriented x 4. No pain.

## 2014-09-27 NOTE — ED Notes (Signed)
Asking for ultrasound IV

## 2014-09-27 NOTE — ED Notes (Signed)
02 96% while ambulating. Patient had minimal difficulty walking. Steady gait.

## 2014-09-27 NOTE — ED Notes (Signed)
One set of blood cultures drawn successfully from peripheral IV. Pt refusing second set.

## 2014-09-27 NOTE — ED Notes (Signed)
While walking patients 02 was 96

## 2014-09-27 NOTE — ED Notes (Signed)
Pt refusing to be stuck for blood cultures.  Will attempt to draw from IV.

## 2014-09-27 NOTE — ED Notes (Signed)
Kelli Reyes tried getting blood cultures and was unable to get to get it

## 2014-09-27 NOTE — ED Provider Notes (Signed)
CSN: [id number]     Arrival date & time 09/27/14  1412 History   First MD Initiated Contact with Patient 09/27/14 1508     Chief Complaint  Patient presents with  . Palpitations  . Anemia     (Consider location/radiation/quality/duration/timing/severity/associated sxs/prior Treatment) HPI Comments: PT comes in with cc of near syncope. Pt has hx of lung cancer last chemo was a month ago, ? COPD hx. She has no cardiac hx. Reports that she was sitting on a chair, went on to get up and started getting lightheaded, having palpitations and shortness of breath. Pt felt like she was going to pass out. She proceeded to sit down, and her palpitations continues, so she called her son to bring her to the ER. She had similar sx about a week ago and had come to the ER. Pt has no hx of PE, DVT - no new leg swelling. She denies exertional dyspnea, chest pains. Pt denies nausea, emesis, fevers, chills, cough, wheezing, hemoptysis, headaches, abdominal pain, uti like symptoms, rash.    ROS 10 Systems reviewed and are negative for acute change except as noted in the HPI.      Patient is a 69 y.o. female presenting with palpitations and anemia. The history is provided by the patient and medical records.  Palpitations Anemia    Past Medical History  Diagnosis Date  . COPD (chronic obstructive pulmonary disease)   . GERD (gastroesophageal reflux disease)   . Hypertension   . Osteoarthritis   . Lung nodules    Past Surgical History  Procedure Laterality Date  . Abdominal hysterectomy    . Video bronchoscopy Bilateral 12/24/2013    Procedure: VIDEO BRONCHOSCOPY WITH FLUORO;  Surgeon: Tanda Rockers, MD;  Location: [facility];  Service: Cardiopulmonary;  Laterality: Bilateral;  . Video bronchoscopy Bilateral 04/15/2014    Procedure: VIDEO BRONCHOSCOPY WITH FLUORO;  Surgeon: Tanda Rockers, MD;  Location: [facility];  Service: Cardiopulmonary;  Laterality: Bilateral;   Family History  Problem  Relation Age of Onset  . Hyperlipidemia Other   . Cancer Neg Hx   . Stroke Neg Hx   . Heart disease Neg Hx    History  Substance Use Topics  . Smoking status: Former Smoker -- 2.00 packs/day for 25 years    Types: Cigarettes    Quit date: 09/03/1996  . Smokeless tobacco: Never Used  . Alcohol Use: No     Comment: beer 2-3 times a week   OB History    No data available     Review of Systems  Cardiovascular: Positive for palpitations.  All other systems reviewed and are negative.     Allergies  Paclitaxel and Ibuprofen  Home Medications   Prior to Admission medications   Medication Sig Start Date End Date Taking? Authorizing Provider  albuterol (PROVENTIL HFA;VENTOLIN HFA) 108 (90 BASE) MCG/ACT inhaler Inhale 2 puffs into the lungs every 6 (six) hours as needed for wheezing or shortness of breath.   Yes Historical Provider, MD  beclomethasone (QVAR) 80 MCG/ACT inhaler Inhale 2 puffs into the lungs 2 (two) times daily.   Yes Historical Provider, MD  dexamethasone (DECADRON) 4 MG tablet Take 8 mg by mouth daily. For 2 days after chemo   Yes Historical Provider, MD  docusate sodium (COLACE) 100 MG capsule Take 100 mg by mouth 2 (two) times daily.   Yes Historical Provider, MD  ipratropium (ATROVENT HFA) 17 MCG/ACT inhaler Inhale 2 puffs into the lungs every 6 (six)  hours as needed for wheezing.   Yes Historical Provider, MD  losartan-hydrochlorothiazide (HYZAAR) 50-12.5 MG per tablet Take 1 tablet by mouth every morning. 08/10/14  Yes Janith Lima, MD  mirtazapine (REMERON) 15 MG tablet Take 15 mg by mouth at bedtime.   Yes Historical Provider, MD  omeprazole (PRILOSEC) 20 MG capsule Take 20 mg by mouth every evening.   Yes Historical Provider, MD  oxyCODONE (OXY IR/ROXICODONE) 5 MG immediate release tablet Take 5 mg by mouth every 4 (four) hours as needed for severe pain.   Yes Historical Provider, MD  predniSONE (DELTASONE) 10 MG tablet Take 10 mg by mouth daily with  breakfast.   Yes Historical Provider, MD  PRESCRIPTION MEDICATION    Yes Historical Provider, MD  prochlorperazine (COMPAZINE) 10 MG tablet Take 10 mg by mouth every 6 (six) hours as needed for nausea or vomiting.   Yes Historical Provider, MD   BP 129/76 mmHg  Pulse 98  Temp(Src) 98.2 F (36.8 C) (Oral)  Resp 18  SpO2 94% Physical Exam  Constitutional: She is oriented to person, place, and time. She appears well-developed and well-nourished.  HENT:  Head: Normocephalic and atraumatic.  Eyes: EOM are normal. Pupils are equal, round, and reactive to light.  Neck: Neck supple. No JVD present.  Cardiovascular: Regular rhythm and normal heart sounds.   Pulmonary/Chest: Effort normal. No respiratory distress. She has no wheezes. She has no rales.  Abdominal: Soft. She exhibits no distension. There is no tenderness. There is no rebound and no guarding.  Musculoskeletal: She exhibits no edema or tenderness.  Neurological: She is alert and oriented to person, place, and time.  Skin: Skin is warm and dry.  Nursing note and vitals reviewed.   ED Course  Procedures (including critical care time) Labs Review Labs Reviewed  CBC - Abnormal; Notable for the following:    WBC 22.1 (*)    RBC 3.25 (*)    Hemoglobin 9.2 (*)    HCT 30.2 (*)    RDW 18.5 (*)    Platelets 603 (*)    All other components within normal limits  BASIC METABOLIC PANEL - Abnormal; Notable for the following:    Potassium 3.1 (*)    Glucose, Bld 216 (*)    Creatinine, Ser 1.09 (*)    GFR calc non Af Amer 51 (*)    GFR calc Af Amer 59 (*)    All other components within normal limits  URINALYSIS, ROUTINE W REFLEX MICROSCOPIC (NOT AT [facility]) - Abnormal; Notable for the following:    APPearance CLOUDY (*)    All other components within normal limits  I-STAT CG4 LACTIC ACID, ED - Abnormal; Notable for the following:    Lactic Acid, Venous 2.39 (*)    All other components within normal limits  CULTURE, BLOOD (ROUTINE X  2)  CULTURE, BLOOD (ROUTINE X 2)  URINE CULTURE  BRAIN NATRIURETIC PEPTIDE  APTT  PROTIME-INR  I-STAT TROPOININ, ED  I-STAT TROPOININ, ED  TYPE AND SCREEN  ABO/RH    Imaging Review Dg Chest 2 View  09/27/2014   CLINICAL DATA:  Tachycardia.  History of left-sided lung tumor.  EXAM: CHEST  2 VIEW  COMPARISON:  07/05/2014 plain film and CT  FINDINGS: Midline trachea. Normal heart size. Atherosclerosis in the transverse aorta. No pleural effusion or pneumothorax. Right upper lobe scarring laterally. Calcified right mid lung granuloma.  Central left upper lobe lung mass measures on the order of 7.3 x 6.0 cm versus  7.7 x 7.0 cm on the prior plain film.  IMPRESSION: Decreased size of a central left upper lobe lung mass since 07/05/2014.  No other acute process or explanation for tachycardia.  Aortic atherosclerosis.   Electronically Signed   By: Abigail Miyamoto M.D.   On: 09/27/2014 15:54   Ct Angio Chest Pe W/cm &/or Wo Cm  09/27/2014   CLINICAL DATA:  Increased heart rate. Dizziness. History lung cancer.  EXAM: CT ANGIOGRAPHY CHEST WITH CONTRAST  TECHNIQUE: Multidetector CT imaging of the chest was performed using the standard protocol during bolus administration of intravenous contrast. Multiplanar CT image reconstructions and MIPs were obtained to evaluate the vascular anatomy.  CONTRAST:  148m OMNIPAQUE IOHEXOL 350 MG/ML SOLN  COMPARISON:  Plain film of earlier today and the CT of 07/05/2014  FINDINGS: Mediastinum/Nodes: The quality of this exam for evaluation of pulmonary embolism is good. No evidence of pulmonary embolism.  No supraclavicular adenopathy. Aortic and branch vessel atherosclerosis. Mild cardiomegaly with no pericardial effusion. Coronary artery atherosclerosis.  Calcified mediastinal and hilar nodes are consistent with old granulomatous disease. No mediastinal or hilar adenopathy.  Lungs/Pleura: No pleural fluid. Lower lobe predominant bronchial wall thickening.  Right upper lobe 5 mm  pulmonary nodule on image 40 of series 7 is similar to the prior, most apparent on sagittal reformatted images of the previous exam.  Calcific foci along the right fissures may relate to old granulomatous disease. Central left upper lobe lung mass measures 5.3 x 4.3 cm today versus 6.4 x 5.7 cm on the prior exam. This is again immediately adjacent to with the transverse aorta.  Upper abdomen: Normal imaged portions of the liver, spleen, stomach.  Musculoskeletal: No acute osseous abnormality.  Review of the MIP images confirms the above findings.  IMPRESSION: 1.  No evidence of pulmonary embolism. 2. Left upper lobe lung mass, decreased in size. 3. No evidence of metastatic disease. A right upper lobe 5 mm pulmonary nodule is unchanged and can be re-evaluated at followup. 4.  Atherosclerosis, including within the coronary arteries.   Electronically Signed   By: KAbigail MiyamotoM.D.   On: 09/27/2014 17:08     EKG Interpretation   Date/Time:  Monday September 27 2014 14:36:18 EDT Ventricular Rate:  132 PR Interval:  115 QRS Duration: 64 QT Interval:  299 QTC Calculation: 443 R Axis:   54 Text Interpretation:  Sinus tachycardia Probable anteroseptal infarct, old  No significant change since last tracing Confirmed by Kerry-Anne Mezo, MD, AThelma Comp [phone] on 09/27/2014 3:23:16 PM      7:00 pm Pt ambulated in the ER w/o any problems. Cardiac monitoring normal. Pt is asymptomatic. Workup is normal. Pt and family ok with d/c with return precautions and pcp f/u in few days.  MDM   Final diagnoses:  Tachycardia  Near syncope  Palpitations  Primary lung cancer, unspecified laterality    Pt comes in with near syncope associated with some dyspnea, palpitations.  DDx includes: Orthostatic hypotension Vertebral artery dissection/stenosis Dysrhythmia PE Vasovagal/neurocardiogenic syncope Aortic stenosis Anemia  EKG shows no right sided strain, hx and exam not convincing for any infectious etiology, no signs  of DVT on exam. Pt has shock index > 1. Lactate ordered, and is higher than 2. Will get blood cultures. She is immunosupressed due to decadron on board. Last chemo was a month ago. Spoke with the hospitalist team, they agree that ED CT PE better than VQ - so we shall get there here. IV fluids ordered, Cr  is slightly elevated.       Varney Biles, MD 09/27/14 1910

## 2014-09-27 NOTE — ED Notes (Signed)
Patient in with complaints of "heart flutters", states that it started an hour ago. Reports nausea, dizziness, and sweating during fluttering episodes. States that she is scheduled to receive a blood transfusion on Thursday at [facility].

## 2014-09-27 NOTE — ED Notes (Signed)
Unable to start PIV and draw blood after 1 attempt. Pt sts she is really hard stick and always requires ultrasound IV insertion. Charge RN Delsa Sale to attempt US guided IV.

## 2014-09-29 LAB — URINE CULTURE

## 2014-10-02 LAB — CULTURE, BLOOD (ROUTINE X 2): Culture: NO GROWTH

## 2014-10-04 ENCOUNTER — Ambulatory Visit (INDEPENDENT_AMBULATORY_CARE_PROVIDER_SITE_OTHER): Payer: PPO | Admitting: Internal Medicine

## 2014-10-04 ENCOUNTER — Encounter: Payer: Self-pay | Admitting: Internal Medicine

## 2014-10-04 ENCOUNTER — Other Ambulatory Visit (INDEPENDENT_AMBULATORY_CARE_PROVIDER_SITE_OTHER): Payer: PPO

## 2014-10-04 VITALS — BP 118/72 | HR 120 | Temp 99.5°F | Resp 20 | Ht 62.0 in | Wt 142.0 lb

## 2014-10-04 DIAGNOSIS — I10 Essential (primary) hypertension: Secondary | ICD-10-CM

## 2014-10-04 DIAGNOSIS — D510 Vitamin B12 deficiency anemia due to intrinsic factor deficiency: Secondary | ICD-10-CM | POA: Diagnosis not present

## 2014-10-04 DIAGNOSIS — R739 Hyperglycemia, unspecified: Secondary | ICD-10-CM | POA: Diagnosis not present

## 2014-10-04 DIAGNOSIS — D509 Iron deficiency anemia, unspecified: Secondary | ICD-10-CM

## 2014-10-04 DIAGNOSIS — I499 Cardiac arrhythmia, unspecified: Secondary | ICD-10-CM

## 2014-10-04 LAB — CBC WITH DIFFERENTIAL/PLATELET
Basophils Absolute: 0.2 10*3/uL — ABNORMAL HIGH (ref 0.0–0.1)
Basophils Relative: 0.6 % (ref 0.0–3.0)
EOS ABS: 0.1 10*3/uL (ref 0.0–0.7)
Eosinophils Relative: 0.3 % (ref 0.0–5.0)
HCT: 38.5 % (ref 36.0–46.0)
Hemoglobin: 12.6 g/dL (ref 12.0–15.0)
Lymphs Abs: 0.7 10*3/uL (ref 0.7–4.0)
MCHC: 32.7 g/dL (ref 30.0–36.0)
MCV: 88.8 fl (ref 78.0–100.0)
Monocytes Absolute: 0.7 10*3/uL (ref 0.1–1.0)
Monocytes Relative: 2.8 % — ABNORMAL LOW (ref 3.0–12.0)
Neutro Abs: 23.1 10*3/uL — ABNORMAL HIGH (ref 1.4–7.7)
Platelets: 466 10*3/uL — ABNORMAL HIGH (ref 150.0–400.0)
RBC: 4.34 Mil/uL (ref 3.87–5.11)
RDW: 17.2 % — ABNORMAL HIGH (ref 11.5–15.5)

## 2014-10-04 LAB — BASIC METABOLIC PANEL
BUN: 15 mg/dL (ref 6–23)
CHLORIDE: 97 meq/L (ref 96–112)
CO2: 24 meq/L (ref 19–32)
CREATININE: 1.05 mg/dL (ref 0.40–1.20)
Calcium: 10.6 mg/dL — ABNORMAL HIGH (ref 8.4–10.5)
GFR: 67.06 mL/min (ref >60.00)
Glucose, Bld: 219 mg/dL — ABNORMAL HIGH (ref 70–99)
POTASSIUM: 3.6 meq/L (ref 3.5–5.1)
SODIUM: 133 meq/L — AB (ref 135–145)

## 2014-10-04 LAB — RETICULOCYTES
ABS Retic: 61.2 10*3/uL (ref 19.0–186.0)
RBC.: 4.37 MIL/uL (ref 3.87–5.11)
RETIC CT PCT: 1.4 % (ref 0.4–2.3)

## 2014-10-04 LAB — IBC PANEL
Iron: 17 ug/dL — ABNORMAL LOW (ref 42–145)
Saturation Ratios: 7.2 % — ABNORMAL LOW (ref 20.0–50.0)
TRANSFERRIN: 169 mg/dL — AB (ref 212.0–360.0)

## 2014-10-04 LAB — FERRITIN

## 2014-10-04 LAB — TSH: TSH: 1.17 u[IU]/mL (ref 0.35–4.50)

## 2014-10-04 LAB — HEMOGLOBIN A1C: Hgb A1c MFr Bld: 6.7 % — ABNORMAL HIGH (ref 4.6–6.5)

## 2014-10-04 LAB — FOLATE: Folate: >24.8 ng/mL (ref >5.9)

## 2014-10-04 LAB — VITAMIN B12: Vitamin B-12: 596 pg/mL (ref 211–911)

## 2014-10-04 NOTE — Progress Notes (Signed)
Pre visit review using our clinic review tool, if applicable. No additional management support is needed unless otherwise documented below in the visit note. 

## 2014-10-04 NOTE — Patient Instructions (Signed)
Anemia, Nonspecific Anemia is a condition in which the concentration of red blood cells or hemoglobin in the blood is below normal. Hemoglobin is a substance in red blood cells that carries oxygen to the tissues of the body. Anemia results in not enough oxygen reaching these tissues.  CAUSES  Common causes of anemia include:   Excessive bleeding. Bleeding may be internal or external. This includes excessive bleeding from periods (in women) or from the intestine.   Poor nutrition.   Chronic kidney, thyroid, and liver disease.  Bone marrow disorders that decrease red blood cell production.  Cancer and treatments for cancer.  HIV, AIDS, and their treatments.  Spleen problems that increase red blood cell destruction.  Blood disorders.  Excess destruction of red blood cells due to infection, medicines, and autoimmune disorders. SIGNS AND SYMPTOMS   Minor weakness.   Dizziness.   Headache.  Palpitations.   Shortness of breath, especially with exercise.   Paleness.  Cold sensitivity.  Indigestion.  Nausea.  Difficulty sleeping.  Difficulty concentrating. Symptoms may occur suddenly or they may develop slowly.  DIAGNOSIS  Additional blood tests are often needed. These help your health care provider determine the best treatment. Your health care provider will check your stool for blood and look for other causes of blood loss.  TREATMENT  Treatment varies depending on the cause of the anemia. Treatment can include:   Supplements of iron, vitamin B12, or folic acid.   Hormone medicines.   A blood transfusion. This may be needed if blood loss is severe.   Hospitalization. This may be needed if there is significant continual blood loss.   Dietary changes.  Spleen removal. HOME CARE INSTRUCTIONS Keep all follow-up appointments. It often takes many weeks to correct anemia, and having your health care provider check on your condition and your response to  treatment is very important. SEEK IMMEDIATE MEDICAL CARE IF:   You develop extreme weakness, shortness of breath, or chest pain.   You become dizzy or have trouble concentrating.  You develop heavy vaginal bleeding.   You develop a rash.   You have bloody or black, tarry stools.   You faint.   You vomit up blood.   You vomit repeatedly.   You have abdominal pain.  You have a fever or persistent symptoms for more than 2-3 days.   You have a fever and your symptoms suddenly get worse.   You are dehydrated.  MAKE SURE YOU:  Understand these instructions.  Will watch your condition.  Will get help right away if you are not doing well or get worse. Document Released: 04/19/2004 Document Revised: 11/12/2012 Document Reviewed: 09/05/2012 Exit[employer] Patient Information 2015 [employer]. This information is not intended to replace advice given to you by your health care provider. Make sure you discuss any questions you have with your health care provider.  

## 2014-10-04 NOTE — Progress Notes (Signed)
Subjective:  Patient ID: Kelli Reyes, female    DOB: April 05, 1945  Age: 69 y.o. MRN: [id number]  CC: Anemia   HPI Kelli Reyes presents for the complaint of shortness of breath and fatigue. She was seen at [facility] about 5 days ago and got a transfusion of packed red blood cells. She was recently seen in emergency room for palpitations and was found to have sinus tachycardia with some occasional ventricular beats. She does not complain of palpitations today. She has chronic dull aching pain on the left side of her chest which is consistent with her history of lung cancer.  Outpatient Prescriptions Prior to Visit  Medication Sig Dispense Refill  . albuterol (PROVENTIL HFA;VENTOLIN HFA) 108 (90 BASE) MCG/ACT inhaler Inhale 2 puffs into the lungs every 6 (six) hours as needed for wheezing or shortness of breath.    . beclomethasone (QVAR) 80 MCG/ACT inhaler Inhale 2 puffs into the lungs 2 (two) times daily.    Marland Kitchen docusate sodium (COLACE) 100 MG capsule Take 100 mg by mouth 2 (two) times daily.    Marland Kitchen ipratropium (ATROVENT HFA) 17 MCG/ACT inhaler Inhale 2 puffs into the lungs every 6 (six) hours as needed for wheezing.    Marland Kitchen losartan-hydrochlorothiazide (HYZAAR) 50-12.5 MG per tablet Take 1 tablet by mouth every morning. 90 tablet 1  . mirtazapine (REMERON) 15 MG tablet Take 15 mg by mouth at bedtime.    Marland Kitchen omeprazole (PRILOSEC) 20 MG capsule Take 20 mg by mouth every evening.    Marland Kitchen oxyCODONE (OXY IR/ROXICODONE) 5 MG immediate release tablet Take 5 mg by mouth every 4 (four) hours as needed for severe pain.    . predniSONE (DELTASONE) 10 MG tablet Take 10 mg by mouth daily with breakfast.    . PRESCRIPTION MEDICATION     . prochlorperazine (COMPAZINE) 10 MG tablet Take 10 mg by mouth every 6 (six) hours as needed for nausea or vomiting.    Marland Kitchen dexamethasone (DECADRON) 4 MG tablet Take 8 mg by mouth daily. For 2 days after chemo     No facility-administered medications prior to visit.    ROS Review  of Systems  Constitutional: Positive for appetite change and fatigue. Negative for fever, chills, diaphoresis, activity change and unexpected weight change.  HENT: Negative.  Negative for nosebleeds, sore throat and trouble swallowing.   Eyes: Negative.  Negative for visual disturbance.  Respiratory: Positive for shortness of breath. Negative for apnea, cough, choking, chest tightness, wheezing and stridor.   Cardiovascular: Positive for chest pain. Negative for palpitations and leg swelling.  Gastrointestinal: Negative.  Negative for nausea, vomiting, abdominal pain, diarrhea, constipation and blood in stool.  Endocrine: Negative.   Genitourinary: Negative.  Negative for frequency, hematuria, vaginal bleeding and difficulty urinating.  Musculoskeletal: Positive for back pain. Negative for myalgias, joint swelling and arthralgias.  Skin: Negative.  Negative for color change, pallor and rash.  Allergic/Immunologic: Negative.   Neurological: Negative.  Negative for dizziness, tremors, syncope, weakness, light-headedness, numbness and headaches.  Hematological: Negative.  Negative for adenopathy. Does not bruise/bleed easily.  Psychiatric/Behavioral: Negative.     Objective:  BP 118/72 mmHg  Pulse 120  Temp(Src) 99.5 F (37.5 C) (Oral)  Resp 20  Ht '5\' 2"'$  (1.575 m)  Wt 142 lb (64.411 kg)  BMI 25.97 kg/m2  SpO2 96%  BP Readings from Last 3 Encounters:  10/04/14 118/72  09/27/14 129/76  09/21/14 114/79    Wt Readings from Last 3 Encounters:  10/04/14 142 lb (  64.411 kg)  04/29/14 144 lb (65.318 kg)  04/22/14 142 lb (64.411 kg)    Physical Exam  Constitutional: She is oriented to person, place, and time.  Non-toxic appearance. She does not have a sickly appearance. She does not appear ill. No distress.  HENT:  Mouth/Throat: Oropharynx is clear and moist. No oropharyngeal exudate.  Eyes: Conjunctivae are normal. Right eye exhibits no discharge. Left eye exhibits no discharge. No  scleral icterus.  Neck: Normal range of motion. Neck supple. No JVD present. No tracheal deviation present. No thyromegaly present.  Cardiovascular: Regular rhythm, normal heart sounds and intact distal pulses.   Occasional extrasystoles are present. Tachycardia present.  Exam reveals no gallop and no friction rub.   No murmur heard. EKG today  Sinus  Tachycardia  -with Pause  - occasional ectopic ventricular beat    -Left atrial enlargement.   -  Nonspecific T-abnormality.   ABNORMAL   Pulmonary/Chest: Effort normal. No accessory muscle usage or stridor. No tachypnea. No respiratory distress. She has decreased breath sounds in the left lower field. She has no wheezes. She has no rhonchi. She has no rales.  Abdominal: Soft. Bowel sounds are normal. She exhibits no distension and no mass. There is no tenderness. There is no rebound and no guarding.  Musculoskeletal: Normal range of motion. She exhibits no edema or tenderness.  Lymphadenopathy:    She has no cervical adenopathy.  Neurological: She is oriented to person, place, and time.  Skin: Skin is warm and dry. No rash noted. She is not diaphoretic. No erythema. No pallor.  Psychiatric: Her speech is normal and behavior is normal. Judgment and thought content normal. Her mood appears anxious. Her affect is not angry, not blunt, not labile and not inappropriate. Cognition and memory are normal. She does not exhibit a depressed mood.    Lab Results  Component Value Date   WBC 24.5 cH* 10/04/2014   HGB 12.6 10/04/2014   HCT 38.5 10/04/2014   PLT 466.0* 10/04/2014   GLUCOSE 219* 10/04/2014   CHOL 234* 06/29/2013   TRIG 139.0 06/29/2013   HDL 42.90 06/29/2013   LDLDIRECT 65.3 07/15/2012   LDLCALC 163* 06/29/2013   ALT 22 07/05/2014   AST 16 07/05/2014   NA 133* 10/04/2014   K 3.6 10/04/2014   CL 97 10/04/2014   CREATININE 1.05 10/04/2014   BUN 15 10/04/2014   CO2 24 10/04/2014   TSH 1.17 10/04/2014   INR 1.1* 08/04/2012    HGBA1C 6.7* 10/04/2014    Dg Chest 2 View  09/27/2014   CLINICAL DATA:  Tachycardia.  History of left-sided lung tumor.  EXAM: CHEST  2 VIEW  COMPARISON:  07/05/2014 plain film and CT  FINDINGS: Midline trachea. Normal heart size. Atherosclerosis in the transverse aorta. No pleural effusion or pneumothorax. Right upper lobe scarring laterally. Calcified right mid lung granuloma.  Central left upper lobe lung mass measures on the order of 7.3 x 6.0 cm versus 7.7 x 7.0 cm on the prior plain film.  IMPRESSION: Decreased size of a central left upper lobe lung mass since 07/05/2014.  No other acute process or explanation for tachycardia.  Aortic atherosclerosis.   Electronically Signed   By: Abigail Miyamoto M.D.   On: 09/27/2014 15:54   Ct Angio Chest Pe W/cm &/or Wo Cm  09/27/2014   CLINICAL DATA:  Increased heart rate. Dizziness. History lung cancer.  EXAM: CT ANGIOGRAPHY CHEST WITH CONTRAST  TECHNIQUE: Multidetector CT imaging of the chest was  performed using the standard protocol during bolus administration of intravenous contrast. Multiplanar CT image reconstructions and MIPs were obtained to evaluate the vascular anatomy.  CONTRAST:  148m OMNIPAQUE IOHEXOL 350 MG/ML SOLN  COMPARISON:  Plain film of earlier today and the CT of 07/05/2014  FINDINGS: Mediastinum/Nodes: The quality of this exam for evaluation of pulmonary embolism is good. No evidence of pulmonary embolism.  No supraclavicular adenopathy. Aortic and branch vessel atherosclerosis. Mild cardiomegaly with no pericardial effusion. Coronary artery atherosclerosis.  Calcified mediastinal and hilar nodes are consistent with old granulomatous disease. No mediastinal or hilar adenopathy.  Lungs/Pleura: No pleural fluid. Lower lobe predominant bronchial wall thickening.  Right upper lobe 5 mm pulmonary nodule on image 40 of series 7 is similar to the prior, most apparent on sagittal reformatted images of the previous exam.  Calcific foci along the right  fissures may relate to old granulomatous disease. Central left upper lobe lung mass measures 5.3 x 4.3 cm today versus 6.4 x 5.7 cm on the prior exam. This is again immediately adjacent to with the transverse aorta.  Upper abdomen: Normal imaged portions of the liver, spleen, stomach.  Musculoskeletal: No acute osseous abnormality.  Review of the MIP images confirms the above findings.  IMPRESSION: 1.  No evidence of pulmonary embolism. 2. Left upper lobe lung mass, decreased in size. 3. No evidence of metastatic disease. A right upper lobe 5 mm pulmonary nodule is unchanged and can be re-evaluated at followup. 4.  Atherosclerosis, including within the coronary arteries.   Electronically Signed   By: KAbigail MiyamotoM.D.   On: 09/27/2014 17:08    Assessment & Plan:   BMandiewas seen today for anemia.  Diagnoses and all orders for this visit:  Hyperglycemia - she has new onset type 2 diabetes mellitus. Her A1c is only 6.7% so she does not need medical therapy at this time. Orders: -     Hemoglobin A1c; Future -     Basic metabolic panel; Future  Pernicious anemia - her ferritin level is elevated consistent with inflammation of primary bronchogenic carcinoma. Her B12 and folate levels are normal. Her iron level is low. Her hemoglobin and hematocrit today are normal. Will start iron replacement therapy. Orders: -     Reticulocytes; Future -     CBC with Differential/Platelet; Future -     Vitamin B12; Future -     Folate; Future -     Ferritin; Future -     IBC panel; Future  Irregular heart rate - her EKG today shows an occasional ectopic ventricular beat. No treatment is indicated . Orders: -     EKG 12-Lead -     TSH; Future  Essential hypertension, benign- her blood pressure is well controlled. Orders: -     TSH; Future  Iron deficiency anemia Orders: -     FeAsp-B12-FA-C-DSS-SuccAc-Zn (FERIVA 21/7) 75-1 MG TABS; Take 1 tablet by mouth daily.  I have discontinued Ms. Jowett's  dexamethasone. I am also having her start on [place]7. Additionally, I am having her maintain her prochlorperazine, beclomethasone, ipratropium, docusate sodium, PRESCRIPTION MEDICATION, losartan-hydrochlorothiazide, mirtazapine, predniSONE, oxyCODONE, albuterol, omeprazole, and MELATONIN PO.  Meds ordered this encounter  Medications  . MELATONIN PO    Sig: Take by mouth.  . FeAsp-B12-FA-C-DSS-SuccAc-Zn (FERIVA 21/7) 75-1 MG TABS    Sig: Take 1 tablet by mouth daily.    Dispense:  28 tablet    Refill:  11     Follow-up: Return in  about 2 weeks (around 10/18/2014).  Scarlette Calico, MD

## 2014-10-05 ENCOUNTER — Telehealth: Payer: Self-pay

## 2014-10-05 MED ORDER — FERIVA 21/7 75-1 MG PO TABS: 1.0000 | ORAL_TABLET | Freq: Every day | ORAL | Status: AC

## 2014-10-05 NOTE — Telephone Encounter (Signed)
LMOVM for results per Dr. Ronnald Ramp note

## 2014-10-05 NOTE — Telephone Encounter (Signed)
-----   Message from Janith Lima, MD sent at 10/05/2014  7:24 AM EDT ----- LA  pls tell her that her blood counts are better but her iron level is low, I will send an iron supplement to her pharmacy

## 2014-10-21 ENCOUNTER — Encounter (HOSPITAL_COMMUNITY): Payer: Self-pay | Admitting: *Deleted

## 2014-10-21 ENCOUNTER — Emergency Department (HOSPITAL_COMMUNITY): Payer: PPO

## 2014-10-21 ENCOUNTER — Ambulatory Visit (INDEPENDENT_AMBULATORY_CARE_PROVIDER_SITE_OTHER): Payer: PPO | Admitting: Internal Medicine

## 2014-10-21 ENCOUNTER — Encounter: Payer: Self-pay | Admitting: Internal Medicine

## 2014-10-21 ENCOUNTER — Inpatient Hospital Stay (HOSPITAL_COMMUNITY)
Admission: EM | Admit: 2014-10-21 | Discharge: 2014-10-28 | DRG: 871 | Disposition: A | Discharge status: Home Health | Payer: PPO | Attending: Internal Medicine | Admitting: Internal Medicine

## 2014-10-21 VITALS — BP 80/50 | HR 131 | Temp 99.0°F | Resp 20 | Ht 62.0 in | Wt 136.0 lb

## 2014-10-21 DIAGNOSIS — R519 Headache, unspecified: Secondary | ICD-10-CM

## 2014-10-21 DIAGNOSIS — Z79899 Other long term (current) drug therapy: Secondary | ICD-10-CM

## 2014-10-21 DIAGNOSIS — A419 Sepsis, unspecified organism: Secondary | ICD-10-CM | POA: Diagnosis present

## 2014-10-21 DIAGNOSIS — E876 Hypokalemia: Secondary | ICD-10-CM | POA: Diagnosis present

## 2014-10-21 DIAGNOSIS — Z7952 Long term (current) use of systemic steroids: Secondary | ICD-10-CM

## 2014-10-21 DIAGNOSIS — D630 Anemia in neoplastic disease: Secondary | ICD-10-CM | POA: Diagnosis present

## 2014-10-21 DIAGNOSIS — E860 Dehydration: Secondary | ICD-10-CM | POA: Diagnosis present

## 2014-10-21 DIAGNOSIS — R531 Weakness: Secondary | ICD-10-CM | POA: Diagnosis present

## 2014-10-21 DIAGNOSIS — Z886 Allergy status to analgesic agent status: Secondary | ICD-10-CM

## 2014-10-21 DIAGNOSIS — C3410 Malignant neoplasm of upper lobe, unspecified bronchus or lung: Secondary | ICD-10-CM | POA: Diagnosis present

## 2014-10-21 DIAGNOSIS — R0789 Other chest pain: Secondary | ICD-10-CM | POA: Diagnosis not present

## 2014-10-21 DIAGNOSIS — M1990 Unspecified osteoarthritis, unspecified site: Secondary | ICD-10-CM | POA: Diagnosis present

## 2014-10-21 DIAGNOSIS — R739 Hyperglycemia, unspecified: Secondary | ICD-10-CM | POA: Diagnosis present

## 2014-10-21 DIAGNOSIS — E872 Acidosis: Secondary | ICD-10-CM | POA: Diagnosis present

## 2014-10-21 DIAGNOSIS — E871 Hypo-osmolality and hyponatremia: Secondary | ICD-10-CM | POA: Diagnosis present

## 2014-10-21 DIAGNOSIS — Z85118 Personal history of other malignant neoplasm of bronchus and lung: Secondary | ICD-10-CM | POA: Diagnosis not present

## 2014-10-21 DIAGNOSIS — E861 Hypovolemia: Secondary | ICD-10-CM | POA: Diagnosis present

## 2014-10-21 DIAGNOSIS — I959 Hypotension, unspecified: Secondary | ICD-10-CM

## 2014-10-21 DIAGNOSIS — I10 Essential (primary) hypertension: Secondary | ICD-10-CM | POA: Diagnosis present

## 2014-10-21 DIAGNOSIS — J189 Pneumonia, unspecified organism: Secondary | ICD-10-CM | POA: Diagnosis present

## 2014-10-21 DIAGNOSIS — N390 Urinary tract infection, site not specified: Secondary | ICD-10-CM | POA: Diagnosis present

## 2014-10-21 DIAGNOSIS — Y95 Nosocomial condition: Secondary | ICD-10-CM | POA: Diagnosis present

## 2014-10-21 DIAGNOSIS — C3412 Malignant neoplasm of upper lobe, left bronchus or lung: Secondary | ICD-10-CM | POA: Diagnosis not present

## 2014-10-21 DIAGNOSIS — R51 Headache: Secondary | ICD-10-CM

## 2014-10-21 DIAGNOSIS — Z79891 Long term (current) use of opiate analgesic: Secondary | ICD-10-CM

## 2014-10-21 DIAGNOSIS — K219 Gastro-esophageal reflux disease without esophagitis: Secondary | ICD-10-CM | POA: Diagnosis present

## 2014-10-21 DIAGNOSIS — I493 Ventricular premature depolarization: Secondary | ICD-10-CM | POA: Diagnosis not present

## 2014-10-21 DIAGNOSIS — G9340 Encephalopathy, unspecified: Secondary | ICD-10-CM | POA: Diagnosis present

## 2014-10-21 DIAGNOSIS — R627 Adult failure to thrive: Secondary | ICD-10-CM | POA: Diagnosis not present

## 2014-10-21 DIAGNOSIS — R502 Drug induced fever: Secondary | ICD-10-CM | POA: Diagnosis present

## 2014-10-21 DIAGNOSIS — T380X5A Adverse effect of glucocorticoids and synthetic analogues, initial encounter: Secondary | ICD-10-CM | POA: Diagnosis present

## 2014-10-21 DIAGNOSIS — Z9221 Personal history of antineoplastic chemotherapy: Secondary | ICD-10-CM

## 2014-10-21 DIAGNOSIS — J449 Chronic obstructive pulmonary disease, unspecified: Secondary | ICD-10-CM | POA: Diagnosis present

## 2014-10-21 DIAGNOSIS — Z87891 Personal history of nicotine dependence: Secondary | ICD-10-CM

## 2014-10-21 DIAGNOSIS — R509 Fever, unspecified: Secondary | ICD-10-CM

## 2014-10-21 DIAGNOSIS — R05 Cough: Secondary | ICD-10-CM

## 2014-10-21 DIAGNOSIS — R059 Cough, unspecified: Secondary | ICD-10-CM

## 2014-10-21 DIAGNOSIS — Z90710 Acquired absence of both cervix and uterus: Secondary | ICD-10-CM | POA: Diagnosis not present

## 2014-10-21 DIAGNOSIS — Z888 Allergy status to other drugs, medicaments and biological substances status: Secondary | ICD-10-CM

## 2014-10-21 LAB — CBC WITH DIFFERENTIAL/PLATELET
BASOS PCT: 0 % (ref 0–1)
Basophils Absolute: 0 10*3/uL (ref 0.0–0.1)
Eosinophils Absolute: 0 10*3/uL (ref 0.0–0.7)
Eosinophils Relative: 0 % (ref 0–5)
HCT: 37.6 % (ref 36.0–46.0)
Hemoglobin: 11.9 g/dL — ABNORMAL LOW (ref 12.0–15.0)
LYMPHS ABS: 0.8 10*3/uL (ref 0.7–4.0)
Lymphocytes Relative: 3 % — ABNORMAL LOW (ref 12–46)
MCH: 29.5 pg (ref 26.0–34.0)
MCHC: 31.6 g/dL (ref 30.0–36.0)
MCV: 93.3 fL (ref 78.0–100.0)
MONOS PCT: 5 % (ref 3–12)
Monocytes Absolute: 1.3 10*3/uL — ABNORMAL HIGH (ref 0.1–1.0)
Neutro Abs: 23 10*3/uL — ABNORMAL HIGH (ref 1.7–7.7)
Neutrophils Relative %: 92 % — ABNORMAL HIGH (ref 43–77)
PLATELETS: 320 10*3/uL (ref 150–400)
RBC: 4.03 MIL/uL (ref 3.87–5.11)
RDW: 15.4 % (ref 11.5–15.5)
WBC: 25.1 10*3/uL — AB (ref 4.0–10.5)

## 2014-10-21 LAB — I-STAT CHEM 8, ED
BUN: 24 mg/dL — ABNORMAL HIGH (ref 6–20)
CREATININE: 1 mg/dL (ref 0.44–1.00)
Calcium, Ion: 1.39 mmol/L — ABNORMAL HIGH (ref 1.13–1.30)
Chloride: 97 mmol/L — ABNORMAL LOW (ref 101–111)
Glucose, Bld: 361 mg/dL — ABNORMAL HIGH (ref 65–99)
HEMATOCRIT: 36 % (ref 36.0–46.0)
HEMOGLOBIN: 12.2 g/dL (ref 12.0–15.0)
Potassium: 3.9 mmol/L (ref 3.5–5.1)
SODIUM: 134 mmol/L — AB (ref 135–145)
TCO2: 26 mmol/L (ref 0–100)

## 2014-10-21 LAB — I-STAT TROPONIN, ED: Troponin i, poc: 0 ng/mL (ref 0.00–0.08)

## 2014-10-21 LAB — URINALYSIS, ROUTINE W REFLEX MICROSCOPIC
Glucose, UA: NEGATIVE mg/dL
Hgb urine dipstick: NEGATIVE
Ketones, ur: 15 mg/dL — AB
NITRITE: POSITIVE — AB
PROTEIN: 100 mg/dL — AB
Specific Gravity, Urine: 1.029 (ref 1.005–1.030)
UROBILINOGEN UA: 4 mg/dL — AB (ref 0.0–1.0)
pH: 5 (ref 5.0–8.0)

## 2014-10-21 LAB — CBG MONITORING, ED: GLUCOSE-CAPILLARY: 262 mg/dL — AB (ref 65–99)

## 2014-10-21 LAB — URINE MICROSCOPIC-ADD ON

## 2014-10-21 LAB — I-STAT CG4 LACTIC ACID, ED: Lactic Acid, Venous: 2.43 mmol/L (ref 0.5–2.0)

## 2014-10-21 LAB — GLUCOSE, CAPILLARY: Glucose-Capillary: 187 mg/dL — ABNORMAL HIGH (ref 65–99)

## 2014-10-21 MED ORDER — BUDESONIDE 0.5 MG/2ML IN SUSP
0.5000 mg | Freq: Two times a day (BID) | RESPIRATORY_TRACT | Status: DC
  Administered 2014-10-21 – 2014-10-28 (×14): 0.5 mg via RESPIRATORY_TRACT
  Filled 2014-10-21 (×14): qty 2

## 2014-10-21 MED ORDER — PROCHLORPERAZINE MALEATE 10 MG PO TABS: 10.0000 mg | ORAL_TABLET | Freq: Four times a day (QID) | ORAL | Status: DC | PRN

## 2014-10-21 MED ORDER — PIPERACILLIN-TAZOBACTAM 3.375 G IVPB 30 MIN
3.3750 g | INTRAVENOUS | Status: AC
  Administered 2014-10-21: 3.375 g via INTRAVENOUS
  Filled 2014-10-21: qty 50

## 2014-10-21 MED ORDER — BECLOMETHASONE DIPROPIONATE 80 MCG/ACT IN AERS
2.0000 | INHALATION_SPRAY | Freq: Two times a day (BID) | RESPIRATORY_TRACT | Status: DC
Start: 2014-10-21 — End: 2014-10-21

## 2014-10-21 MED ORDER — SODIUM CHLORIDE 0.9 % IV SOLN
1000.0000 mL | Freq: Once | INTRAVENOUS | Status: AC
  Administered 2014-10-21: 1000 mL via INTRAVENOUS

## 2014-10-21 MED ORDER — HYDROCORTISONE NA SUCCINATE PF 100 MG IJ SOLR
100.0000 mg | Freq: Four times a day (QID) | INTRAMUSCULAR | Status: DC
  Administered 2014-10-21 – 2014-10-22 (×3): 100 mg via INTRAVENOUS
  Filled 2014-10-21 (×4): qty 2

## 2014-10-21 MED ORDER — MORPHINE SULFATE 2 MG/ML IJ SOLN: 1.0000 mg | INTRAMUSCULAR | Status: DC | PRN

## 2014-10-21 MED ORDER — ENOXAPARIN SODIUM 40 MG/0.4ML ~~LOC~~ SOLN
40.0000 mg | Freq: Every day | SUBCUTANEOUS | Status: DC
  Administered 2014-10-21 – 2014-10-27 (×7): 40 mg via SUBCUTANEOUS
  Filled 2014-10-21 (×7): qty 0.4

## 2014-10-21 MED ORDER — HYDROCODONE-ACETAMINOPHEN 5-325 MG PO TABS: 1.0000 | ORAL_TABLET | ORAL | Status: DC | PRN

## 2014-10-21 MED ORDER — CETYLPYRIDINIUM CHLORIDE 0.05 % MT LIQD
7.0000 mL | Freq: Two times a day (BID) | OROMUCOSAL | Status: DC
  Administered 2014-10-22 – 2014-10-28 (×13): 7 mL via OROMUCOSAL

## 2014-10-21 MED ORDER — HYDROCORTISONE SOD SUCCINATE 100 MG PF FOR IT USE: 100.0000 mg | Freq: Four times a day (QID) | INTRAMUSCULAR | Status: DC

## 2014-10-21 MED ORDER — ALBUTEROL SULFATE (2.5 MG/3ML) 0.083% IN NEBU: 3.0000 mL | INHALATION_SOLUTION | Freq: Four times a day (QID) | RESPIRATORY_TRACT | Status: DC | PRN

## 2014-10-21 MED ORDER — INSULIN ASPART 100 UNIT/ML ~~LOC~~ SOLN
0.0000 [IU] | Freq: Three times a day (TID) | SUBCUTANEOUS | Status: DC
Start: 2014-10-22 — End: 2014-10-22

## 2014-10-21 MED ORDER — IPRATROPIUM BROMIDE 0.02 % IN SOLN
2.5000 mL | Freq: Four times a day (QID) | RESPIRATORY_TRACT | Status: DC
  Administered 2014-10-21: 0.5 mg via RESPIRATORY_TRACT
  Filled 2014-10-21: qty 2.5

## 2014-10-21 MED ORDER — SODIUM CHLORIDE 0.9 % IV SOLN
1000.0000 mL | INTRAVENOUS | Status: DC
  Administered 2014-10-21 – 2014-10-22 (×4): 1000 mL via INTRAVENOUS

## 2014-10-21 MED ORDER — PANTOPRAZOLE SODIUM 40 MG PO TBEC
40.0000 mg | DELAYED_RELEASE_TABLET | Freq: Every day | ORAL | Status: DC
  Administered 2014-10-22 – 2014-10-28 (×7): 40 mg via ORAL
  Filled 2014-10-21 (×9): qty 1

## 2014-10-21 MED ORDER — ONDANSETRON HCL 4 MG PO TABS: 4.0000 mg | ORAL_TABLET | Freq: Four times a day (QID) | ORAL | Status: DC | PRN

## 2014-10-21 MED ORDER — IPRATROPIUM-ALBUTEROL 0.5-2.5 (3) MG/3ML IN SOLN
3.0000 mL | Freq: Three times a day (TID) | RESPIRATORY_TRACT | Status: DC
  Administered 2014-10-22 – 2014-10-23 (×5): 3 mL via RESPIRATORY_TRACT
  Filled 2014-10-21 (×5): qty 3

## 2014-10-21 MED ORDER — ACETAMINOPHEN 325 MG PO TABS
650.0000 mg | ORAL_TABLET | Freq: Four times a day (QID) | ORAL | Status: DC | PRN
  Administered 2014-10-25 – 2014-10-28 (×3): 650 mg via ORAL
  Filled 2014-10-21 (×3): qty 2

## 2014-10-21 MED ORDER — DOCUSATE SODIUM 100 MG PO CAPS
100.0000 mg | ORAL_CAPSULE | Freq: Two times a day (BID) | ORAL | Status: DC
  Administered 2014-10-21 – 2014-10-25 (×8): 100 mg via ORAL
  Filled 2014-10-21 (×16): qty 1

## 2014-10-21 MED ORDER — ACETAMINOPHEN 650 MG RE SUPP: 650.0000 mg | Freq: Four times a day (QID) | RECTAL | Status: DC | PRN

## 2014-10-21 MED ORDER — VANCOMYCIN HCL 500 MG IV SOLR
500.0000 mg | Freq: Two times a day (BID) | INTRAVENOUS | Status: DC
  Administered 2014-10-22 – 2014-10-24 (×6): 500 mg via INTRAVENOUS
  Filled 2014-10-21 (×8): qty 500

## 2014-10-21 MED ORDER — OXYCODONE HCL 5 MG PO TABS
5.0000 mg | ORAL_TABLET | ORAL | Status: DC | PRN
  Administered 2014-10-23 – 2014-10-26 (×4): 5 mg via ORAL
  Filled 2014-10-21 (×4): qty 1

## 2014-10-21 MED ORDER — ONDANSETRON HCL 4 MG/2ML IJ SOLN: 4.0000 mg | Freq: Four times a day (QID) | INTRAMUSCULAR | Status: DC | PRN

## 2014-10-21 MED ORDER — DEXTROSE 5 % IV SOLN
1.0000 g | Freq: Once | INTRAVENOUS | Status: AC
  Administered 2014-10-21: 1 g via INTRAVENOUS
  Filled 2014-10-21: qty 10

## 2014-10-21 MED ORDER — MIRTAZAPINE 15 MG PO TABS
15.0000 mg | ORAL_TABLET | Freq: Every day | ORAL | Status: DC
  Administered 2014-10-21 – 2014-10-27 (×7): 15 mg via ORAL
  Filled 2014-10-21 (×7): qty 1

## 2014-10-21 MED ORDER — VANCOMYCIN HCL IN DEXTROSE 1-5 GM/200ML-% IV SOLN
1000.0000 mg | INTRAVENOUS | Status: AC
  Administered 2014-10-21: 1000 mg via INTRAVENOUS
  Filled 2014-10-21: qty 200

## 2014-10-21 MED ORDER — PIPERACILLIN-TAZOBACTAM 3.375 G IVPB
3.3750 g | Freq: Three times a day (TID) | INTRAVENOUS | Status: DC
  Administered 2014-10-22 – 2014-10-28 (×19): 3.375 g via INTRAVENOUS
  Filled 2014-10-21 (×20): qty 50

## 2014-10-21 NOTE — ED Notes (Signed)
Attempted to give report, nurse will call back

## 2014-10-21 NOTE — ED Notes (Signed)
Pt reports she was sent here from [facility] for IVF.  She reports gen. Weakness x 2 days with SOB.  She states that she has a cyst in her L lung. Pt is A&Ox 4.  Denies any pain at this time.

## 2014-10-21 NOTE — Progress Notes (Signed)
ANTIBIOTIC CONSULT NOTE - INITIAL  Pharmacy Consult for Vancomycin, Zosyn Indication: rule out sepsis  Allergies  Allergen Reactions  . Paclitaxel Anaphylaxis    Pt with severe reaction 4 minutes into first paclitaxel infusion.   . Ibuprofen     REACTION: rash    Patient Measurements:   Weight 61.7 kg (10/21/14) Height 62 in  Vital Signs: Temp: 99.8 F (37.7 C) (07/28 1545) Temp Source: Oral (07/28 1545) BP: 122/80 mmHg (07/28 1947) Pulse Rate: 106 (07/28 1947) Intake/Output from previous day:   Intake/Output from this shift:    Labs:  Recent Labs  10/21/14 1610 10/21/14 1640  WBC 25.1*  --   HGB 11.9* 12.2  PLT 320  --   CREATININE  --  1.00   Estimated Creatinine Clearance: 47.1 mL/min (by C-G formula based on Cr of 1). No results for input(s): VANCOTROUGH, VANCOPEAK, VANCORANDOM, GENTTROUGH, GENTPEAK, GENTRANDOM, TOBRATROUGH, TOBRAPEAK, TOBRARND, AMIKACINPEAK, AMIKACINTROU, AMIKACIN in the last 72 hours.   Microbiology: Recent Results (from the past 720 hour(s))  Urine culture     Status: None   Collection Time: 09/27/14  4:25 PM  Result Value Ref Range Status   Specimen Description URINE, CLEAN CATCH  Final   Special Requests NONE  Final   Culture   Final    MULTIPLE SPECIES PRESENT, SUGGEST RECOLLECTION IF CLINICALLY INDICATED Performed at [facility]    Report Status 09/29/2014 FINAL  Final  Blood culture (routine x 2)     Status: None   Collection Time: 09/27/14  5:55 PM  Result Value Ref Range Status   Specimen Description BLOOD RIGHT ANTECUBITAL  Final   Special Requests BOTTLES DRAWN AEROBIC AND ANAEROBIC 5CC  Final   Culture   Final    NO GROWTH 5 DAYS Performed at [facility]    Report Status 10/02/2014 FINAL  Final    Medical History: Past Medical History  Diagnosis Date  . COPD (chronic obstructive pulmonary disease)   . GERD (gastroesophageal reflux disease)   . Hypertension   . Osteoarthritis   . Lung nodules      Medications:  Anti-infectives    Start     Dose/Rate Route Frequency Ordered Stop   10/21/14 1845  cefTRIAXone (ROCEPHIN) 1 g in dextrose 5 % 50 mL IVPB     1 g 100 mL/hr over 30 Minutes Intravenous  Once 10/21/14 1832 10/21/14 2106     Assessment: 22 yoF presents to [facility] ED on 7/28 with weakness, poor PO intake, and concern for sepsis.  She is followed at [facility] for history of COPD, and lung cancer with most recent chemo cycle completed in June 2016.  CXR without edema or consolidation.  Concern for possible pneumonia (lung mass may obstruct view) or UTI causing sepsis.  Pharmacy is consulted to dose vancomycin and Zosyn.  7/28 >> Ceftriaxone x1 7/28 >> Vanc >> 7/28 >> Zosyn >>    7/28 blood x2: sent 7/28 urine:  / sputum:   Dose changes/levels:  Today, 10/21/2014:  Tm 99.8  WBC 25.1  SCr 1, CrCl ~ 47 ml/min  Lactic acid 2.43   Goal of Therapy:  Vancomycin trough level 15-20 mcg/ml Appropriate abx dosing, eradication of infection.   Plan:   Zosyn 3.375g IV Q8H infused over 4hrs.  Vancomycin 1g IV x1 dose then '500mg'$  IV q12h  Measure Vanc trough at steady state.  Follow up renal fxn, culture results, and clinical course.  Gretta Arab PharmD, BCPS Pager [phone] 10/21/2014 10:03  PM

## 2014-10-21 NOTE — H&P (Signed)
[facility] History and Physical  Kelli Reyes QTM:[id number] DOB: Sep 06, 1945 DOA: 10/21/2014  Referring physician: Darl Householder.  PCP: Scarlette Calico, MD   Chief Complaint: weakness.  HPI: Kelli Reyes is a 69 y.o. female with PMH significant for COPD, Lung cancer follows at [facility], who was seen by PCP due to weakness. Patient relates poor appetite.  She has not been eating or drinking fluids for last few days. Her BP was found to be low at her PCP office in the 80.  She denies dysuria, she relates worsening of dry cough. Denies abdominal pain, diarrhea, chest pain.   Evaluation in the ED: Lactic acid at 2.4, glucose at 361, WBC at 25, UA with positive nitrates, small leukocytes, Chest x ary: Stable large left upper lobe mass. Evidence of prior granulomatous disease. No edema or consolidation. She received IV fluids and IV antibiotics in the Ed.    Review of Systems:  Negative, except as per HPI  Past Medical History  Diagnosis Date  . COPD (chronic obstructive pulmonary disease)   . GERD (gastroesophageal reflux disease)   . Hypertension   . Osteoarthritis   . Lung nodules    Past Surgical History  Procedure Laterality Date  . Abdominal hysterectomy    . Video bronchoscopy Bilateral 12/24/2013    Procedure: VIDEO BRONCHOSCOPY WITH FLUORO;  Surgeon: Tanda Rockers, MD;  Location: [facility]OSCOPY;  Service: Cardiopulmonary;  Laterality: Bilateral;  . Video bronchoscopy Bilateral 04/15/2014    Procedure: VIDEO BRONCHOSCOPY WITH FLUORO;  Surgeon: Tanda Rockers, MD;  Location: [facility]OSCOPY;  Service: Cardiopulmonary;  Laterality: Bilateral;   Social History:  reports that she quit smoking about 18 years ago. Her smoking use included Cigarettes. She has a 50 pack-year smoking history. She has never used smokeless tobacco. She reports that she does not drink alcohol or use illicit drugs.  Allergies  Allergen Reactions  . Paclitaxel Anaphylaxis    Pt with severe reaction 4 minutes into  first paclitaxel infusion.   . Ibuprofen     REACTION: rash    Family History  Problem Relation Age of Onset  . Hyperlipidemia Other   . Cancer Neg Hx   . Stroke Neg Hx   . Heart disease Neg Hx     Prior to Admission medications   Medication Sig Start Date End Date Taking? Authorizing Provider  albuterol (PROVENTIL HFA;VENTOLIN HFA) 108 (90 BASE) MCG/ACT inhaler Inhale 2 puffs into the lungs every 6 (six) hours as needed for wheezing or shortness of breath.   Yes Historical Provider, MD  beclomethasone (QVAR) 80 MCG/ACT inhaler Inhale 2 puffs into the lungs 2 (two) times daily.   Yes Historical Provider, MD  docusate sodium (COLACE) 100 MG capsule Take 100 mg by mouth 2 (two) times daily.   Yes Historical Provider, MD  FeAsp-B12-FA-C-DSS-SuccAc-Zn (FERIVA 21/7) 75-1 MG TABS Take 1 tablet by mouth daily. 10/05/14  Yes Janith Lima, MD  ipratropium (ATROVENT HFA) 17 MCG/ACT inhaler Inhale 2 puffs into the lungs every 6 (six) hours as needed for wheezing.   Yes Historical Provider, MD  losartan-hydrochlorothiazide (HYZAAR) 50-12.5 MG per tablet Take 1 tablet by mouth every morning. 08/10/14  Yes Janith Lima, MD  MELATONIN PO Take 1 tablet by mouth at bedtime.    Yes Historical Provider, MD  mirtazapine (REMERON) 15 MG tablet Take 15 mg by mouth at bedtime.   Yes Historical Provider, MD  omeprazole (PRILOSEC) 20 MG capsule Take 20 mg by mouth every evening.  Yes Historical Provider, MD  oxyCODONE (OXY IR/ROXICODONE) 5 MG immediate release tablet Take 5 mg by mouth every 4 (four) hours as needed for severe pain.   Yes Historical Provider, MD  predniSONE (DELTASONE) 10 MG tablet Take 10 mg by mouth daily with breakfast.   Yes Historical Provider, MD  PRESCRIPTION MEDICATION    Yes Historical Provider, MD  prochlorperazine (COMPAZINE) 10 MG tablet Take 10 mg by mouth every 6 (six) hours as needed for nausea or vomiting.   Yes Historical Provider, MD   Physical Exam: Filed Vitals:    10/21/14 1545 10/21/14 1723 10/21/14 1725 10/21/14 1947  BP: 108/71 120/75 120/75 122/80  Pulse: 125 111 108 106  Temp: 99.8 F (37.7 C)     TempSrc: Oral     Resp: '30 22 26 21  '$ SpO2: 92% 100% 99% 97%    Wt Readings from Last 3 Encounters:  10/21/14 61.689 kg (136 lb)  10/04/14 64.411 kg (142 lb)  04/29/14 65.318 kg (144 lb)    General:  Appears calm and comfortable, sluggish.  Eyes: PERRL, normal lids, irises & conjunctiva ENT: grossly normal hearing, lips & tongue Neck: no LAD, masses or thyromegaly Cardiovascular: RRR, no m/r/g. No LE edema. Telemetry: tachycardic Respiratory: CTA bilaterally, no w/r/r. Normal respiratory effort. Abdomen: soft, ntnd Skin: no rash or induration seen on limited exam Musculoskeletal: grossly normal tone BUE/BLE Psychiatric: grossly normal mood and affect, speech fluent and appropriate Neurologic: grossly non-focal.          Labs on Admission:  Basic Metabolic Panel:  Recent Labs Lab 10/21/14 1640  NA 134*  K 3.9  CL 97*  GLUCOSE 361*  BUN 24*  CREATININE 1.00   Liver Function Tests: No results for input(s): AST, ALT, ALKPHOS, BILITOT, PROT, ALBUMIN in the last 168 hours. No results for input(s): LIPASE, AMYLASE in the last 168 hours. No results for input(s): AMMONIA in the last 168 hours. CBC:  Recent Labs Lab 10/21/14 1610 10/21/14 1640  WBC 25.1*  --   NEUTROABS 23.0*  --   HGB 11.9* 12.2  HCT 37.6 36.0  MCV 93.3  --   PLT 320  --    Cardiac Enzymes: No results for input(s): CKTOTAL, CKMB, CKMBINDEX, TROPONINI in the last 168 hours.  BNP (last 3 results)  Recent Labs  07/05/14 1731 09/27/14 1502  BNP 127.6* 31.6    ProBNP (last 3 results)  Recent Labs  04/22/14 1426  PROBNP 21.0    CBG:  Recent Labs Lab 10/21/14 1957  GLUCAP 262*    Radiological Exams on Admission: Dg Chest Portable 1 View  10/21/2014   CLINICAL DATA:  Shortness of breath.  Known lung mass  EXAM: PORTABLE CHEST - 1 VIEW   COMPARISON:  Chest radiograph September 27, 2014 and chest CT September 27, 2014  FINDINGS: The large mass in the anterior segment of the left upper lobe is again noted, not appreciably changed given change in positioning and technique. Lungs elsewhere clear except for scattered calcified granulomas. Heart size and pulmonary vascularity are normal. There are calcified hilar and mediastinal lymph nodes. No adenopathy is appreciable radiographically. No bone lesions.  IMPRESSION: Stable large left upper lobe mass. Evidence of prior granulomatous disease. No edema or consolidation.   Electronically Signed   By: Lowella Grip III M.D.   On: 10/21/2014 16:26    EKG: Independently reviewed. Sinus tachycardia.   Assessment/Plan Active Problems:   Essential hypertension, benign   COPD GOLD II  Hyperglycemia   Primary cancer of left upper lobe of lung   Sepsis due to urinary tract infection   Sepsis  1-Sepsis: Patient presents with hypotension, lactic acid at 2.4, leukocytosis.  Infection could be related to UTI vs PNA unable to clearly see due to lung mass.  Blood culture, urine culture ordered.  IV vancomycin and Zosyn per pharmacy to dose.  She is on prednisone. Will start dose steroids, with hydrocortisone.  IV fluids.   2-Encephalopathy:  Patient lethargic, but answer questions.  Likely related to infectious process.   3-Lung cancer:  Follows at [facility], per PCP note, patient is not responding to chemotherapy.   4-Hyperglycemia; on prednisone. Check Hb A1c. Start SSI.   5-Hyponatremia; secondary to hypovolemia. Continue with IV fluids.   6-COPD gold II; Continue with nebulizer treatment.    Code Status: Full Code.  DVT Prophylaxis: Lovenox.  Family Communication: care discussed with patient.  Disposition Plan: expect 3 to 4 days inpatient.   Time spent: 75 minutes.   Niel Hummer A [facility] Pager [phone]

## 2014-10-21 NOTE — Progress Notes (Signed)
Subjective:  Patient ID: Kelli Reyes, female    DOB: Dec 25, 1945  Age: 69 y.o. MRN: [id number]  CC: Fatigue   HPI Kelli Reyes presents for for follow-up. She was recently seen at [facility] and her lung cancer is not responding to chemotherapy so they are considering doing radiation therapy. She comes in today stating that when she was at [facility] she was given insulin. She states the last few days she has not had anything to eat or drink. She feels weak, fatigued, and lethargic. She has a loss of appetite and weight loss. She denies any nausea, vomiting, diarrhea, or abdominal pain.  No facility-administered medications prior to visit.   Outpatient Prescriptions Prior to Visit  Medication Sig Dispense Refill  . albuterol (PROVENTIL HFA;VENTOLIN HFA) 108 (90 BASE) MCG/ACT inhaler Inhale 2 puffs into the lungs every 6 (six) hours as needed for wheezing or shortness of breath.    . beclomethasone (QVAR) 80 MCG/ACT inhaler Inhale 2 puffs into the lungs 2 (two) times daily.    Marland Kitchen docusate sodium (COLACE) 100 MG capsule Take 100 mg by mouth 2 (two) times daily.    . FeAsp-B12-FA-C-DSS-SuccAc-Zn (FERIVA 21/7) 75-1 MG TABS Take 1 tablet by mouth daily. 28 tablet 11  . ipratropium (ATROVENT HFA) 17 MCG/ACT inhaler Inhale 2 puffs into the lungs every 6 (six) hours as needed for wheezing.    Marland Kitchen losartan-hydrochlorothiazide (HYZAAR) 50-12.5 MG per tablet Take 1 tablet by mouth every morning. 90 tablet 1  . MELATONIN PO Take by mouth.    . mirtazapine (REMERON) 15 MG tablet Take 15 mg by mouth at bedtime.    Marland Kitchen omeprazole (PRILOSEC) 20 MG capsule Take 20 mg by mouth every evening.    Marland Kitchen oxyCODONE (OXY IR/ROXICODONE) 5 MG immediate release tablet Take 5 mg by mouth every 4 (four) hours as needed for severe pain.    . predniSONE (DELTASONE) 10 MG tablet Take 10 mg by mouth daily with breakfast.    . PRESCRIPTION MEDICATION     . prochlorperazine (COMPAZINE) 10 MG tablet Take 10 mg by mouth every 6 (six) hours  as needed for nausea or vomiting.      ROS Review of Systems  Constitutional: Positive for activity change, appetite change, fatigue and unexpected weight change. Negative for fever, chills and diaphoresis.  HENT: Negative.  Negative for trouble swallowing and voice change.   Eyes: Negative.   Respiratory: Positive for shortness of breath. Negative for cough, choking, chest tightness, wheezing and stridor.   Cardiovascular: Positive for chest pain. Negative for palpitations and leg swelling.  Gastrointestinal: Negative.  Negative for nausea, vomiting, abdominal pain, diarrhea, constipation and blood in stool.  Endocrine: Negative.   Genitourinary: Negative.  Negative for difficulty urinating.  Musculoskeletal: Negative.   Skin: Negative.   Allergic/Immunologic: Negative.   Neurological: Positive for dizziness and weakness. Negative for tremors, seizures, light-headedness and headaches.  Hematological: Negative.  Negative for adenopathy. Does not bruise/bleed easily.  Psychiatric/Behavioral: Negative.     Objective:  BP 80/50 mmHg  Pulse 131  Temp(Src) 99 F (37.2 C) (Oral)  Resp 20  Ht '5\' 2"'$  (1.575 m)  Wt 136 lb (61.689 kg)  BMI 24.87 kg/m2  SpO2 92%  BP Readings from Last 3 Encounters:  10/21/14 108/71  10/21/14 80/50  10/04/14 118/72    Wt Readings from Last 3 Encounters:  10/21/14 136 lb (61.689 kg)  10/04/14 142 lb (64.411 kg)  04/29/14 144 lb (65.318 kg)    Physical Exam  Constitutional:  Non-toxic appearance. She has a sickly appearance. She appears ill. She appears distressed.  She is lethargic, she has difficulty completing sentences. She is hypotensive and tachycardic.  HENT:  Mouth/Throat: Oropharynx is clear and moist. Mucous membranes are pale, dry and not cyanotic. No oropharyngeal exudate, posterior oropharyngeal edema, posterior oropharyngeal erythema or tonsillar abscesses.  Eyes: Conjunctivae are normal. Right eye exhibits no discharge. Left eye  exhibits no discharge. No scleral icterus.  Neck: Normal range of motion. Neck supple. No JVD present. No tracheal deviation present. No thyromegaly present.  Cardiovascular: Normal rate, regular rhythm, normal heart sounds and intact distal pulses.  Exam reveals no gallop and no friction rub.   No murmur heard. Pulmonary/Chest: No accessory muscle usage or stridor. No tachypnea. No respiratory distress. She has decreased breath sounds in the right middle field and the left middle field. She has no wheezes. She has no rhonchi. She has no rales.  Abdominal: Soft. Bowel sounds are normal. She exhibits no distension and no mass. There is no tenderness. There is no rebound and no guarding.  Musculoskeletal: Normal range of motion. She exhibits no edema or tenderness.  Lymphadenopathy:    She has no cervical adenopathy.  Skin: Skin is warm and dry. No rash noted. No erythema. There is pallor.  Her skin is cool and clammy    Lab Results  Component Value Date   WBC 24.5 cH* 10/04/2014   HGB 12.6 10/04/2014   HCT 38.5 10/04/2014   PLT 466.0* 10/04/2014   GLUCOSE 219* 10/04/2014   CHOL 234* 06/29/2013   TRIG 139.0 06/29/2013   HDL 42.90 06/29/2013   LDLDIRECT 65.3 07/15/2012   LDLCALC 163* 06/29/2013   ALT 22 07/05/2014   AST 16 07/05/2014   NA 133* 10/04/2014   K 3.6 10/04/2014   CL 97 10/04/2014   CREATININE 1.05 10/04/2014   BUN 15 10/04/2014   CO2 24 10/04/2014   TSH 1.17 10/04/2014   INR 1.1* 08/04/2012   HGBA1C 6.7* 10/04/2014    No results found.  Assessment & Plan:   Kelli Reyes was seen today for fatigue.  Diagnoses and all orders for this visit:  Hypotensive episode- she appears to be hyperglycemic and dehydrated. She will go directly to the ER for further evaluation and treatment. Orders: -     Basic metabolic panel; Future  I am having Kelli Reyes maintain her prochlorperazine, beclomethasone, ipratropium, docusate sodium, PRESCRIPTION MEDICATION,  losartan-hydrochlorothiazide, mirtazapine, predniSONE, oxyCODONE, albuterol, omeprazole, MELATONIN PO, and FERIVA 21/7.  No orders of the defined types were placed in this encounter.     Follow-up: No Follow-up on file.  Scarlette Calico, MD

## 2014-10-21 NOTE — ED Provider Notes (Signed)
CSN: [id number]     Arrival date & time 10/21/14  1452 History   First MD Initiated Contact with Patient 10/21/14 1552     Chief Complaint  Patient presents with  . needs IVF      (Consider location/radiation/quality/duration/timing/severity/associated sxs/prior Treatment) HPI   69 year old female with history of lung cancer with chemotherapy month ago, COPD and hypertension sent from doctor's office for evaluations of general weakness. Patient reports she has gone through chemotherapy therapy for the past month. She was told that she will need radiation. She went for a regular doctor's visit today. She was found to be hypotensive and tachycardic and subsequently sent here for further evaluation. She mentioned the past 2 days she has had no appetite, not eating and drinking, and sleeping mostly. Aside from chronic shortness of breath and basically she has no other complaint. She denies fever, chills, headache, URI symptoms, chest pain, nausea vomiting diarrhea, dysuria, focal numbness or weakness. Patient lives at home by herself.  Past Medical History  Diagnosis Date  . COPD (chronic obstructive pulmonary disease)   . GERD (gastroesophageal reflux disease)   . Hypertension   . Osteoarthritis   . Lung nodules    Past Surgical History  Procedure Laterality Date  . Abdominal hysterectomy    . Video bronchoscopy Bilateral 12/24/2013    Procedure: VIDEO BRONCHOSCOPY WITH FLUORO;  Surgeon: Tanda Rockers, MD;  Location: [facility];  Service: Cardiopulmonary;  Laterality: Bilateral;  . Video bronchoscopy Bilateral 04/15/2014    Procedure: VIDEO BRONCHOSCOPY WITH FLUORO;  Surgeon: Tanda Rockers, MD;  Location: [facility];  Service: Cardiopulmonary;  Laterality: Bilateral;   Family History  Problem Relation Age of Onset  . Hyperlipidemia Other   . Cancer Neg Hx   . Stroke Neg Hx   . Heart disease Neg Hx    History  Substance Use Topics  . Smoking status: Former Smoker -- 2.00  packs/day for 25 years    Types: Cigarettes    Quit date: 09/03/1996  . Smokeless tobacco: Never Used  . Alcohol Use: No     Comment: beer 2-3 times a week   OB History    No data available     Review of Systems  All other systems reviewed and are negative.     Allergies  Paclitaxel and Ibuprofen  Home Medications   Prior to Admission medications   Medication Sig Start Date End Date Taking? Authorizing Provider  albuterol (PROVENTIL HFA;VENTOLIN HFA) 108 (90 BASE) MCG/ACT inhaler Inhale 2 puffs into the lungs every 6 (six) hours as needed for wheezing or shortness of breath.    Historical Provider, MD  beclomethasone (QVAR) 80 MCG/ACT inhaler Inhale 2 puffs into the lungs 2 (two) times daily.    Historical Provider, MD  docusate sodium (COLACE) 100 MG capsule Take 100 mg by mouth 2 (two) times daily.    Historical Provider, MD  FeAsp-B12-FA-C-DSS-SuccAc-Zn (FERIVA 21/7) 75-1 MG TABS Take 1 tablet by mouth daily. 10/05/14   Janith Lima, MD  ipratropium (ATROVENT HFA) 17 MCG/ACT inhaler Inhale 2 puffs into the lungs every 6 (six) hours as needed for wheezing.    Historical Provider, MD  losartan-hydrochlorothiazide (HYZAAR) 50-12.5 MG per tablet Take 1 tablet by mouth every morning. 08/10/14   Janith Lima, MD  MELATONIN PO Take by mouth.    Historical Provider, MD  mirtazapine (REMERON) 15 MG tablet Take 15 mg by mouth at bedtime.    Historical Provider, MD  omeprazole ([place])  20 MG capsule Take 20 mg by mouth every evening.    Historical Provider, MD  oxyCODONE (OXY IR/ROXICODONE) 5 MG immediate release tablet Take 5 mg by mouth every 4 (four) hours as needed for severe pain.    Historical Provider, MD  predniSONE (DELTASONE) 10 MG tablet Take 10 mg by mouth daily with breakfast.    Historical Provider, MD  [place] Provider, MD  prochlorperazine (COMPAZINE) 10 MG tablet Take 10 mg by mouth every 6 (six) hours as needed for nausea or vomiting.     Historical Provider, MD   BP 108/71 mmHg  Pulse 125  Temp(Src) 99.8 F (37.7 C) (Oral)  Resp 30  SpO2 92% Physical Exam  Constitutional: She is oriented to person, place, and time. She appears well-developed and well-nourished. No distress.  African American female, laying in bed, appears fatigued.  HENT:  Head: Atraumatic.  Mouth is dry.  Eyes: Conjunctivae are normal.  Neck: Neck supple.  Cardiovascular:  Tachycardia without murmurs rubs or gallops.  Pulmonary/Chest:  Breathing with poor effort but no accessory muscle use, no wheezes, rales, or rhonchi appreciated.  Abdominal: Soft. There is no tenderness.  Musculoskeletal: She exhibits no edema.  Neurological: She is alert and oriented to person, place, and time.  Skin: No rash noted.  Poor skin turgor.  Psychiatric: She has a normal mood and affect.  Nursing note and vitals reviewed.   ED Course  Procedures (including critical care time)  Patient here with poor appetite, not eating and drinking for the past several days. She appears dehydrated. She is hypotensive and tachycardic. Will obtain labs, IV hydration and plan for admission for further care. Will look for possible source of infection.    6:34 PM Blood pressure improves to 80/50-120/75 after patient receiving 2 L of fluid. Her tachycardia has also suggested possible urinary tract infection. However patient has 0-2 WBC and only few bacteria on her urine. She however has a leukocytosis of 25.1. She has had leukocytosis with similar values from prior visits. She does not have any significant abdominal discomfort on exam. Lactic acid is 2.43, reflecting dehydration and possible infection. Therefore, Rocephin will be administered and patient will be admitted for further care.  Initial CBG 361.  After IVF CBG improves to 262.  Blood culture and urine cultures ordered.   7:59 PM Appreciate consultation from [facility] Dr. Tyrell Antonio who agrees to admit pt to step down  for further management of suspect urosepsis.    CRITICAL CARE Performed by: Domenic Moras Total critical care time: 40 min Critical care time was exclusive of separately billable procedures and treating other patients. Critical care was necessary to treat or prevent imminent or life-threatening deterioration. Critical care was time spent personally by me on the following activities: development of treatment plan with patient and/or surrogate as well as nursing, discussions with consultants, evaluation of patient's response to treatment, examination of patient, obtaining history from patient or surrogate, ordering and performing treatments and interventions, ordering and review of laboratory studies, ordering and review of radiographic studies, pulse oximetry and re-evaluation of patient's condition.   Labs Review Labs Reviewed  CBC WITH DIFFERENTIAL/PLATELET - Abnormal; Notable for the following:    WBC 25.1 (*)    Hemoglobin 11.9 (*)    Neutrophils Relative % 92 (*)    Lymphocytes Relative 3 (*)    Neutro Abs 23.0 (*)    Monocytes Absolute 1.3 (*)    All other components within normal  limits  URINALYSIS, ROUTINE W REFLEX MICROSCOPIC (NOT AT [facility]) - Abnormal; Notable for the following:    Color, Urine ORANGE (*)    APPearance CLOUDY (*)    Bilirubin Urine MODERATE (*)    Ketones, ur 15 (*)    Protein, ur 100 (*)    Urobilinogen, UA 4.0 (*)    Nitrite POSITIVE (*)    Leukocytes, UA SMALL (*)    All other components within normal limits  URINE MICROSCOPIC-ADD ON - Abnormal; Notable for the following:    Bacteria, UA FEW (*)    Casts HYALINE CASTS (*)    All other components within normal limits  I-STAT CHEM 8, ED - Abnormal; Notable for the following:    Sodium 134 (*)    Chloride 97 (*)    BUN 24 (*)    Glucose, Bld 361 (*)    Calcium, Ion 1.39 (*)    All other components within normal limits  I-STAT CG4 LACTIC ACID, ED - Abnormal; Notable for the following:    Lactic Acid,  Venous 2.43 (*)    All other components within normal limits  I-STAT TROPOININ, ED    Imaging Review Dg Chest Portable 1 View  10/21/2014   CLINICAL DATA:  Shortness of breath.  Known lung mass  EXAM: PORTABLE CHEST - 1 VIEW  COMPARISON:  Chest radiograph September 27, 2014 and chest CT September 27, 2014  FINDINGS: The large mass in the anterior segment of the left upper lobe is again noted, not appreciably changed given change in positioning and technique. Lungs elsewhere clear except for scattered calcified granulomas. Heart size and pulmonary vascularity are normal. There are calcified hilar and mediastinal lymph nodes. No adenopathy is appreciable radiographically. No bone lesions.  IMPRESSION: Stable large left upper lobe mass. Evidence of prior granulomatous disease. No edema or consolidation.   Electronically Signed   By: Lowella Grip III M.D.   On: 10/21/2014 16:26     EKG Interpretation None     ED ECG REPORT   Date: 10/21/2014  Rate: 127  Rhythm: sinus tachycardia  QRS Axis: normal  Intervals: normal  ST/T Wave abnormalities: normal  Conduction Disutrbances:none  Narrative Interpretation:   Old EKG Reviewed: unchanged  I have personally reviewed the EKG tracing and agree with the computerized printout as noted.   MDM   Final diagnoses:  Hypotensive episode  Sepsis due to urinary tract infection    BP 122/80 mmHg  Pulse 106  Temp(Src) 99.8 F (37.7 C) (Oral)  Resp 21  SpO2 97%  I have reviewed nursing notes and vital signs. I personally viewed the imaging tests through PACS system and agrees with radiologist's intepretation I reviewed available ER/hospitalization records through the EMR     Domenic Moras, PA-C 10/21/14 2002  Ernestina Patches, MD 10/22/14 0040

## 2014-10-22 DIAGNOSIS — D630 Anemia in neoplastic disease: Secondary | ICD-10-CM | POA: Diagnosis present

## 2014-10-22 LAB — MRSA PCR SCREENING: MRSA by PCR: NEGATIVE

## 2014-10-22 LAB — CBC
HEMATOCRIT: 32 % — AB (ref 36.0–46.0)
Hemoglobin: 9.6 g/dL — ABNORMAL LOW (ref 12.0–15.0)
MCH: 28.5 pg (ref 26.0–34.0)
MCHC: 30 g/dL (ref 30.0–36.0)
MCV: 95 fL (ref 78.0–100.0)
Platelets: 290 10*3/uL (ref 150–400)
RBC: 3.37 MIL/uL — AB (ref 3.87–5.11)
RDW: 15.7 % — ABNORMAL HIGH (ref 11.5–15.5)
WBC: 20.2 10*3/uL — AB (ref 4.0–10.5)

## 2014-10-22 LAB — BASIC METABOLIC PANEL
Anion gap: 8 (ref 5–15)
BUN: 17 mg/dL (ref 6–20)
CALCIUM: 9.5 mg/dL (ref 8.9–10.3)
CHLORIDE: 106 mmol/L (ref 101–111)
CO2: 24 mmol/L (ref 22–32)
CREATININE: 0.88 mg/dL (ref 0.44–1.00)
GFR calc Af Amer: >60 mL/min (ref >60)
Glucose, Bld: 291 mg/dL — ABNORMAL HIGH (ref 65–99)
Potassium: 4 mmol/L (ref 3.5–5.1)
Sodium: 138 mmol/L (ref 135–145)

## 2014-10-22 LAB — GLUCOSE, CAPILLARY
GLUCOSE-CAPILLARY: 316 mg/dL — AB (ref 65–99)
Glucose-Capillary: 262 mg/dL — ABNORMAL HIGH (ref 65–99)
Glucose-Capillary: 369 mg/dL — ABNORMAL HIGH (ref 65–99)
Glucose-Capillary: 330 mg/dL — ABNORMAL HIGH (ref 65–99)

## 2014-10-22 MED ORDER — HYDROCORTISONE NA SUCCINATE PF 100 MG IJ SOLR
50.0000 mg | Freq: Two times a day (BID) | INTRAMUSCULAR | Status: DC
  Administered 2014-10-22 – 2014-10-23 (×3): 50 mg via INTRAVENOUS
  Filled 2014-10-22 (×4): qty 1

## 2014-10-22 MED ORDER — INSULIN ASPART 100 UNIT/ML ~~LOC~~ SOLN
0.0000 [IU] | Freq: Every day | SUBCUTANEOUS | Status: DC
  Administered 2014-10-22: 4 [IU] via SUBCUTANEOUS

## 2014-10-22 MED ORDER — INSULIN ASPART 100 UNIT/ML ~~LOC~~ SOLN
0.0000 [IU] | Freq: Three times a day (TID) | SUBCUTANEOUS | Status: DC
  Administered 2014-10-22: 15 [IU] via SUBCUTANEOUS
  Administered 2014-10-22: 20 [IU] via SUBCUTANEOUS
  Administered 2014-10-22 – 2014-10-23 (×2): 11 [IU] via SUBCUTANEOUS

## 2014-10-22 NOTE — Progress Notes (Signed)
Progress Note   Kelli Reyes ZOX:[id number] DOB: June 05, 1945 DOA: 10/21/2014 PCP: Scarlette Calico, MD   Brief Narrative:   Kelli Reyes is an 69 y.o. female PMH of COPD, lung cancer (follows at [facility]) was admitted 10/21/58 with a chief complaint of weakness and anorexia. On initial evaluation, the patient was found to be hypotensive with pressures in the 80s. Lactic acid was elevated at 2.4, WBC 25, urinalysis with positive nitrates and small leukocytes, chest x-ray showing a large left upper lobe mass.  Assessment/Plan:   Principal Problem:   Sepsis secondary to UTI versus postobstructive pneumonia - Follow-up blood and urine cultures. - Continue empiric vancomycin and Zosyn pending culture data. - Continue stress dose steroids in the setting of hypotension, and wean with blood pressure stability. - Currently on empiric vancomycin and Zosyn. - WBC improving and blood pressure now stable. Transfer to telemetry bed.  Active Problems:   Anemia in neoplastic disease - 2.3 g drop in hemoglobin overnight likely secondary to dilutional factors. - Monitor and transfuse as needed.    Acute encephalopathy - Likely secondary to infectious process. Treat underlying infection and monitor.    Essential hypertension, benign - Antihypertensives currently on hold. Takes Hyzaar at baseline.    COPD GOLD II - Continue Pulmicort and broncho-dilators.    Hyperglycemia - Steroid-induced. Currently on SSI, sensitive scale, 3 times a day. CBG 187-262. - Change SSI to insulin resistant scale 4 times a day.    Primary cancer of left upper lobe of lung - Follows at [facility]. Diagnosed 04/2014. Has undergone neo-adjuvant chemotherapy with carboplatinum and gemcitabine. - Plan was to pursue concurrent chemoradiation if the patient's condition improves.    DVT Prophylaxis - Lovenox ordered.  Family Communication: Son updated at the bedside. Disposition Plan: Home when stable and off IV antibiotics,  likely another 24-48 hours if blood pressure stable and tachycardia improved. Code Status:     Code Status Orders        Start     Ordered   10/21/14 2213  Full code   Continuous     10/21/14 2212        IV Access:    Peripheral IV   Procedures and diagnostic studies:   Dg Chest Portable 1 View  10/21/2014   CLINICAL DATA:  Shortness of breath.  Known lung mass  EXAM: PORTABLE CHEST - 1 VIEW  COMPARISON:  Chest radiograph September 27, 2014 and chest CT September 27, 2014  FINDINGS: The large mass in the anterior segment of the left upper lobe is again noted, not appreciably changed given change in positioning and technique. Lungs elsewhere clear except for scattered calcified granulomas. Heart size and pulmonary vascularity are normal. There are calcified hilar and mediastinal lymph nodes. No adenopathy is appreciable radiographically. No bone lesions.  IMPRESSION: Stable large left upper lobe mass. Evidence of prior granulomatous disease. No edema or consolidation.   Electronically Signed   By: Lowella Grip III M.D.   On: 10/21/2014 16:26     Medical Consultants:    None.  Anti-Infectives:    Vancomycin 10/21/14--->  Zosyn 10/21/14--->  Subjective:    Kelli Reyes   Objective:    Filed Vitals:   10/22/14 0000 10/22/14 0200 10/22/14 0400 10/22/14 0500  BP: 149/85 141/76 136/100   Pulse: 105 94 109   Temp:    97.7 F (36.5 C)  TempSrc:    Oral  Resp: '25 23 23   '$ Height:  Weight:      SpO2: 97% 96% 95%     Intake/Output Summary (Last 24 hours) at 10/22/14 0736 Last data filed at 10/22/14 0600  Gross per 24 hour  Intake   2170 ml  Output    350 ml  Net   1820 ml    Exam: Gen:  NAD Cardiovascular:  Tachycardic, No M/R/G Respiratory:  Lungs CTAB Gastrointestinal:  Abdomen soft, NT/ND, + BS Extremities:  Clubbing present   Data Reviewed:    Labs: Basic Metabolic Panel:  Recent Labs Lab 10/21/14 1640 10/22/14 0401  NA 134* 138  K 3.9 4.0   CL 97* 106  CO2  --  24  GLUCOSE 361* 291*  BUN 24* 17  CREATININE 1.00 0.88  CALCIUM  --  9.5   GFR Estimated Creatinine Clearance: 54.7 mL/min (by C-G formula based on Cr of 0.88).  CBC:  Recent Labs Lab 10/21/14 1610 10/21/14 1640 10/22/14 0401  WBC 25.1*  --  20.2*  NEUTROABS 23.0*  --   --   HGB 11.9* 12.2 9.6*  HCT 37.6 36.0 32.0*  MCV 93.3  --  95.0  PLT 320  --  290   BNP (last 3 results)  Recent Labs  04/22/14 1426  PROBNP 21.0   CBG:  Recent Labs Lab 10/21/14 1957 10/21/14 2235  GLUCAP 262* 187*   Sepsis Labs:  Recent Labs Lab 10/21/14 1610 10/21/14 1641 10/22/14 0401  WBC 25.1*  --  20.2*  LATICACIDVEN  --  2.43*  --    Microbiology Recent Results (from the past 240 hour(s))  MRSA PCR Screening     Status: None   Collection Time: 10/21/14 10:29 PM  Result Value Ref Range Status   MRSA by PCR NEGATIVE NEGATIVE Final    Comment:        The GeneXpert MRSA Assay (FDA approved for NASAL specimens only), is one component of a comprehensive MRSA colonization surveillance program. It is not intended to diagnose MRSA infection nor to guide or monitor treatment for MRSA infections.      Medications:   . antiseptic oral rinse  7 mL Mouth Rinse BID  . budesonide (PULMICORT) nebulizer solution  0.5 mg Nebulization BID  . docusate sodium  100 mg Oral BID  . enoxaparin (LOVENOX) injection  40 mg Subcutaneous QHS  . hydrocortisone sod succinate (SOLU-CORTEF) inj  100 mg Intravenous Q6H  . insulin aspart  0-9 Units Subcutaneous TID WC  . ipratropium-albuterol  3 mL Nebulization TID  . mirtazapine  15 mg Oral QHS  . pantoprazole  40 mg Oral Daily  . piperacillin-tazobactam (ZOSYN)  IV  3.375 g Intravenous Q8H  . vancomycin  500 mg Intravenous Q12H   Continuous Infusions: . sodium chloride 1,000 mL (10/21/14 2224)    Time spent: 35 minutes with > 50% of time discussing current diagnostic test results, clinical impression and plan of  care.   LOS: 1 day   RAMA,CHRISTINA  [facility] Pager [phone]. If unable to reach me by pager, please call my cell phone at [phone].  *Please refer to [email or web address], password [email or web address] to get updated schedule on who will round on this patient, as hospitalists switch teams weekly. If 7PM-7AM, please contact night-coverage at [email or web address], password [email or web address] for any overnight needs.  10/22/2014, 7:36 AM

## 2014-10-22 NOTE — Progress Notes (Signed)
Nutrition Brief Note  Patient identified on the Malnutrition Screening Tool (MST) Report  Wt Readings from Last 15 Encounters:  10/21/14 142 lb 10.2 oz (64.7 kg)  10/21/14 136 lb (61.689 kg)  10/04/14 142 lb (64.411 kg)  04/29/14 144 lb (65.318 kg)  04/22/14 142 lb (64.411 kg)  04/01/14 144 lb (65.318 kg)  03/04/14 136 lb (61.689 kg)  02/25/14 139 lb (63.05 kg)  02/19/14 138 lb (62.596 kg)  01/21/14 145 lb (65.772 kg)  01/08/14 144 lb (65.318 kg)  01/07/14 142 lb (64.411 kg)  12/26/13 140 lb 3.2 oz (63.594 kg)  12/18/13 142 lb (64.411 kg)  12/07/13 144 lb (65.318 kg)    Body mass index is 26.08 kg/(m^2). Patient meets criteria for overweight based on current BMI.   Per note in MST section, pt reports 4 lb weight loss recently. Per weight hx review, weight has been stable since 12/07/13.  Current diet order is Carb Modified. Labs and medications reviewed.   No nutrition interventions warranted at this time. If nutrition issues arise, please consult RD.      Jarome Matin, RD, LDN Inpatient Clinical Dietitian Pager # [phone] After hours/weekend pager # [phone]

## 2014-10-22 NOTE — Progress Notes (Addendum)
Inpatient Diabetes Program Recommendations  AACE/ADA: New Consensus Statement on Inpatient Glycemic Control (2013)  Target Ranges:  Prepandial:   less than 140 mg/dL      Peak postprandial:   less than 180 mg/dL (1-2 hours)      Critically ill patients:  140 - 180 mg/dL   Results for EMMI, WERTHEIM (MRN [id number]) as of 10/22/2014 08:09  Ref. Range 10/04/2014 15:28  Hemoglobin A1C Latest Ref Range: 4.6-6.5 % 6.7 (H)   Results for KATHELYN, GOMBOS (MRN [id number]) as of 10/22/2014 08:09  Ref. Range 10/21/2014 19:57 10/21/2014 22:35  Glucose-Capillary Latest Ref Range: 65-99 mg/dL 262 (H) 187 (H)    Admit with: Sepsis  History: COPD, Lung Cancer, DM2 (diagnosed at PCP visit on 10/04/14)  Home DM Meds: None  Current DM Orders: Novolog Resistant SSI (0-20 units) TID AC + HS    -Note per Chart Review, patient saw her PCP (Dr. Ronnald Ramp with [facility]) on 10/04/14.  Was diagnosed with DM at that visit but was not started on any DM medications yet.  -Just had an A1c done on 07/11.  Result was 6.7%.  Not sure why another A1c has been drawn during this admission.  May want to cancel this A1c.  -Note patient currently receiving IV steroids.  Also note Novolog SSI increased to Resistant scale today.  -Will see patient today to discuss recent diagnosis of DM.    1420 Addendum: Spoke with patient and patient's daughter about patient's elevated CBGs.  Asked patient about her visit with her PCP (Dr. Ronnald Ramp) on 10/04/14.  Patient told me her PCP did not tell her she has DM and that he did not tell her much about her blood sugars.  Explained to patient that her glucose levels are elevated likely due to the steroids she is receiving.  Note that patient is taking Prednisone at home.  Explained how steroids have a tendency to raise a person's blood sugar levels and that we will be monitoring her glucose levels closely in the hospital and that we will be giving her insulin to bring her glucose levels back to  normal.  Explained to pt that IV Solumedrol is a very strong steroid and that the doctor here in the hospital may have to increase her insulin doses to keep her CBGs under control.    Spoke to patient about her A1c of 6.7% that was drawn at her PCPs office on 10/04/14.  Explained what an A1c is and what it measures.  Gave patient a pamphlet on basic DM information.  Reviewed CBG goals with patient in the hospital setting.  Explained to patient that the doctor managing her care here will follow her glucose levels closely and will make the determination if she does have DM.  Patient and daughter appreciative of my visit.   Will follow Wyn Quaker RN, MSN, CDE Diabetes Coordinator Inpatient Glycemic Control Team Team Pager: [phone] (8a-5p)

## 2014-10-22 NOTE — Care Management Note (Signed)
Case Management Note  Patient Details  Name: Kelli Reyes MRN: [id number] Date of Birth: Jun 04, 1945  Subjective/Objective:                 sepsis   Action/Plan:Date:  October 22, 2014 U.R. performed for needs and level of care. Will continue to follow for Case Management needs.  Velva Harman, RN, BSN, [place]   [phone]   Expected Discharge Date:   (unknown)               Expected Discharge Plan:  Home/Self Care  In-House Referral:  NA  Discharge planning Services  CM Consult  Post Acute Care Choice:  NA Choice offered to:  NA  DME Arranged:  N/A DME Agency:  NA  HH Arranged:  NA HH Agency:  NA  Status of Service:  In process, will continue to follow  Medicare Important Message Given:    Date Medicare IM Given:    Medicare IM give by:    Date Additional Medicare IM Given:    Additional Medicare Important Message give by:     If discussed at [place] of Stay Meetings, dates discussed:    Additional Comments:  Leeroy Cha, RN 10/22/2014, 10:20 AM

## 2014-10-23 LAB — CBC
HEMATOCRIT: 29.2 % — AB (ref 36.0–46.0)
Hemoglobin: 8.9 g/dL — ABNORMAL LOW (ref 12.0–15.0)
MCH: 28.3 pg (ref 26.0–34.0)
MCHC: 30.5 g/dL (ref 30.0–36.0)
MCV: 92.7 fL (ref 78.0–100.0)
Platelets: 246 10*3/uL (ref 150–400)
RBC: 3.15 MIL/uL — ABNORMAL LOW (ref 3.87–5.11)
RDW: 15 % (ref 11.5–15.5)
WBC: 16.7 10*3/uL — AB (ref 4.0–10.5)

## 2014-10-23 LAB — BASIC METABOLIC PANEL
Anion gap: 8 (ref 5–15)
BUN: 17 mg/dL (ref 6–20)
CO2: 21 mmol/L — ABNORMAL LOW (ref 22–32)
CREATININE: 1.03 mg/dL — AB (ref 0.44–1.00)
Calcium: 9.1 mg/dL (ref 8.9–10.3)
Chloride: 113 mmol/L — ABNORMAL HIGH (ref 101–111)
GFR calc Af Amer: >60 mL/min (ref >60)
GFR, EST NON AFRICAN AMERICAN: 55 mL/min — AB (ref >60)
Glucose, Bld: 342 mg/dL — ABNORMAL HIGH (ref 65–99)
Potassium: 3.4 mmol/L — ABNORMAL LOW (ref 3.5–5.1)
Sodium: 142 mmol/L (ref 135–145)

## 2014-10-23 LAB — URINE CULTURE: CULTURE: NO GROWTH

## 2014-10-23 LAB — HEMOGLOBIN A1C
Hgb A1c MFr Bld: 7.9 % — ABNORMAL HIGH (ref 4.8–5.6)
Mean Plasma Glucose: 180 mg/dL

## 2014-10-23 LAB — GLUCOSE, CAPILLARY
GLUCOSE-CAPILLARY: 236 mg/dL — AB (ref 65–99)
GLUCOSE-CAPILLARY: 169 mg/dL — AB (ref 65–99)
Glucose-Capillary: 292 mg/dL — ABNORMAL HIGH (ref 65–99)
Glucose-Capillary: 280 mg/dL — ABNORMAL HIGH (ref 65–99)

## 2014-10-23 LAB — VANCOMYCIN, TROUGH: VANCOMYCIN TR: 16 ug/mL (ref 10.0–20.0)

## 2014-10-23 MED ORDER — FUROSEMIDE 10 MG/ML IJ SOLN
40.0000 mg | Freq: Once | INTRAMUSCULAR | Status: AC
  Administered 2014-10-23: 40 mg via INTRAVENOUS
  Filled 2014-10-23: qty 4

## 2014-10-23 MED ORDER — INSULIN GLARGINE 100 UNIT/ML ~~LOC~~ SOLN
15.0000 [IU] | Freq: Every day | SUBCUTANEOUS | Status: DC
  Administered 2014-10-23 – 2014-10-27 (×5): 15 [IU] via SUBCUTANEOUS
  Filled 2014-10-23 (×6): qty 0.15

## 2014-10-23 MED ORDER — POTASSIUM CHLORIDE CRYS ER 20 MEQ PO TBCR
20.0000 meq | EXTENDED_RELEASE_TABLET | Freq: Two times a day (BID) | ORAL | Status: AC
  Administered 2014-10-23 (×2): 20 meq via ORAL
  Filled 2014-10-23 (×2): qty 1

## 2014-10-23 MED ORDER — INSULIN ASPART 100 UNIT/ML ~~LOC~~ SOLN
0.0000 [IU] | Freq: Every day | SUBCUTANEOUS | Status: DC
  Administered 2014-10-24 – 2014-10-25 (×2): 2 [IU] via SUBCUTANEOUS

## 2014-10-23 MED ORDER — INSULIN ASPART 100 UNIT/ML ~~LOC~~ SOLN
0.0000 [IU] | Freq: Three times a day (TID) | SUBCUTANEOUS | Status: DC
  Administered 2014-10-23: 11 [IU] via SUBCUTANEOUS
  Administered 2014-10-23 – 2014-10-24 (×2): 7 [IU] via SUBCUTANEOUS
  Administered 2014-10-24: 4 [IU] via SUBCUTANEOUS
  Administered 2014-10-24: 3 [IU] via SUBCUTANEOUS
  Administered 2014-10-25: 7 [IU] via SUBCUTANEOUS
  Administered 2014-10-25: 4 [IU] via SUBCUTANEOUS
  Administered 2014-10-26 (×2): 3 [IU] via SUBCUTANEOUS
  Administered 2014-10-26: 11 [IU] via SUBCUTANEOUS
  Administered 2014-10-27: 4 [IU] via SUBCUTANEOUS
  Administered 2014-10-27: 3 [IU] via SUBCUTANEOUS
  Administered 2014-10-28: 11 [IU] via SUBCUTANEOUS
  Administered 2014-10-28: 3 [IU] via SUBCUTANEOUS

## 2014-10-23 MED ORDER — IPRATROPIUM-ALBUTEROL 0.5-2.5 (3) MG/3ML IN SOLN
3.0000 mL | Freq: Two times a day (BID) | RESPIRATORY_TRACT | Status: DC
  Administered 2014-10-23 – 2014-10-25 (×4): 3 mL via RESPIRATORY_TRACT
  Filled 2014-10-23 (×4): qty 3

## 2014-10-23 MED ORDER — SODIUM CHLORIDE 0.9 % IV SOLN
1000.0000 mL | INTRAVENOUS | Status: DC
  Administered 2014-10-23: 1000 mL via INTRAVENOUS

## 2014-10-23 MED ORDER — INSULIN ASPART 100 UNIT/ML ~~LOC~~ SOLN
6.0000 [IU] | Freq: Three times a day (TID) | SUBCUTANEOUS | Status: DC
  Administered 2014-10-23 – 2014-10-27 (×10): 6 [IU] via SUBCUTANEOUS

## 2014-10-23 NOTE — Progress Notes (Signed)
Rx Brief Antibiotic note:  IV Vancomycin  See 7/30 note by D Wofford for full details  Assessement:  VT=16 mg/L Goal 15-20  SCr=1.03>60 (N)  Plan:  Continue Vancomycin '500mg'$  IV q12h  F/u additional levels/SCr as needed  Dorrene German 10/23/2014 10:57 PM

## 2014-10-23 NOTE — Progress Notes (Signed)
Progress Note   JACQUE BYRON KPT:[id number] DOB: 1945-10-21 DOA: 10/21/2014 PCP: Scarlette Calico, MD   Brief Narrative:   Kelli KOOYMAN is an 69 y.o. female PMH of COPD, lung cancer (follows at [facility]) was admitted 10/21/58 with a chief complaint of weakness and anorexia. On initial evaluation, the patient was found to be hypotensive with pressures in the 80s. Lactic acid was elevated at 2.4, WBC 25, urinalysis with positive nitrates and small leukocytes, chest x-ray showing a large left upper lobe mass.  Assessment/Plan:   Principal Problem:   Sepsis secondary to UTI versus postobstructive pneumonia - Follow-up blood and urine cultures. - Continue empiric vancomycin and Zosyn pending culture data. - Continue stress dose steroids in the setting of hypotension, and wean with blood pressure stability. - Currently on empiric vancomycin and Zosyn. - WBC continues to improve.  Active Problems:   Hypokalemia - Replete.    Anemia in neoplastic disease - 3 g drop in hemoglobin over admission values, likely secondary to dilutional factors. - Monitor and transfuse as needed.    Acute encephalopathy - Likely secondary to infectious process. Mental status back to baseline.    Essential hypertension, benign - Antihypertensives currently on hold. Takes Hyzaar at baseline.    COPD GOLD II - Continue Pulmicort and broncho-dilators.    Hyperglycemia - Steroid-induced. Currently on SSI, resistant scale, 3 times a day. CBG 262-339. - Add 6 units of meal coverage Q AC and 15 units of Lantus daily.    Primary cancer of left upper lobe of lung - Follows at [facility]. Diagnosed 04/2014.  - Has undergone neo-adjuvant chemotherapy with carboplatinum and gemcitabine. - Plan was to pursue concurrent chemoradiation if the patient's condition improves. - Was a bit dyspneic with activity, we'll discontinue IV fluids and give 1 dose of Lasix today. - We'll call her oncologist on 10/25/14 as patient wants  to have her radiation treatments done here and will need coordination of care.    DVT Prophylaxis - Lovenox ordered.  Family Communication: Son updated at the bedside 10/22/14, no family present today. Disposition Plan: Home when stable and off IV antibiotics, likely another 24-48 hours if blood pressure stable and tachycardia/dyspnea improved. Code Status:     Code Status Orders        Start     Ordered   10/21/14 2213  Full code   Continuous     10/21/14 2212        IV Access:    Peripheral IV   Procedures and diagnostic studies:   Dg Chest Portable 1 View  10/21/2014   CLINICAL DATA:  Shortness of breath.  Known lung mass  EXAM: PORTABLE CHEST - 1 VIEW  COMPARISON:  Chest radiograph September 27, 2014 and chest CT September 27, 2014  FINDINGS: The large mass in the anterior segment of the left upper lobe is again noted, not appreciably changed given change in positioning and technique. Lungs elsewhere clear except for scattered calcified granulomas. Heart size and pulmonary vascularity are normal. There are calcified hilar and mediastinal lymph nodes. No adenopathy is appreciable radiographically. No bone lesions.  IMPRESSION: Stable large left upper lobe mass. Evidence of prior granulomatous disease. No edema or consolidation.   Electronically Signed   By: Lowella Grip III M.D.   On: 10/21/2014 16:26     Medical Consultants:    None.  Anti-Infectives:    Vancomycin 10/21/14--->  Zosyn 10/21/14--->  Subjective:    Jama Flavors  Objective:    Filed Vitals:   10/22/14 2027 10/22/14 2236 10/23/14 0433 10/23/14 0740  BP:  138/70 138/85   Pulse:  115 76   Temp:  97.7 F (36.5 C) 97.5 F (36.4 C)   TempSrc:  Oral Oral   Resp:  20 20   Height:      Weight:      SpO2: 99% 99% 100% 96%    Intake/Output Summary (Last 24 hours) at 10/23/14 0819 Last data filed at 10/23/14 0700  Gross per 24 hour  Intake   2650 ml  Output    300 ml  Net   2350 ml     Exam: Gen:  NAD Cardiovascular:  Tachycardic, No M/R/G Respiratory:  Lungs diminished, tachypneic Gastrointestinal:  Abdomen soft, NT/ND, + BS Extremities:  Clubbing present   Data Reviewed:    Labs: Basic Metabolic Panel:  Recent Labs Lab 10/21/14 1640 10/22/14 0401 10/23/14 0520  NA 134* 138 142  K 3.9 4.0 3.4*  CL 97* 106 113*  CO2  --  24 21*  GLUCOSE 361* 291* 342*  BUN 24* 17 17  CREATININE 1.00 0.88 1.03*  CALCIUM  --  9.5 9.1   GFR Estimated Creatinine Clearance: 46.8 mL/min (by C-G formula based on Cr of 1.03).  CBC:  Recent Labs Lab 10/21/14 1610 10/21/14 1640 10/22/14 0401 10/23/14 0520  WBC 25.1*  --  20.2* 16.7*  NEUTROABS 23.0*  --   --   --   HGB 11.9* 12.2 9.6* 8.9*  HCT 37.6 36.0 32.0* 29.2*  MCV 93.3  --  95.0 92.7  PLT 320  --  290 246   BNP (last 3 results)  Recent Labs  04/22/14 1426  PROBNP 21.0   CBG:  Recent Labs Lab 10/22/14 0806 10/22/14 1145 10/22/14 1729 10/22/14 2228 10/23/14 0722  GLUCAP 262* 369* 330* 316* 292*   Sepsis Labs:  Recent Labs Lab 10/21/14 1610 10/21/14 1641 10/22/14 0401 10/23/14 0520  WBC 25.1*  --  20.2* 16.7*  LATICACIDVEN  --  2.43*  --   --    Microbiology Recent Results (from the past 240 hour(s))  Culture, blood (routine x 2)     Status: None (Preliminary result)   Collection Time: 10/21/14  4:10 PM  Result Value Ref Range Status   Specimen Description BLOOD BLOOD LEFT ARM  Final   Special Requests BOTTLES DRAWN AEROBIC AND ANAEROBIC 5ML  Final   Culture   Final    NO GROWTH < 24 HOURS Performed at [facility]    Report Status PENDING  Incomplete  Culture, blood (routine x 2)     Status: None (Preliminary result)   Collection Time: 10/21/14  8:25 PM  Result Value Ref Range Status   Specimen Description BLOOD BLOOD RIGHT HAND  Final   Special Requests BOTTLES DRAWN AEROBIC ONLY 3ML  Final   Culture   Final    NO GROWTH < 24 HOURS Performed at [facility]    Report Status PENDING  Incomplete  Urine culture     Status: None (Preliminary result)   Collection Time: 10/21/14  8:50 PM  Result Value Ref Range Status   Specimen Description URINE, CATHETERIZED  Final   Special Requests NONE  Final   Culture   Final    NO GROWTH < 12 HOURS Performed at [facility]    Report Status PENDING  Incomplete  MRSA PCR Screening     Status: None  Collection Time: 10/21/14 10:29 PM  Result Value Ref Range Status   MRSA by PCR NEGATIVE NEGATIVE Final    Comment:        The GeneXpert MRSA Assay (FDA approved for NASAL specimens only), is one component of a comprehensive MRSA colonization surveillance program. It is not intended to diagnose MRSA infection nor to guide or monitor treatment for MRSA infections.      Medications:   . antiseptic oral rinse  7 mL Mouth Rinse BID  . budesonide (PULMICORT) nebulizer solution  0.5 mg Nebulization BID  . docusate sodium  100 mg Oral BID  . enoxaparin (LOVENOX) injection  40 mg Subcutaneous QHS  . hydrocortisone sod succinate (SOLU-CORTEF) inj  50 mg Intravenous Q12H  . insulin aspart  0-20 Units Subcutaneous TID WC  . insulin aspart  0-5 Units Subcutaneous QHS  . ipratropium-albuterol  3 mL Nebulization TID  . mirtazapine  15 mg Oral QHS  . pantoprazole  40 mg Oral Daily  . piperacillin-tazobactam (ZOSYN)  IV  3.375 g Intravenous Q8H  . vancomycin  500 mg Intravenous Q12H   Continuous Infusions: . sodium chloride 1,000 mL (10/22/14 1940)    Time spent: 25 minutes.   LOS: 2 days   RAMA,CHRISTINA  [facility] Pager [phone]. If unable to reach me by pager, please call my cell phone at [phone].  *Please refer to [email or web address], password [email or web address] to get updated schedule on who will round on this patient, as hospitalists switch teams weekly. If 7PM-7AM, please contact night-coverage at [email or web address], password [email or web address] for any overnight needs.  10/23/2014, 8:19 AM

## 2014-10-23 NOTE — Progress Notes (Signed)
ANTIBIOTIC CONSULT NOTE - FOLLOW UP  Pharmacy Consult for Vancomycin, Zosyn Indication: r/o PNA, UTI  Allergies  Allergen Reactions  . Paclitaxel Anaphylaxis    Pt with severe reaction 4 minutes into first paclitaxel infusion.   . Ibuprofen     REACTION: rash    Patient Measurements: Height: '5\' 2"'$  (157.5 cm) Weight: 142 lb 10.2 oz (64.7 kg) IBW/kg (Calculated) : 50.1  Vital Signs: Temp: 97.5 F (36.4 C) (07/30 0433) Temp Source: Oral (07/30 0433) BP: 138/85 mmHg (07/30 0433) Pulse Rate: 76 (07/30 0433) Intake/Output from previous day: 07/29 0701 - 07/30 0700 In: 2775 [P.O.:50; I.V.:2375; IV Piggyback:350] Out: 300 [Urine:300] Intake/Output from this shift: Total I/O In: 970 [P.O.:720; IV Piggyback:250] Out: 500 [Urine:500]  Labs:  Recent Labs  10/21/14 1610 10/21/14 1640 10/22/14 0401 10/23/14 0520  WBC 25.1*  --  20.2* 16.7*  HGB 11.9* 12.2 9.6* 8.9*  PLT 320  --  290 246  CREATININE  --  1.00 0.88 1.03*   Estimated Creatinine Clearance: 46.8 mL/min (by C-G formula based on Cr of 1.03). No results for input(s): VANCOTROUGH, VANCOPEAK, VANCORANDOM, GENTTROUGH, GENTPEAK, GENTRANDOM, TOBRATROUGH, TOBRAPEAK, TOBRARND, AMIKACINPEAK, AMIKACINTROU, AMIKACIN in the last 72 hours.   Microbiology: Recent Results (from the past 720 hour(s))  Urine culture     Status: None   Collection Time: 09/27/14  4:25 PM  Result Value Ref Range Status   Specimen Description URINE, CLEAN CATCH  Final   Special Requests NONE  Final   Culture   Final    MULTIPLE SPECIES PRESENT, SUGGEST RECOLLECTION IF CLINICALLY INDICATED Performed at [facility]    Report Status 09/29/2014 FINAL  Final  Blood culture (routine x 2)     Status: None   Collection Time: 09/27/14  5:55 PM  Result Value Ref Range Status   Specimen Description BLOOD RIGHT ANTECUBITAL  Final   Special Requests BOTTLES DRAWN AEROBIC AND ANAEROBIC 5CC  Final   Culture   Final    NO GROWTH 5 DAYS Performed at  [facility]    Report Status 10/02/2014 FINAL  Final  Culture, blood (routine x 2)     Status: None (Preliminary result)   Collection Time: 10/21/14  4:10 PM  Result Value Ref Range Status   Specimen Description BLOOD BLOOD LEFT ARM  Final   Special Requests BOTTLES DRAWN AEROBIC AND ANAEROBIC 5ML  Final   Culture   Final    NO GROWTH 2 DAYS Performed at [facility]    Report Status PENDING  Incomplete  Culture, blood (routine x 2)     Status: None (Preliminary result)   Collection Time: 10/21/14  8:25 PM  Result Value Ref Range Status   Specimen Description BLOOD BLOOD RIGHT HAND  Final   Special Requests BOTTLES DRAWN AEROBIC ONLY 3ML  Final   Culture   Final    NO GROWTH 2 DAYS Performed at [facility]    Report Status PENDING  Incomplete  Urine culture     Status: None   Collection Time: 10/21/14  8:50 PM  Result Value Ref Range Status   Specimen Description URINE, CATHETERIZED  Final   Special Requests NONE  Final   Culture   Final    NO GROWTH 2 DAYS Performed at [facility]    Report Status 10/23/2014 FINAL  Final  MRSA PCR Screening     Status: None   Collection Time: 10/21/14 10:29 PM  Result Value Ref Range Status  MRSA by PCR NEGATIVE NEGATIVE Final    Comment:        The GeneXpert MRSA Assay (FDA approved for NASAL specimens only), is one component of a comprehensive MRSA colonization surveillance program. It is not intended to diagnose MRSA infection nor to guide or monitor treatment for MRSA infections.     Anti-infectives    Start     Dose/Rate Route Frequency Ordered Stop   10/22/14 1000  vancomycin (VANCOCIN) 500 mg in sodium chloride 0.9 % 100 mL IVPB     500 mg 100 mL/hr over 60 Minutes Intravenous Every 12 hours 10/21/14 2213     10/22/14 0400  piperacillin-tazobactam (ZOSYN) IVPB 3.375 g     3.375 g 12.5 mL/hr over 240 Minutes Intravenous Every 8 hours 10/21/14 2213     10/21/14 2215  vancomycin (VANCOCIN)  IVPB 1000 mg/200 mL premix     1,000 mg 200 mL/hr over 60 Minutes Intravenous STAT 10/21/14 2212 10/21/14 2349   10/21/14 2215  piperacillin-tazobactam (ZOSYN) IVPB 3.375 g     3.375 g 100 mL/hr over 30 Minutes Intravenous STAT 10/21/14 2212 10/21/14 2256   10/21/14 1845  cefTRIAXone (ROCEPHIN) 1 g in dextrose 5 % 50 mL IVPB     1 g 100 mL/hr over 30 Minutes Intravenous  Once 10/21/14 1832 10/21/14 2106      Assessment: 18 yoF [facility] with weakness, poor PO intake, and concern for sepsis.  She is followed at [facility] for history of COPD, and lung cancer with most recent chemo cycle completed in June 2016.  CXR without edema or consolidation.  Concern for possible pneumonia (lung mass may obstruct view) or UTI causing sepsis.  Pharmacy is consulted to dose vancomycin and Zosyn.  Temp: afebrile WBC: elevated, trending down (on stress-dose HCT) Renal: SCr stable wnl; CrCl 47 CG LA elevated as of 7/28  7/28 >> Ceftriaxone x1 7/28 >> Vanc >> 7/28 >> Zosyn >>    7/28 blood x2: ngtd 7/28 urine (cath): ngF   Goal of Therapy:  Vancomycin trough level 15-20 mcg/ml  Eradication of infection Appropriate antibiotic dosing for indication and renal function  Plan:  Day 3 antibiotics  Continue antibiotics as ordered  Check vancomycin trough tonight before 5th dose  Follow clinical course, renal function, culture results as available  Follow for de-escalation of antibiotics and LOT   Reuel Boom, PharmD, BCPS Pager: [phone] 10/23/2014, 2:33 PM

## 2014-10-24 DIAGNOSIS — J189 Pneumonia, unspecified organism: Secondary | ICD-10-CM | POA: Diagnosis present

## 2014-10-24 DIAGNOSIS — R0789 Other chest pain: Secondary | ICD-10-CM | POA: Diagnosis not present

## 2014-10-24 LAB — GLUCOSE, CAPILLARY
Glucose-Capillary: 248 mg/dL — ABNORMAL HIGH (ref 65–99)
Glucose-Capillary: 134 mg/dL — ABNORMAL HIGH (ref 65–99)
Glucose-Capillary: 171 mg/dL — ABNORMAL HIGH (ref 65–99)
Glucose-Capillary: 216 mg/dL — ABNORMAL HIGH (ref 65–99)

## 2014-10-24 LAB — CBC
HCT: 30.4 % — ABNORMAL LOW (ref 36.0–46.0)
HEMOGLOBIN: 9.4 g/dL — AB (ref 12.0–15.0)
MCH: 28.2 pg (ref 26.0–34.0)
MCHC: 30.9 g/dL (ref 30.0–36.0)
MCV: 91.3 fL (ref 78.0–100.0)
PLATELETS: 286 10*3/uL (ref 150–400)
RBC: 3.33 MIL/uL — AB (ref 3.87–5.11)
RDW: 15.1 % (ref 11.5–15.5)
WBC: 15.3 10*3/uL — AB (ref 4.0–10.5)

## 2014-10-24 LAB — TROPONIN I: Troponin I: <0.03 ng/mL (ref <0.031)

## 2014-10-24 MED ORDER — LOSARTAN POTASSIUM 50 MG PO TABS
50.0000 mg | ORAL_TABLET | Freq: Every day | ORAL | Status: DC
  Administered 2014-10-24: 50 mg via ORAL
  Filled 2014-10-24 (×2): qty 1

## 2014-10-24 MED ORDER — PREDNISONE 10 MG PO TABS
10.0000 mg | ORAL_TABLET | Freq: Every day | ORAL | Status: DC
  Administered 2014-10-24 – 2014-10-28 (×5): 10 mg via ORAL
  Filled 2014-10-24 (×5): qty 1

## 2014-10-24 MED ORDER — HYDROCORTISONE NA SUCCINATE PF 100 MG IJ SOLR: 50.0000 mg | Freq: Every day | INTRAMUSCULAR | Status: DC

## 2014-10-24 MED ORDER — LOSARTAN POTASSIUM-HCTZ 50-12.5 MG PO TABS: 1.0000 | ORAL_TABLET | Freq: Every morning | ORAL | Status: DC

## 2014-10-24 MED ORDER — HYDROCHLOROTHIAZIDE 12.5 MG PO CAPS
12.5000 mg | ORAL_CAPSULE | Freq: Every day | ORAL | Status: DC
  Administered 2014-10-24: 12.5 mg via ORAL
  Filled 2014-10-24 (×2): qty 1

## 2014-10-24 NOTE — Progress Notes (Signed)
Progress Note   Kelli Reyes ZOX:[id number] DOB: 07-30-1945 DOA: 10/21/2014 PCP: Scarlette Calico, MD  Oncologist: Dr. Varney Biles (sees his NP Tommi Rumps): [phone] / [phone]   Brief Narrative:   Kelli Reyes is an 69 y.o. female PMH of COPD, lung cancer (follows at [facility]) was admitted 10/21/58 with a chief complaint of weakness and anorexia. On initial evaluation, the patient was found to be hypotensive with pressures in the 80s. Lactic acid was elevated at 2.4, WBC 25, urinalysis with positive nitrates and small leukocytes, chest x-ray showing a large left upper lobe mass. Patient is currently being treated for sepsis with broad-spectrum antibiotics pending culture data.  Assessment/Plan:   Principal Problem:   Sepsis secondary to probable postobstructive pneumonia - Blood cultures negative to date, urine culture negative. - Continue empiric vancomycin and Zosyn pending culture data. - Blood pressure stable, weaned stress dose steroids, resume maintenance dose of steroids. - Currently on empiric vancomycin and Zosyn. - WBC continues to improve.  Active Problems:   Chest heaviness - Check 12 lead EKG, cycle troponins.    Hypokalemia - Repleted. Recheck potassium in the morning.    Anemia in neoplastic disease - 3 g drop in hemoglobin over admission values, likely secondary to dilutional factors. - Monitor and transfuse as needed.    Acute encephalopathy - Likely secondary to infectious process. Mental status back to baseline.    Essential hypertension, benign - Antihypertensives currently on hold. Takes Hyzaar at baseline. Will resume.    COPD GOLD II - Continue Pulmicort and broncho-dilators.    Hyperglycemia - Steroid-induced. Currently on SSI, resistant scale, 3 times a day, 6 units of meal coverage and 15 units of Lantus daily. - CBG [id number]. Glycemic control should improve with reduction in steroids dose.    Primary cancer of left upper lobe of  lung - Follows at [facility]. Diagnosed 04/2014.  - Has undergone neo-adjuvant chemotherapy with carboplatinum and gemcitabine. - Please contact [facility] 10/25/14 to discuss treatment planning with her oncologist. Contact listed above. - Plan was to pursue concurrent chemoradiation. - Patient wants to have her radiation treatment done locally, please call radiation oncology in a.m.    DVT Prophylaxis - Lovenox ordered.  Family Communication: Son updated at the bedside 10/22/14, no family present today. Disposition Plan: Home when stable and off IV antibiotics, likely another 24-48 hours if blood pressure stable and tachycardia/dyspnea improved and cultures negative. Code Status:     Code Status Orders        Start     Ordered   10/21/14 2213  Full code   Continuous     10/21/14 2212        IV Access:    Peripheral IV   Procedures and diagnostic studies:   No results found.   Medical Consultants:    None.  Anti-Infectives:    Vancomycin 10/21/14--->  Zosyn 10/21/14--->  Subjective:   Kelli Reyes is still getting very short of breath with activity.  Has a dry but congested cough.  Has had a headache, but otherwise no other pains.  Last BM was this morning.  No N/V. Reports some heaviness in her chest.   Objective:    Filed Vitals:   10/23/14 1433 10/23/14 2155 10/24/14 0532 10/24/14 0933  BP: 126/84 157/92 160/102   Pulse: 88 113 97   Temp: 97.5 F (36.4 C) 98.1 F (36.7 C) 97.8 F (36.6 C)   TempSrc: Oral Oral Oral  Resp: '20 22 24   '$ Height:      Weight:      SpO2: 97% 100% 100% 98%    Intake/Output Summary (Last 24 hours) at 10/24/14 1102 Last data filed at 10/24/14 0340  Gross per 24 hour  Intake   1196 ml  Output   1700 ml  Net   -504 ml    Exam: Gen:  NAD Cardiovascular:  Tachycardic, No M/R/G Respiratory:  Lungs diminished, occasional rhonchi Gastrointestinal:  Abdomen soft, NT/ND, + BS Extremities:  Clubbing present   Data Reviewed:     Labs: Basic Metabolic Panel:  Recent Labs Lab 10/21/14 1640 10/22/14 0401 10/23/14 0520  NA 134* 138 142  K 3.9 4.0 3.4*  CL 97* 106 113*  CO2  --  24 21*  GLUCOSE 361* 291* 342*  BUN 24* 17 17  CREATININE 1.00 0.88 1.03*  CALCIUM  --  9.5 9.1   GFR Estimated Creatinine Clearance: 46.8 mL/min (by C-G formula based on Cr of 1.03).  CBC:  Recent Labs Lab 10/21/14 1610 10/21/14 1640 10/22/14 0401 10/23/14 0520 10/24/14 0719  WBC 25.1*  --  20.2* 16.7* 15.3*  NEUTROABS 23.0*  --   --   --   --   HGB 11.9* 12.2 9.6* 8.9* 9.4*  HCT 37.6 36.0 32.0* 29.2* 30.4*  MCV 93.3  --  95.0 92.7 91.3  PLT 320  --  290 246 286   BNP (last 3 results)  Recent Labs  04/22/14 1426  PROBNP 21.0   CBG:  Recent Labs Lab 10/23/14 0722 10/23/14 1219 10/23/14 1711 10/23/14 2153 10/24/14 0708  GLUCAP 292* 236* 280* 169* 248*   Sepsis Labs:  Recent Labs Lab 10/21/14 1610 10/21/14 1641 10/22/14 0401 10/23/14 0520 10/24/14 0719  WBC 25.1*  --  20.2* 16.7* 15.3*  LATICACIDVEN  --  2.43*  --   --   --    Microbiology Recent Results (from the past 240 hour(s))  Culture, blood (routine x 2)     Status: None (Preliminary result)   Collection Time: 10/21/14  4:10 PM  Result Value Ref Range Status   Specimen Description BLOOD BLOOD LEFT ARM  Final   Special Requests BOTTLES DRAWN AEROBIC AND ANAEROBIC 5ML  Final   Culture   Final    NO GROWTH 2 DAYS Performed at [facility]    Report Status PENDING  Incomplete  Culture, blood (routine x 2)     Status: None (Preliminary result)   Collection Time: 10/21/14  8:25 PM  Result Value Ref Range Status   Specimen Description BLOOD BLOOD RIGHT HAND  Final   Special Requests BOTTLES DRAWN AEROBIC ONLY 3ML  Final   Culture   Final    NO GROWTH 2 DAYS Performed at [facility]    Report Status PENDING  Incomplete  Urine culture     Status: None   Collection Time: 10/21/14  8:50 PM  Result Value Ref Range  Status   Specimen Description URINE, CATHETERIZED  Final   Special Requests NONE  Final   Culture   Final    NO GROWTH 2 DAYS Performed at [facility]    Report Status 10/23/2014 FINAL  Final  MRSA PCR Screening     Status: None   Collection Time: 10/21/14 10:29 PM  Result Value Ref Range Status   MRSA by PCR NEGATIVE NEGATIVE Final    Comment:        The GeneXpert MRSA Assay (  FDA approved for NASAL specimens only), is one component of a comprehensive MRSA colonization surveillance program. It is not intended to diagnose MRSA infection nor to guide or monitor treatment for MRSA infections.      Medications:   . antiseptic oral rinse  7 mL Mouth Rinse BID  . budesonide (PULMICORT) nebulizer solution  0.5 mg Nebulization BID  . docusate sodium  100 mg Oral BID  . enoxaparin (LOVENOX) injection  40 mg Subcutaneous QHS  . losartan  50 mg Oral Daily   And  . hydrochlorothiazide  12.5 mg Oral Daily  . insulin aspart  0-20 Units Subcutaneous TID WC  . insulin aspart  0-5 Units Subcutaneous QHS  . insulin aspart  6 Units Subcutaneous TID WC  . insulin glargine  15 Units Subcutaneous QHS  . ipratropium-albuterol  3 mL Nebulization BID  . mirtazapine  15 mg Oral QHS  . pantoprazole  40 mg Oral Daily  . piperacillin-tazobactam (ZOSYN)  IV  3.375 g Intravenous Q8H  . predniSONE  10 mg Oral Q breakfast  . vancomycin  500 mg Intravenous Q12H   Continuous Infusions: . sodium chloride 1,000 mL (10/23/14 1224)    Time spent: 25 minutes.   LOS: 3 days   [facility] Pager [phone]. If unable to reach me by pager, please call my cell phone at [phone].  *Please refer to [email or web address], password [email or web address] to get updated schedule on who will round on this patient, as hospitalists switch teams weekly. If 7PM-7AM, please contact night-coverage at [email or web address], password [email or web address] for any overnight needs.  10/24/2014, 11:02 AM

## 2014-10-25 ENCOUNTER — Other Ambulatory Visit: Payer: Self-pay | Admitting: Hematology and Oncology

## 2014-10-25 ENCOUNTER — Inpatient Hospital Stay (HOSPITAL_COMMUNITY): Payer: PPO

## 2014-10-25 DIAGNOSIS — E876 Hypokalemia: Secondary | ICD-10-CM

## 2014-10-25 DIAGNOSIS — J189 Pneumonia, unspecified organism: Secondary | ICD-10-CM

## 2014-10-25 DIAGNOSIS — E860 Dehydration: Secondary | ICD-10-CM

## 2014-10-25 DIAGNOSIS — J449 Chronic obstructive pulmonary disease, unspecified: Secondary | ICD-10-CM

## 2014-10-25 DIAGNOSIS — C3412 Malignant neoplasm of upper lobe, left bronchus or lung: Secondary | ICD-10-CM

## 2014-10-25 DIAGNOSIS — R531 Weakness: Secondary | ICD-10-CM

## 2014-10-25 DIAGNOSIS — D638 Anemia in other chronic diseases classified elsewhere: Secondary | ICD-10-CM

## 2014-10-25 DIAGNOSIS — D72829 Elevated white blood cell count, unspecified: Secondary | ICD-10-CM

## 2014-10-25 DIAGNOSIS — A419 Sepsis, unspecified organism: Principal | ICD-10-CM

## 2014-10-25 DIAGNOSIS — I10 Essential (primary) hypertension: Secondary | ICD-10-CM

## 2014-10-25 DIAGNOSIS — R739 Hyperglycemia, unspecified: Secondary | ICD-10-CM

## 2014-10-25 LAB — CBC
HCT: 31.2 % — ABNORMAL LOW (ref 36.0–46.0)
Hemoglobin: 9.7 g/dL — ABNORMAL LOW (ref 12.0–15.0)
MCH: 28.3 pg (ref 26.0–34.0)
MCHC: 31.1 g/dL (ref 30.0–36.0)
MCV: 91 fL (ref 78.0–100.0)
PLATELETS: 297 10*3/uL (ref 150–400)
RBC: 3.43 MIL/uL — AB (ref 3.87–5.11)
RDW: 15.2 % (ref 11.5–15.5)
WBC: 15.8 10*3/uL — AB (ref 4.0–10.5)

## 2014-10-25 LAB — BASIC METABOLIC PANEL
Anion gap: 8 (ref 5–15)
BUN: 13 mg/dL (ref 6–20)
CO2: 24 mmol/L (ref 22–32)
CREATININE: 1 mg/dL (ref 0.44–1.00)
Calcium: 8.9 mg/dL (ref 8.9–10.3)
Chloride: 107 mmol/L (ref 101–111)
GFR calc non Af Amer: 57 mL/min — ABNORMAL LOW (ref >60)
GLUCOSE: 206 mg/dL — AB (ref 65–99)
POTASSIUM: 2.5 mmol/L — AB (ref 3.5–5.1)
Sodium: 139 mmol/L (ref 135–145)

## 2014-10-25 LAB — TROPONIN I: Troponin I: <0.03 ng/mL (ref <0.031)

## 2014-10-25 LAB — GLUCOSE, CAPILLARY
GLUCOSE-CAPILLARY: 202 mg/dL — AB (ref 65–99)
Glucose-Capillary: 115 mg/dL — ABNORMAL HIGH (ref 65–99)
Glucose-Capillary: 155 mg/dL — ABNORMAL HIGH (ref 65–99)
Glucose-Capillary: 211 mg/dL — ABNORMAL HIGH (ref 65–99)

## 2014-10-25 LAB — MAGNESIUM: Magnesium: 1.1 mg/dL — ABNORMAL LOW (ref 1.7–2.4)

## 2014-10-25 MED ORDER — POTASSIUM CHLORIDE CRYS ER 20 MEQ PO TBCR
40.0000 meq | EXTENDED_RELEASE_TABLET | ORAL | Status: AC
  Administered 2014-10-25 (×2): 40 meq via ORAL
  Filled 2014-10-25 (×2): qty 2

## 2014-10-25 MED ORDER — LOSARTAN POTASSIUM 25 MG PO TABS
25.0000 mg | ORAL_TABLET | Freq: Every day | ORAL | Status: DC
  Administered 2014-10-25 – 2014-10-28 (×4): 25 mg via ORAL
  Filled 2014-10-25 (×4): qty 1

## 2014-10-25 MED ORDER — MAGNESIUM SULFATE 2 GM/50ML IV SOLN
2.0000 g | Freq: Once | INTRAVENOUS | Status: AC
  Administered 2014-10-25: 2 g via INTRAVENOUS
  Filled 2014-10-25: qty 50

## 2014-10-25 MED ORDER — LEVALBUTEROL HCL 1.25 MG/0.5ML IN NEBU
1.2500 mg | INHALATION_SOLUTION | Freq: Three times a day (TID) | RESPIRATORY_TRACT | Status: DC
  Administered 2014-10-25 – 2014-10-28 (×8): 1.25 mg via RESPIRATORY_TRACT
  Filled 2014-10-25 (×11): qty 0.5

## 2014-10-25 MED ORDER — METOPROLOL TARTRATE 25 MG PO TABS
12.5000 mg | ORAL_TABLET | Freq: Two times a day (BID) | ORAL | Status: DC
  Administered 2014-10-25 – 2014-10-28 (×7): 12.5 mg via ORAL
  Filled 2014-10-25 (×7): qty 1

## 2014-10-25 MED ORDER — POTASSIUM CHLORIDE 10 MEQ/100ML IV SOLN
10.0000 meq | INTRAVENOUS | Status: AC
  Administered 2014-10-25 (×4): 10 meq via INTRAVENOUS
  Filled 2014-10-25 (×3): qty 100

## 2014-10-25 MED ORDER — GUAIFENESIN ER 600 MG PO TB12
600.0000 mg | ORAL_TABLET | Freq: Two times a day (BID) | ORAL | Status: DC
  Administered 2014-10-25 – 2014-10-28 (×6): 600 mg via ORAL
  Filled 2014-10-25 (×8): qty 1

## 2014-10-25 MED ORDER — IPRATROPIUM BROMIDE 0.02 % IN SOLN
0.5000 mg | Freq: Three times a day (TID) | RESPIRATORY_TRACT | Status: DC
  Administered 2014-10-25 – 2014-10-28 (×9): 0.5 mg via RESPIRATORY_TRACT
  Filled 2014-10-25 (×9): qty 2.5

## 2014-10-25 NOTE — Care Management Note (Signed)
Case Management Note  Patient Details  Name: Kelli Reyes MRN: [id number] Date of Birth: November 03, 1945  Subjective/Objective:   Await PT/OT recommendations.                 Action/Plan:d/c plan home.   Expected Discharge Date:   (unknown)               Expected Discharge Plan:  Home/Self Care  In-House Referral:  NA  Discharge planning Services  CM Consult  Post Acute Care Choice:  NA Choice offered to:  NA  DME Arranged:  N/A DME Agency:  NA  HH Arranged:  NA HH Agency:  NA  Status of Service:  In process, will continue to follow  Medicare Important Message Given:  Yes-second notification given Date Medicare IM Given:    Medicare IM give by:    Date Additional Medicare IM Given:    Additional Medicare Important Message give by:     If discussed at [place] of Stay Meetings, dates discussed:    Additional Comments:  Dessa Phi, RN 10/25/2014, 3:31 PM

## 2014-10-25 NOTE — Progress Notes (Signed)
Resting oxygen sat was 96%.  Pt stayed at 96% while ambulating in the hall for 200 feet on room air.  EKG showed HR up to 145 during ambulation.  No c/o SOB

## 2014-10-25 NOTE — Progress Notes (Addendum)
Progress Note   Kelli Reyes CBU:[id number] DOB: 05-Aug-1945 DOA: 10/21/2014 PCP: Scarlette Calico, MD  Oncologist: Dr. Varney Biles (sees his NP Tommi Rumps): [phone] / [phone]   Brief Narrative:   Kelli Reyes is an 69 y.o. female PMH of COPD, lung cancer (follows at [facility]) was admitted 10/21/58 with a chief complaint of weakness and anorexia. On initial evaluation, the patient was found to be hypotensive with pressures in the 80s. Lactic acid was elevated at 2.4, WBC 25, urinalysis with positive nitrates and small leukocytes, chest x-ray showing a large left upper lobe mass. Patient is currently being treated for sepsis with broad-spectrum antibiotics pending culture data.  Assessment/Plan:   Principal Problem:   Sepsis secondary to probable postobstructive pneumonia - Blood cultures negative to date, urine culture negative. - recieved empiric vancomycin and Zosyn, culture no growth, mrsa screening negative, will d/c  Vanc, continue zosyn - Blood pressure stable, weaned stress dose steroids, resume maintenance dose of steroids. -  WBC improving, 25 on admission, now 15.8 -add mucinex, repeat cxr,ambulate check pulse oxygen ( addendum: ambulated oxygen saturation above 95% but sinus tachycardia into 140's, will change duoneb to xopenex/atrovent, decreased dose of losartan, add low dose lopressor, tsh wnl)  Active Problems:   Chest heaviness - 12 lead EKG sinus tachycardia, occasional pvc's, negative troponins. Repeat cxr    Hypokalemia/hypomagnesemia - replacek/mag, held HCTZ    Anemia in neoplastic disease - 3 g drop in hemoglobin over admission values, likely secondary to dilutional factors. - Monitor and transfuse as needed.    Acute encephalopathy - Likely secondary to infectious process. Mental status back to baseline.    Essential hypertension, benign - Antihypertensives currently on hold. Takes Hyzaar at baseline. Will resume.    COPD GOLD II -  Continue Pulmicort and broncho-dilators.    Hyperglycemia - Steroid-induced. Currently on SSI, resistant scale, 3 times a day, 6 units of meal coverage and 15 units of Lantus daily. - CBG [id number]. Glycemic control should improve with reduction in steroids dose.    Primary cancer of left upper lobe of lung - Follows at [facility]. Diagnosed 04/2014.  - Has undergone neo-adjuvant chemotherapy with carboplatinum and gemcitabine. -Plan was to pursue concurrent chemoradiation, Patient wants to have her  treatment done locally, oncology consulted, Dr. Julien Nordmann will see patient today    DVT Prophylaxis - Lovenox ordered.  Family Communication: Son updated at the bedside 10/22/14, son updated over the phone on 8/1 at [phone]. Disposition Plan: Home when stable and off IV antibiotics, likely another 24-48 hours if blood pressure stable and tachycardia/dyspnea improved and cultures negative. Code Status:     Code Status Orders        Start     Ordered   10/21/14 2213  Full code   Continuous     10/21/14 2212        IV Access:    Peripheral IV   Procedures and diagnostic studies:   No results found.   Medical Consultants:    None.  Anti-Infectives:    Vancomycin 10/21/14--->8/1  Zosyn 10/21/14--->  Subjective:   Kelli Reyes reported headache for the last three days, denies photophobic, no neck pain, no n/v, reported dry cough, no appetite, denies heaviness in her chest today.   Objective:    Filed Vitals:   10/24/14 2007 10/24/14 2034 10/25/14 0516 10/25/14 0756  BP:  131/70 139/73   Pulse:  110 116 110  Temp:  98.4  F (36.9 C) 99.2 F (37.3 C)   TempSrc:  Oral Oral   Resp:  '14 16 18  '$ Height:      Weight:      SpO2: 98% 98% 100% 98%    Intake/Output Summary (Last 24 hours) at 10/25/14 0109 Last data filed at 10/25/14 0645  Gross per 24 hour  Intake 946.83 ml  Output      0 ml  Net 946.83 ml    Exam: Gen:  NAD Cardiovascular:  Tachycardic, No  M/R/G Respiratory:  Lungs diminished, occasional rhonchi, crackles at bases Gastrointestinal:  Abdomen soft, NT/ND, + BS Extremities:  Clubbing present, no edema   Data Reviewed:    Labs: Basic Metabolic Panel:  Recent Labs Lab 10/21/14 1640 10/22/14 0401 10/23/14 0520 10/25/14 0140  NA 134* 138 142 139  K 3.9 4.0 3.4* 2.5*  CL 97* 106 113* 107  CO2  --  24 21* 24  GLUCOSE 361* 291* 342* 206*  BUN 24* '17 17 13  '$ CREATININE 1.00 0.88 1.03* 1.00  CALCIUM  --  9.5 9.1 8.9  MG  --   --   --  1.1*   GFR Estimated Creatinine Clearance: 48.2 mL/min (by C-G formula based on Cr of 1).  CBC:  Recent Labs Lab 10/21/14 1610 10/21/14 1640 10/22/14 0401 10/23/14 0520 10/24/14 0719 10/25/14 0140  WBC 25.1*  --  20.2* 16.7* 15.3* 15.8*  NEUTROABS 23.0*  --   --   --   --   --   HGB 11.9* 12.2 9.6* 8.9* 9.4* 9.7*  HCT 37.6 36.0 32.0* 29.2* 30.4* 31.2*  MCV 93.3  --  95.0 92.7 91.3 91.0  PLT 320  --  290 246 286 297   BNP (last 3 results)  Recent Labs  04/22/14 1426  PROBNP 21.0   CBG:  Recent Labs Lab 10/24/14 0708 10/24/14 1220 10/24/14 1717 10/24/14 2128 10/25/14 0716  GLUCAP 248* 134* 171* 216* 115*   Sepsis Labs:  Recent Labs Lab 10/21/14 1641 10/22/14 0401 10/23/14 0520 10/24/14 0719 10/25/14 0140  WBC  --  20.2* 16.7* 15.3* 15.8*  LATICACIDVEN 2.43*  --   --   --   --    Microbiology Recent Results (from the past 240 hour(s))  Culture, blood (routine x 2)     Status: None (Preliminary result)   Collection Time: 10/21/14  4:10 PM  Result Value Ref Range Status   Specimen Description BLOOD BLOOD LEFT ARM  Final   Special Requests BOTTLES DRAWN AEROBIC AND ANAEROBIC 5ML  Final   Culture   Final    NO GROWTH 3 DAYS Performed at [facility]    Report Status PENDING  Incomplete  Culture, blood (routine x 2)     Status: None (Preliminary result)   Collection Time: 10/21/14  8:25 PM  Result Value Ref Range Status   Specimen Description  BLOOD BLOOD RIGHT HAND  Final   Special Requests BOTTLES DRAWN AEROBIC ONLY 3ML  Final   Culture   Final    NO GROWTH 3 DAYS Performed at [facility]    Report Status PENDING  Incomplete  Urine culture     Status: None   Collection Time: 10/21/14  8:50 PM  Result Value Ref Range Status   Specimen Description URINE, CATHETERIZED  Final   Special Requests NONE  Final   Culture   Final    NO GROWTH 2 DAYS Performed at [facility]    Report  Status 10/23/2014 FINAL  Final  MRSA PCR Screening     Status: None   Collection Time: 10/21/14 10:29 PM  Result Value Ref Range Status   MRSA by PCR NEGATIVE NEGATIVE Final    Comment:        The GeneXpert MRSA Assay (FDA approved for NASAL specimens only), is one component of a comprehensive MRSA colonization surveillance program. It is not intended to diagnose MRSA infection nor to guide or monitor treatment for MRSA infections.      Medications:   . antiseptic oral rinse  7 mL Mouth Rinse BID  . budesonide (PULMICORT) nebulizer solution  0.5 mg Nebulization BID  . docusate sodium  100 mg Oral BID  . enoxaparin (LOVENOX) injection  40 mg Subcutaneous QHS  . guaiFENesin  600 mg Oral BID  . insulin aspart  0-20 Units Subcutaneous TID WC  . insulin aspart  0-5 Units Subcutaneous QHS  . insulin aspart  6 Units Subcutaneous TID WC  . insulin glargine  15 Units Subcutaneous QHS  . ipratropium-albuterol  3 mL Nebulization BID  . losartan  50 mg Oral Daily  . magnesium sulfate 1 - 4 g bolus IVPB  2 g Intravenous Once  . mirtazapine  15 mg Oral QHS  . pantoprazole  40 mg Oral Daily  . piperacillin-tazobactam (ZOSYN)  IV  3.375 g Intravenous Q8H  . potassium chloride  40 mEq Oral Q4H  . predniSONE  10 mg Oral Q breakfast   Continuous Infusions: . sodium chloride 1,000 mL (10/23/14 1224)    Time spent: 35 minutes.   LOS: 4 days   Fain Francis  [facility] Pager [phone].  *Please refer to [email or web address], password  [email or web address] to get updated schedule on who will round on this patient, as hospitalists switch teams weekly. If 7PM-7AM, please contact night-coverage at [email or web address], password [email or web address] for any overnight needs.  10/25/2014, 9:22 AM

## 2014-10-25 NOTE — Evaluation (Signed)
Physical Therapy Evaluation Patient Details Name: AMANDAMARIE FEGGINS MRN: [id number] DOB: Sep 23, 1945 Today's Date: 10/25/2014   History of Present Illness  69 yo female admitted with sepsis, weakness. Hx of COPD, lung cancer, HTN, OA. Pt is from home alone.   Clinical Impression  Short session due to oncologist arrival. On eval, pt was Min assist for mobility-walked ~75 feet. Unsteady with intermittent stumbling. Recommend HHPT, depending on progress-may not need.         Follow Up Recommendations Home health PT (depending on progress)    Equipment Recommendations  None recommended by PT    Recommendations for Other Services       Precautions / Restrictions Precautions Precautions: Fall      Mobility  Bed Mobility               General bed mobility comments: pt oob in recliner  Transfers Overall transfer level: Needs assistance   Transfers: Sit to/from Stand Sit to Stand: Min assist         General transfer comment: assist to stabilize.   Ambulation/Gait Ambulation/Gait assistance: Min assist Ambulation Distance (Feet): 75 Feet Assistive device: None Gait Pattern/deviations: Wide base of support;Decreased stride length;Step-through pattern     General Gait Details: assist to stabilize.   Stairs            Wheelchair Mobility    Modified Rankin (Stroke Patients Only)       Balance Overall balance assessment: Needs assistance         Standing balance support: During functional activity Standing balance-Leahy Scale: Fair                               Pertinent Vitals/Pain Pain Assessment: Faces Faces Pain Scale: Hurts even more Pain Location: head Pain Descriptors / Indicators: Aching Pain Intervention(s): Monitored during session    Home Living Family/patient expects to be discharged to:: Private residence Living Arrangements: Alone   Type of Home: House Home Access: Stairs to enter   Technical brewer of  Steps: 4 Home Layout: One level Home Equipment: None      Prior Function Level of Independence: Independent               Hand Dominance        Extremity/Trunk Assessment   Upper Extremity Assessment: Defer to OT evaluation           Lower Extremity Assessment: Generalized weakness      Cervical / Trunk Assessment: Normal  Communication   Communication: No difficulties  Cognition Arousal/Alertness: Awake/alert Behavior During Therapy: WFL for tasks assessed/performed Overall Cognitive Status: Within Functional Limits for tasks assessed                      General Comments      Exercises        Assessment/Plan    PT Assessment Patient needs continued PT services  PT Diagnosis Difficulty walking;Generalized weakness;Acute pain   PT Problem List Decreased mobility;Decreased balance;Pain;Decreased strength  PT Treatment Interventions Gait training;Functional mobility training;Therapeutic activities;Patient/family education;Therapeutic exercise;Balance training   PT Goals (Current goals can be found in the Care Plan section) Acute Rehab PT Goals Patient Stated Goal: none stated PT Goal Formulation: With patient Time For Goal Achievement: 11/08/14 Potential to Achieve Goals: Good    Frequency Min 3X/week   Barriers to discharge        Co-evaluation  End of Session Equipment Utilized During Treatment: Gait belt Activity Tolerance: Patient tolerated treatment well Patient left: in chair;with call bell/phone within reach           Time: 7096-2836 PT Time Calculation (min) (ACUTE ONLY): 8 min   Charges:   PT Evaluation $Initial PT Evaluation Tier I: 1 Procedure     PT G Codes:        Weston Anna, MPT Pager: [phone]

## 2014-10-25 NOTE — Progress Notes (Signed)
CRITICAL VALUE ALERT  Critical value received:  K+ 2.5  Date of notification:  10/25/2014  Time of notification:  02:36  Critical value read back:Yes.    Nurse who received alert:  F. Milagros Loll, RN  MD notified (1st page):  Fredirick Maudlin  Time of first page:  02:45  MD notified (2nd page):  Time of second page:  Responding MD:  Fredirick Maudlin  Time MD responded:  02:47

## 2014-10-25 NOTE — Consult Note (Signed)
[place] NOTE  Patient Care Team: Janith Lima, MD as PCP - General  CHIEF COMPLAINTS/PURPOSE OF CONSULTATION:  Lung cancer  HISTORY OF PRESENTING ILLNESS:  Kelli Reyes 69 y.o. female is because of diagnosis of lung cancer and requested treatment closer to home. I last saw the patient if every 2016. The patient subsequently went to [facility] for second opinion and receive treatment there. I review her records and summarize her oncologic history is as follows:   Primary cancer of left upper lobe of lung   12/09/2013 Imaging CT scan of the scan showed abnormal soft tissue mass in the left upper lobe extending to the left hilum which is worrisome for primary pulmonarymalignancy, with stable findings consistent with previous granulomatous inflammation.   12/24/2013 Procedure She underwent bronchoscopy which showed no endoscopic lesion. Bronchoalveolar lavage was nondiagnostic.   01/28/2014 Imaging PET/CT scan showedLarge left upper lobe paramediastinal intensely hypermetabolic mass coupled with hypermetabolic spleen and symmetric hypermetabolichilar adenopathy. This is in a typical pattern for bronchogeniccarcinoma. 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   04/15/2014 Procedure Repeat bronchoscopy showed  Airways normal except for Mild cobblestoning bilaterally, conc narrowing AP segment LUL      07/02/2014 - 08/27/2014 Chemotherapy She received 3 cycles of chemotherapy [facility] combination gemcitabine and carboplatin. Original plan would be to give her carboplatin and Taxol but Taxol was discontinued due to severe allergic reaction within several minutes of treatment   07/05/2014 Imaging Necrotic appearing approximately 64 mm mass in the left upper lobe with broad mediastinal contact   09/22/2014 Imaging Repeat CT scan of the chest at [place] show improved disease  control   09/27/2014 Imaging CT chest showed central left upper lobe lung mass measures 5.3 x 4.3 cm versus 6.4 x 5.7 cm    Between April to July 2016, she had recurrent ER visits ecause of weakness and syncopal episode. The patient was admitted to the hospital since 10/13/2014 because of weakness and dehydration. She received IV fluid resuscitation, IV anti-biotics and supportive care and is improving. She denies recent cough or chest pain.  MEDICAL HISTORY:  Past Medical History  Diagnosis Date  . COPD (chronic obstructive pulmonary disease)   . GERD (gastroesophageal reflux disease)   . Hypertension   . Osteoarthritis   . Lung nodules     SURGICAL HISTORY: Past Surgical History  Procedure Laterality Date  . Abdominal hysterectomy    . Video bronchoscopy Bilateral 12/24/2013    Procedure: VIDEO BRONCHOSCOPY WITH FLUORO;  Surgeon: Tanda Rockers, MD;  Location: [facility];  Service: Cardiopulmonary;  Laterality: Bilateral;  . Video bronchoscopy Bilateral 04/15/2014    Procedure: VIDEO BRONCHOSCOPY WITH FLUORO;  Surgeon: Tanda Rockers, MD;  Location: [facility];  Service: Cardiopulmonary;  Laterality: Bilateral;    SOCIAL HISTORY: History   Social History  . Marital Status: Widowed    Spouse Name: N/A  . Number of Children: 4  . Years of Education: N/A   Occupational History  . school bus driver     [place] History Main Topics  . Smoking status: Former Smoker -- 2.00 packs/day for 25 years    Types: Cigarettes    Quit date: 09/03/1996  . Smokeless tobacco: Never Used  . Alcohol Use: No     Comment: beer 2-3 times a week  . Drug Use: No  . Sexual Activity:  Not Currently   Other Topics Concern  . Not on file   Social History Narrative   No regular exercise          FAMILY HISTORY: Family History  Problem Relation Age of Onset  . Hyperlipidemia Other   . Cancer Neg Hx   . Stroke Neg Hx   . Heart disease Neg Hx     ALLERGIES:  is  allergic to paclitaxel and ibuprofen.  MEDICATIONS:  Current Facility-Administered Medications  Medication Dose Route Frequency Provider Last Rate Last Dose  . 0.9 %  sodium chloride infusion  1,000 mL Intravenous Continuous Venetia Maxon Rama, MD 10 mL/hr at 10/23/14 1224 1,000 mL at 10/23/14 1224  . acetaminophen (TYLENOL) tablet 650 mg  650 mg Oral Q6H PRN Belkys A Regalado, MD   650 mg at 10/25/14 1539  . albuterol (PROVENTIL) (2.5 MG/3ML) 0.083% nebulizer solution 3 mL  3 mL Inhalation Q6H PRN Belkys A Regalado, MD      . antiseptic oral rinse (CPC / CETYLPYRIDINIUM CHLORIDE 0.05%) solution 7 mL  7 mL Mouth Rinse BID Belkys A Regalado, MD   7 mL at 10/25/14 1000  . budesonide (PULMICORT) nebulizer solution 0.5 mg  0.5 mg Nebulization BID Belkys A Regalado, MD   0.5 mg at 10/25/14 0756  . docusate sodium (COLACE) capsule 100 mg  100 mg Oral BID Belkys A Regalado, MD   100 mg at 10/25/14 0948  . enoxaparin (LOVENOX) injection 40 mg  40 mg Subcutaneous QHS Belkys A Regalado, MD   40 mg at 10/24/14 2225  . guaiFENesin (MUCINEX) 12 hr tablet 600 mg  600 mg Oral BID Florencia Reasons, MD   600 mg at 10/25/14 1042  . HYDROcodone-acetaminophen (NORCO/VICODIN) 5-325 MG per tablet 1-2 tablet  1-2 tablet Oral Q4H PRN Belkys A Regalado, MD      . insulin aspart (novoLOG) injection 0-20 Units  0-20 Units Subcutaneous TID WC Venetia Maxon Rama, MD   4 Units at 10/25/14 1235  . insulin aspart (novoLOG) injection 0-5 Units  0-5 Units Subcutaneous QHS Venetia Maxon Rama, MD   2 Units at 10/24/14 2225  . insulin aspart (novoLOG) injection 6 Units  6 Units Subcutaneous TID WC Venetia Maxon Rama, MD   6 Units at 10/25/14 0907  . insulin glargine (LANTUS) injection 15 Units  15 Units Subcutaneous QHS Venetia Maxon Rama, MD   15 Units at 10/24/14 2226  . ipratropium (ATROVENT) nebulizer solution 0.5 mg  0.5 mg Nebulization 3 times per day Florencia Reasons, MD   0.5 mg at 10/25/14 1322  . levalbuterol (XOPENEX) nebulizer solution 1.25 mg  1.25  mg Nebulization 3 times per day Florencia Reasons, MD   1.25 mg at 10/25/14 1309  . losartan (COZAAR) tablet 25 mg  25 mg Oral Daily Florencia Reasons, MD   25 mg at 10/25/14 0948  . metoprolol tartrate (LOPRESSOR) tablet 12.5 mg  12.5 mg Oral BID Florencia Reasons, MD   12.5 mg at 10/25/14 1042  . mirtazapine (REMERON) tablet 15 mg  15 mg Oral QHS Belkys A Regalado, MD   15 mg at 10/24/14 2226  . morphine 2 MG/ML injection 1 mg  1 mg Intravenous Q4H PRN Belkys A Regalado, MD      . oxyCODONE (Oxy IR/ROXICODONE) immediate release tablet 5 mg  5 mg Oral Q4H PRN Belkys A Regalado, MD   5 mg at 10/24/14 2025  . pantoprazole (PROTONIX) EC tablet 40 mg  40 mg Oral Daily Belkys  A Regalado, MD   40 mg at 10/25/14 0948  . piperacillin-tazobactam (ZOSYN) IVPB 3.375 g  3.375 g Intravenous Q8H Christine E Shade, RPH   3.375 g at 10/25/14 1231  . predniSONE (DELTASONE) tablet 10 mg  10 mg Oral Q breakfast Venetia Maxon Rama, MD   10 mg at 10/25/14 0851  . prochlorperazine (COMPAZINE) tablet 10 mg  10 mg Oral Q6H PRN Belkys A Regalado, MD        REVIEW OF SYSTEMS:   Constitutional: Denies fevers, chills or abnormal night sweats Eyes: Denies blurriness of vision, double vision or watery eyes Ears, nose, mouth, throat, and face: Denies mucositis or sore throat Cardiovascular: Denies palpitation, chest discomfort or lower extremity swelling Gastrointestinal:  Denies nausea, heartburn or change in bowel habits Skin: Denies abnormal skin rashes Lymphatics: Denies new lymphadenopathy or easy bruising Neurological:Denies numbness, tingling or new weaknesses Behavioral/Psych: Mood is stable, no new changes  All other systems were reviewed with the patient and are negative.  PHYSICAL EXAMINATION: ECOG PERFORMANCE STATUS: 2 - Symptomatic, <50% confined to bed  Filed Vitals:   10/25/14 1418  BP: 125/84  Pulse: 107  Temp: 98.7 F (37.1 C)  Resp: 16   Filed Weights   10/21/14 2214  Weight: 142 lb 10.2 oz (64.7 kg)    GENERAL:alert,  no distress and comfortable. She looks thin  SKIN: skin color, texture, turgor are normal, no rashes or significant lesions EYES: normal, conjunctiva are pink and non-injected, sclera clear OROPHARYNX:no exudate, no erythema and lips, buccal mucosa, and tongue normal  NECK: supple, thyroid normal size, non-tender, without nodularity LYMPH:  no palpable lymphadenopathy in the cervical, axillary or inguinal LUNGS: clear to auscultation and percussion with normal breathing effort HEART: regular rate & rhythm and no murmurs and no lower extremity edema ABDOMEN:abdomen soft, non-tender and normal bowel sounds Musculoskeletal:no cyanosis of digits and no clubbing  PSYCH: alert & oriented x 3 with fluent speech NEURO: no focal motor/sensory deficits  LABORATORY DATA:  I have reviewed the data as listed Lab Results  Component Value Date   WBC 15.8* 10/25/2014   HGB 9.7* 10/25/2014   HCT 31.2* 10/25/2014   MCV 91.0 10/25/2014   PLT 297 10/25/2014    Recent Labs  12/07/13 1505  02/25/14 1149  07/05/14 1731  10/22/14 0401 10/23/14 0520 10/25/14 0140  NA 139  < > 139  < > 136  < > 138 142 139  K 2.8*  < > 3.3*  < > 4.3  < > 4.0 3.4* 2.5*  CL 105  < > 103  < > 101  < > 106 113* 107  CO2 24  < > 25  < > 24  < > 24 21* 24  GLUCOSE 81  < > 97  < > 171*  < > 291* 342* 206*  BUN 7  < > 8  < > 21  < > '17 17 13  '$ CREATININE 0.9  < > 0.9  < > 0.81  < > 0.88 1.03* 1.00  CALCIUM 9.2  < > 9.7  < > 9.7  < > 9.5 9.1 8.9  GFRNONAA  --   --   --   --  73*  < > >60 55* 57*  GFRAA  --   --   --   --  85*  < > >60 >60 >60  PROT 8.0  --  8.0  --  7.7  --   --   --   --  ALBUMIN 2.9*  --  3.2*  --  2.8*  --   --   --   --   AST 25  --  20  --  16  --   --   --   --   ALT 16  --  16  --  22  --   --   --   --   ALKPHOS 138*  --  126*  --  232*  --   --   --   --   BILITOT 0.3  --  0.6  --  0.5  --   --   --   --   < > = values in this interval not displayed.  RADIOGRAPHIC STUDIES: I have personally  reviewed the radiological images as listed and agreed with the findings in the report. Dg Chest 2 View  10/25/2014   CLINICAL DATA:  Shortness of breath. History of tumor in the left lung.  EXAM: CHEST  2 VIEW  COMPARISON:  10/21/2014, 09/27/2014  FINDINGS: Heart is normal in size. Left upper lobe mass again noted. There are no focal consolidations. No pleural effusions or pulmonary edema. Calcified mediastinal and hilar lymph nodes appear stable.  IMPRESSION: Stable appearance of left upper lobe mass.   Electronically Signed   By: Nolon Nations M.D.   On: 10/25/2014 10:42   Dg Chest 2 View  09/27/2014   CLINICAL DATA:  Tachycardia.  History of left-sided lung tumor.  EXAM: CHEST  2 VIEW  COMPARISON:  07/05/2014 plain film and CT  FINDINGS: Midline trachea. Normal heart size. Atherosclerosis in the transverse aorta. No pleural effusion or pneumothorax. Right upper lobe scarring laterally. Calcified right mid lung granuloma.  Central left upper lobe lung mass measures on the order of 7.3 x 6.0 cm versus 7.7 x 7.0 cm on the prior plain film.  IMPRESSION: Decreased size of a central left upper lobe lung mass since 07/05/2014.  No other acute process or explanation for tachycardia.  Aortic atherosclerosis.   Electronically Signed   By: Abigail Miyamoto M.D.   On: 09/27/2014 15:54   Ct Head Wo Contrast  10/25/2014   CLINICAL DATA:  Headaches for 3 days, history hypertension, COPD  EXAM: CT HEAD WITHOUT CONTRAST  TECHNIQUE: Contiguous axial images were obtained from the base of the skull through the vertex without intravenous contrast.  COMPARISON:  None  FINDINGS: Mild age-related atrophy.  Normal ventricular morphology.  No midline shift or mass effect.  Otherwise normal appearance of brain parenchyma.  No intracranial hemorrhage, mass lesion or evidence acute infarction.  No extra-axial fluid collections.  Atherosclerotic calcifications of the internal carotid arteries at the skullbase.  Small amount of fluid  dependently in sphenoid sinus.  Bones and sinuses otherwise unremarkable.  IMPRESSION: No acute parenchymal brain abnormalities.  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   Electronically Signed   By: Lavonia Dana M.D.   On: 10/25/2014 10:47   Ct Angio Chest Pe W/cm &/or Wo Cm  09/27/2014   CLINICAL DATA:  Increased heart rate. Dizziness. History lung cancer.  EXAM: CT ANGIOGRAPHY CHEST WITH CONTRAST  TECHNIQUE: Multidetector CT imaging of the chest was performed using the standard protocol during bolus administration of intravenous contrast. Multiplanar CT image reconstructions and MIPs were obtained to evaluate the vascular anatomy.  CONTRAST:  16m OMNIPAQUE IOHEXOL 350 MG/ML SOLN  COMPARISON:  Plain film of earlier today and the CT of 07/05/2014  FINDINGS:  Mediastinum/Nodes: The quality of this exam for evaluation of pulmonary embolism is good. No evidence of pulmonary embolism.  No supraclavicular adenopathy. Aortic and branch vessel atherosclerosis. Mild cardiomegaly with no pericardial effusion. Coronary artery atherosclerosis.  Calcified mediastinal and hilar nodes are consistent with old granulomatous disease. No mediastinal or hilar adenopathy.  Lungs/Pleura: No pleural fluid. Lower lobe predominant bronchial wall thickening.  Right upper lobe 5 mm pulmonary nodule on image 40 of series 7 is similar to the prior, most apparent on sagittal reformatted images of the previous exam.  Calcific foci along the right fissures may relate to old granulomatous disease. Central left upper lobe lung mass measures 5.3 x 4.3 cm today versus 6.4 x 5.7 cm on the prior exam. This is again immediately adjacent to with the transverse aorta.  Upper abdomen: Normal imaged portions of the liver, spleen, stomach.  Musculoskeletal: No acute osseous abnormality.  Review of the MIP images confirms the above findings.  IMPRESSION: 1.  No evidence of pulmonary embolism. 2. Left upper  lobe lung mass, decreased in size. 3. No evidence of metastatic disease. A right upper lobe 5 mm pulmonary nodule is unchanged and can be re-evaluated at followup. 4.  Atherosclerosis, including within the coronary arteries.   Electronically Signed   By: Abigail Miyamoto M.D.   On: 09/27/2014 17:08   Dg Chest Portable 1 View  10/21/2014   CLINICAL DATA:  Shortness of breath.  Known lung mass  EXAM: PORTABLE CHEST - 1 VIEW  COMPARISON:  Chest radiograph September 27, 2014 and chest CT September 27, 2014  FINDINGS: The large mass in the anterior segment of the left upper lobe is again noted, not appreciably changed given change in positioning and technique. Lungs elsewhere clear except for scattered calcified granulomas. Heart size and pulmonary vascularity are normal. There are calcified hilar and mediastinal lymph nodes. No adenopathy is appreciable radiographically. No bone lesions.  IMPRESSION: Stable large left upper lobe mass. Evidence of prior granulomatous disease. No edema or consolidation.   Electronically Signed   By: Lowella Grip III M.D.   On: 10/21/2014 16:26    ASSESSMENT & PLAN:  Lung cancer I had a long discussion with the patient. The patient wants to be treated locally instead of going back to [facility]. The plan would be to give her concurrent chemoradiation therapy. I will schedule an outpatient visit next week for further discussion & will arrange radiation oncologist consultation.  Leukocytosis Urinalysis was abnormal. She has improved since she received IV fluids and IV anti-biotic's. I would defer to primary service for further management.  Anemia of chronic disease This is stable. She is not symptomatic. Continue close observation.  Overall, the patient felt she is improving. The plan would be probable discharge from hospital within the next few days. I have made appointment to see her back next week on 11/01/2014 at 1:30 PM, check-in time 1:00.  All questions were answered. The  patient knows to call the clinic with any problems, questions or concerns. I spent 30 minutes counseling the patient face to face. The total time spent in the appointment was 50 minutes and more than 50% was on counseling.     [facility], Allison Deshotels, MD 10/25/2014 4:33 PM

## 2014-10-25 NOTE — Care Management Important Message (Signed)
Important Message  Patient Details  Name: CABRINI RUGGIERI MRN: [id number] Date of Birth: [birth year]   Medicare Important Message Given:  Yes-second notification given    Shelda Altes 10/25/2014, 2:48 [place] Message  Patient Details  Name: LAILANIE HASLEY MRN: [id number] Date of Birth: September 01, 1945   Medicare Important Message Given:  Yes-second notification given    Shelda Altes 10/25/2014, 2:48 PM

## 2014-10-26 ENCOUNTER — Telehealth: Payer: Self-pay | Admitting: *Deleted

## 2014-10-26 DIAGNOSIS — R627 Adult failure to thrive: Secondary | ICD-10-CM

## 2014-10-26 LAB — GLUCOSE, CAPILLARY
GLUCOSE-CAPILLARY: 136 mg/dL — AB (ref 65–99)
GLUCOSE-CAPILLARY: 140 mg/dL — AB (ref 65–99)
GLUCOSE-CAPILLARY: 278 mg/dL — AB (ref 65–99)
Glucose-Capillary: 152 mg/dL — ABNORMAL HIGH (ref 65–99)

## 2014-10-26 LAB — CBC
HCT: 29.7 % — ABNORMAL LOW (ref 36.0–46.0)
HEMOGLOBIN: 9.2 g/dL — AB (ref 12.0–15.0)
MCH: 28.4 pg (ref 26.0–34.0)
MCHC: 31 g/dL (ref 30.0–36.0)
MCV: 91.7 fL (ref 78.0–100.0)
Platelets: 316 10*3/uL (ref 150–400)
RBC: 3.24 MIL/uL — ABNORMAL LOW (ref 3.87–5.11)
RDW: 15.5 % (ref 11.5–15.5)
WBC: 17.5 10*3/uL — ABNORMAL HIGH (ref 4.0–10.5)

## 2014-10-26 LAB — CULTURE, BLOOD (ROUTINE X 2)
CULTURE: NO GROWTH
Culture: NO GROWTH

## 2014-10-26 LAB — BASIC METABOLIC PANEL
ANION GAP: 7 (ref 5–15)
BUN: 12 mg/dL (ref 6–20)
CHLORIDE: 105 mmol/L (ref 101–111)
CO2: 25 mmol/L (ref 22–32)
CREATININE: 0.93 mg/dL (ref 0.44–1.00)
Calcium: 8.7 mg/dL — ABNORMAL LOW (ref 8.9–10.3)
GFR calc Af Amer: >60 mL/min (ref >60)
GFR calc non Af Amer: >60 mL/min (ref >60)
GLUCOSE: 192 mg/dL — AB (ref 65–99)
Potassium: 3.2 mmol/L — ABNORMAL LOW (ref 3.5–5.1)
SODIUM: 137 mmol/L (ref 135–145)

## 2014-10-26 LAB — LACTIC ACID, PLASMA: Lactic Acid, Venous: 1.2 mmol/L (ref 0.5–2.0)

## 2014-10-26 LAB — MAGNESIUM: MAGNESIUM: 1.5 mg/dL — AB (ref 1.7–2.4)

## 2014-10-26 MED ORDER — MAGNESIUM SULFATE 2 GM/50ML IV SOLN
2.0000 g | Freq: Once | INTRAVENOUS | Status: AC
  Administered 2014-10-26: 2 g via INTRAVENOUS
  Filled 2014-10-26: qty 50

## 2014-10-26 MED ORDER — POTASSIUM CHLORIDE CRYS ER 20 MEQ PO TBCR
40.0000 meq | EXTENDED_RELEASE_TABLET | Freq: Once | ORAL | Status: AC
  Administered 2014-10-26: 40 meq via ORAL
  Filled 2014-10-26: qty 2

## 2014-10-26 NOTE — Progress Notes (Signed)
Physical Therapy Treatment Patient Details Name: Kelli Reyes MRN: [id number] DOB: [birth year] Today's Date: 10/26/2014    History of Present Illness 69 yo female admitted with sepsis, weakness. Hx of COPD, lung cancer, HTN, OA. Pt is from home alone.     PT Comments    Pt remains unsteady and at risk for falls. Discussed use of RW-pt needs it but she states she will likely not use it. Stability would improve with use of walker.   Follow Up Recommendations  Home health PT;Supervision - Intermittent     Equipment Recommendations  Rolling walker with 5" wheels (pt needs but she states she will likely not use)    Recommendations for Other Services       Precautions / Restrictions Precautions Precautions: Fall Restrictions Weight Bearing Restrictions: No    Mobility  Bed Mobility Overal bed mobility: Modified Independent                Transfers Overall transfer level: Needs assistance Equipment used: None Transfers: Sit to/from Stand Sit to Stand: Min guard         General transfer comment: close guard for safety. pt is unsteady  Ambulation/Gait Ambulation/Gait assistance: Min assist Ambulation Distance (Feet):  (125'x1, 75'x1) Assistive device: None Gait Pattern/deviations: Step-through pattern;Decreased stride length;Decreased step length - right;Decreased step length - left     General Gait Details: Assist needed to stabilize throughout ambulation distance. Pt is unsteady and at risk for falls. Seated rest break needed after ~125 feet.    Stairs            Wheelchair Mobility    Modified Rankin (Stroke Patients Only)       Balance     Sitting balance-Leahy Scale: Good     Standing balance support: During functional activity Standing balance-Leahy Scale: Fair                      Cognition Arousal/Alertness: Awake/alert Behavior During Therapy: WFL for tasks assessed/performed Overall Cognitive Status: Within Functional  Limits for tasks assessed Area of Impairment: Awareness               General Comments: poor safety awareness with regard to fall risk    Exercises General Exercises - Lower Extremity Hip ABduction/ADduction: AROM;Both;10 reps;Standing Heel Raises: AROM;Both;10 reps;Standing    General Comments        Pertinent Vitals/Pain Pain Assessment: No/denies pain    Home Living Family/patient expects to be discharged to:: Private residence Living Arrangements: Alone   Type of Home: House Home Access: Stairs to enter   Home Layout: One level Home Equipment: Grab bars - tub/shower      Prior Function Level of Independence: Independent          PT Goals (current goals can now be found in the care plan section) Acute Rehab PT Goals Patient Stated Goal: to go home. Progress towards PT goals: Progressing toward goals    Frequency  Min 3X/week    PT Plan Current plan remains appropriate    Co-evaluation             End of Session Equipment Utilized During Treatment: Gait belt Activity Tolerance: Patient tolerated treatment well Patient left: in bed;with call bell/phone within reach;with bed alarm set;with family/visitor present     Time: 1140-1157 PT Time Calculation (min) (ACUTE ONLY): 17 min  Charges:  $Gait Training: 8-22 mins  G Codes:      Weston Anna, MPT Pager: [phone]

## 2014-10-26 NOTE — Telephone Encounter (Signed)
Patient's son Alaiya Martindelcampo called requesting "appointment for next Monday be changed to this Friday.  Friday would be better for all the kids. If this is not doable we'll leave the appointment as is.  My return number is [phone]."

## 2014-10-26 NOTE — Evaluation (Signed)
Occupational Therapy Evaluation Patient Details Name: Kelli Reyes MRN: [id number] DOB: 06-14-1945 Today's Date: 10/26/2014    History of Present Illness 69 yo female admitted with sepsis, weakness. Hx of COPD, lung cancer, HTN, OA. Pt is from home alone.    Clinical Impression   Pt overall at min guard assist with ADL. She had one LOB in the bathroom, stepping back to the commode. Cautioned pt to go slower for safety with turns. Pt able to self correct her balance. She lives alone and may benefit from a [facility] aide initially. Will follow for acute OT to progress ADL independence.     Follow Up Recommendations  No OT follow up;Supervision - Intermittent;Other (comment) (possbily HH aide for a few days)    Equipment Recommendations  None recommended by OT    Recommendations for Other Services       Precautions / Restrictions Precautions Precautions: Fall      Mobility Bed Mobility Overal bed mobility: Modified Independent                Transfers Overall transfer level: Needs assistance Equipment used: None   Sit to Stand: Min guard         General transfer comment: min guard to sit back on commode as pt became unsteady and had LOB but able to self correct.    Balance     Sitting balance-Leahy Scale: Good       Standing balance-Leahy Scale: Fair                              ADL Overall ADL's : Needs assistance/impaired Eating/Feeding: Independent;Sitting   Grooming: Wash/dry hands;Supervision/safety;Standing   Upper Body Bathing: Set up;Sitting   Lower Body Bathing: Min guard;Sit to/from stand   Upper Body Dressing : Set up;Sitting   Lower Body Dressing: Min guard;Sit to/from stand   Toilet Transfer: Min guard;Ambulation;Comfort height toilet   Toileting- Clothing Manipulation and Hygiene: Min guard;Sit to/from stand   Tub/ Shower Transfer: Walk-in shower;Min guard     General ADL Comments: Pt stating she declines going to one  of her children's house for a few days as she wants to go to her own home. Discussed option of an aide at her house also. Pt did have one LOB in the bathroom stepping back to the commode but was able to take an extra step back and catch herself. Encouraged her to go slower with turns and pay attention to turns. otherwise, pt did very well with functional tasks in the room.      Vision     Perception     Praxis      Pertinent Vitals/Pain Pain Assessment: No/denies pain     Hand Dominance     Extremity/Trunk Assessment Upper Extremity Assessment Upper Extremity Assessment: Overall WFL for tasks assessed           Communication Communication Communication: No difficulties   Cognition Arousal/Alertness: Awake/alert Behavior During Therapy: WFL for tasks assessed/performed Overall Cognitive Status: Within Functional Limits for tasks assessed                     General Comments       Exercises       Shoulder Instructions      Home Living Family/patient expects to be discharged to:: Private residence Living Arrangements: Alone   Type of Home: House Home Access: Stairs to enter [employer] of Steps: 4  Home Layout: One level     Bathroom Shower/Tub: Occupational psychologist: Standard     Home Equipment: Grab bars - tub/shower          Prior Functioning/Environment Level of Independence: Independent             OT Diagnosis: Generalized weakness   OT Problem List: Decreased strength;Decreased knowledge of use of DME or AE   OT Treatment/Interventions: Self-care/ADL training;Patient/family education;Therapeutic activities;DME and/or AE instruction    OT Goals(Current goals can be found in the care plan section) Acute Rehab OT Goals Patient Stated Goal: to go home. OT Goal Formulation: With patient Time For Goal Achievement: 11/09/14 Potential to Achieve Goals: Good  OT Frequency: Min 2X/week   Barriers to D/C:             Co-evaluation              End of Session    Activity Tolerance: Patient tolerated treatment well Patient left: in bed;with call bell/phone within reach;with bed alarm set   Time: 1038-1050 OT Time Calculation (min): 12 min Charges:  OT General Charges $OT Visit: 1 Procedure OT Evaluation $Initial OT Evaluation Tier I: 1 Procedure G-Codes:    Jules Schick  [phone] 10/26/2014, 11:04 AM

## 2014-10-26 NOTE — Care Management Note (Signed)
Case Management Note  Patient Details  Name: Kelli Reyes MRN: [id number] Date of Birth: 05/30/1945  Subjective/Objective: PT-recc HHPT. Provided patient w/HHC agency list, she states she will check w/family & let me know.Will need HHPT order.                   Action/Plan:d/c plan home w/HHC.   Expected Discharge Date:   (unknown)               Expected Discharge Plan:  [place]  In-House Referral:  NA  Discharge planning Services  CM Consult  Post Acute Care Choice:  NA Choice offered to:  Patient  DME Arranged:  N/A DME Agency:  NA  HH Arranged:    [place] Agency:  NA  Status of Service:  In process, will continue to follow  Medicare Important Message Given:  Yes-second notification given Date Medicare IM Given:    Medicare IM give by:    Date Additional Medicare IM Given:    Additional Medicare Important Message give by:     If discussed at [place] of Stay Meetings, dates discussed:    Additional Comments:  Dessa Phi, RN 10/26/2014, 1:19 PM

## 2014-10-26 NOTE — Progress Notes (Signed)
ANTIBIOTIC CONSULT NOTE - FOLLOW UP  Pharmacy Consult for zosyn Indication: sepsis, PNA  Allergies  Allergen Reactions  . Paclitaxel Anaphylaxis    Pt with severe reaction 4 minutes into first paclitaxel infusion.   . Ibuprofen     REACTION: rash    Patient Measurements: Height: '5\' 2"'$  (157.5 cm) Weight: 142 lb 10.2 oz (64.7 kg) IBW/kg (Calculated) : 50.1  Vital Signs: Temp: 100.2 F (37.9 C) (08/02 0502) Temp Source: Oral (08/02 0502) BP: 142/90 mmHg (08/02 0923) Pulse Rate: 121 (08/02 0923) Intake/Output from previous day: 08/01 0701 - 08/02 0700 In: 680 [P.O.:480; IV Piggyback:200] Out: 501 [Urine:500; Stool:1] Intake/Output from this shift:    Labs:  Recent Labs  10/24/14 0719 10/25/14 0140 10/26/14 0355  WBC 15.3* 15.8* 17.5*  HGB 9.4* 9.7* 9.2*  PLT 286 297 316  CREATININE  --  1.00 0.93   Estimated Creatinine Clearance: 51.8 mL/min (by C-G formula based on Cr of 0.93).  Recent Labs  10/23/14 2057  [facility] 16     Microbiology: Recent Results (from the past 720 hour(s))  Urine culture     Status: None   Collection Time: 09/27/14  4:25 PM  Result Value Ref Range Status   Specimen Description URINE, CLEAN CATCH  Final   Special Requests NONE  Final   Culture   Final    MULTIPLE SPECIES PRESENT, SUGGEST RECOLLECTION IF CLINICALLY INDICATED Performed at [facility]    Report Status 09/29/2014 FINAL  Final  Blood culture (routine x 2)     Status: None   Collection Time: 09/27/14  5:55 PM  Result Value Ref Range Status   Specimen Description BLOOD RIGHT ANTECUBITAL  Final   Special Requests BOTTLES DRAWN AEROBIC AND ANAEROBIC 5CC  Final   Culture   Final    NO GROWTH 5 DAYS Performed at [facility]    Report Status 10/02/2014 FINAL  Final  Culture, blood (routine x 2)     Status: None (Preliminary result)   Collection Time: 10/21/14  4:10 PM  Result Value Ref Range Status   Specimen Description BLOOD BLOOD LEFT ARM  Final    Special Requests BOTTLES DRAWN AEROBIC AND ANAEROBIC 5ML  Final   Culture   Final    NO GROWTH 4 DAYS Performed at [facility]    Report Status PENDING  Incomplete  Culture, blood (routine x 2)     Status: None (Preliminary result)   Collection Time: 10/21/14  8:25 PM  Result Value Ref Range Status   Specimen Description BLOOD BLOOD RIGHT HAND  Final   Special Requests BOTTLES DRAWN AEROBIC ONLY 3ML  Final   Culture   Final    NO GROWTH 4 DAYS Performed at [facility]    Report Status PENDING  Incomplete  Urine culture     Status: None   Collection Time: 10/21/14  8:50 PM  Result Value Ref Range Status   Specimen Description URINE, CATHETERIZED  Final   Special Requests NONE  Final   Culture   Final    NO GROWTH 2 DAYS Performed at [facility]    Report Status 10/23/2014 FINAL  Final  MRSA PCR Screening     Status: None   Collection Time: 10/21/14 10:29 PM  Result Value Ref Range Status   MRSA by PCR NEGATIVE NEGATIVE Final    Comment:        The GeneXpert MRSA Assay (FDA approved for NASAL specimens only), is  one component of a comprehensive MRSA colonization surveillance program. It is not intended to diagnose MRSA infection nor to guide or monitor treatment for MRSA infections.     Anti-infectives    Start     Dose/Rate Route Frequency Ordered Stop   10/22/14 1000  vancomycin (VANCOCIN) 500 mg in sodium chloride 0.9 % 100 mL IVPB  Status:  Discontinued     500 mg 100 mL/hr over 60 Minutes Intravenous Every 12 hours 10/21/14 2213 10/25/14 0918   10/22/14 0400  piperacillin-tazobactam (ZOSYN) IVPB 3.375 g     3.375 g 12.5 mL/hr over 240 Minutes Intravenous Every 8 hours 10/21/14 2213     10/21/14 2215  vancomycin (VANCOCIN) IVPB 1000 mg/200 mL premix     1,000 mg 200 mL/hr over 60 Minutes Intravenous STAT 10/21/14 2212 10/21/14 2349   10/21/14 2215  piperacillin-tazobactam (ZOSYN) IVPB 3.375 g     3.375 g 100 mL/hr over 30 Minutes  Intravenous STAT 10/21/14 2212 10/21/14 2256   10/21/14 1845  cefTRIAXone (ROCEPHIN) 1 g in dextrose 5 % 50 mL IVPB     1 g 100 mL/hr over 30 Minutes Intravenous  Once 10/21/14 1832 10/21/14 2106      Assessment: Patient's a 69 y.o F on zosyn day #6 for suspected sepsis and PNA.  Renal function remains stable and all cultures have been negative thus far.  - Tmax 100.2, wbc up 17.5, scr stable (crcl~48)  7/28 >> Ceftriaxone x1 7/28 >> Vanc >> 8/1 7/28 >> Zosyn Rx>>    7/28 blood x2: ngtd 7/28 urine (cath): ngF  Dose changes/levels: 7/30 2100 VT = 16  Plan:  - continue zosyn 3.375 gm IV q8h (infuse over 4 hours) - pharmacy will sign off but will intervene/adj dose if crcl<20  Tadhg Eskew P 10/26/2014,10:15 AM

## 2014-10-26 NOTE — Telephone Encounter (Signed)
LVM for son informing him of Dr. Calton Dach message below.  Asked him to call back to let us know if they want to move appt to 8/12 or leave on 8/8 as scheduled.

## 2014-10-26 NOTE — Care Management Note (Signed)
Case Management Note  Patient Details  Name: Kelli Reyes MRN: [id number] Date of Birth: Jul 27, 1945  Subjective/Objective:                    Action/Plan:   Expected Discharge Date:   (unknown)               Expected Discharge Plan:  [place]  In-House Referral:  NA  Discharge planning Services  CM Consult  Post Acute Care Choice:  NA Choice offered to:  Patient  DME Arranged:  N/A DME Agency:  NA  HH Arranged:    [place] Agency:  NA  Status of Service:  In process, will continue to follow  Medicare Important Message Given:  Yes-second notification given Date Medicare IM Given:    Medicare IM give by:    Date Additional Medicare IM Given:    Additional Medicare Important Message give by:     If discussed at [place] of Stay Meetings, dates discussed:  10/26/14  Additional Comments:  Dessa Phi, RN 10/26/2014, 1:44 PM

## 2014-10-26 NOTE — Consult Note (Signed)
   [facility] CM Inpatient Consult   10/26/2014  Kelli Reyes 06/25/1945 [id number]   Patient evaluated for [place] Management services. Spoke to patient and family member at bedside about [place] Management. She reports that she is not interested in [place] Management at this time. Accepted brochure at bedside to call in future if needed. Made inpatient RNCM aware that patient declined services.  Marthenia Rolling, MSN-Ed, RN,BSN [facility] Liaison [phone]

## 2014-10-26 NOTE — Telephone Encounter (Signed)
This nurse called patient's son.  He reports to "leave appointment as scheduled.  Thanks, but I do not want to prolong it."

## 2014-10-26 NOTE — Progress Notes (Signed)
Progress Note   VASSIE KUGEL RSW:[id number] DOB: September 13, 1945 DOA: 10/21/2014 PCP: Scarlette Calico, MD  Oncologist: Dr. Varney Biles (sees his NP Tommi Rumps): [phone] / [phone]   Brief Narrative:   Kelli Reyes is an 69 y.o. female PMH of COPD, lung cancer (follows at [facility]) was admitted 10/21/58 with a chief complaint of weakness and anorexia. On initial evaluation, the patient was found to be hypotensive with pressures in the 80s. Lactic acid was elevated at 2.4, WBC 25, urinalysis with positive nitrates and small leukocytes, chest x-ray showing a large left upper lobe mass. Patient is currently being treated for sepsis with broad-spectrum antibiotics pending culture data.  Assessment/Plan:   Principal Problem:   Sepsis secondary to probable postobstructive pneumonia - Blood cultures negative to date, urine culture negative. - recieved empiric vancomycin and Zosyn, culture no growth, mrsa screening negative, will d/c  Vanc, continue zosyn - Blood pressure stable, weaned stress dose steroids, resume maintenance dose of steroids. -  WBC improving, 25 on admission, now 15.8 -add mucinex, repeat cxr,ambulate check pulse oxygen ( addendum: ambulated oxygen saturation above 95% but sinus tachycardia into 140's, will change duoneb to xopenex/atrovent, decreased dose of losartan, add low dose lopressor, tsh wnl) -worsening of leukocytosis, persistent sinus tachycardia, cxr no interval changes, continue current regimen.  Active Problems:   Chest heaviness - 12 lead EKG sinus tachycardia, occasional pvc's, negative troponins. Repeat cxr, stable    Hypokalemia/hypomagnesemia - replacek/mag, held HCTZ    Anemia in neoplastic disease - 3 g drop in hemoglobin over admission values, likely secondary to dilutional factors. - Monitor and transfuse as needed.    Acute encephalopathy - Likely secondary to infectious process. Mental status back to baseline.    Essential  hypertension, benign - Antihypertensives currently on hold. Takes Hyzaar at baseline. Will resume.    COPD GOLD II - Continue Pulmicort and broncho-dilators.    Hyperglycemia - Steroid-induced. Currently on SSI, resistant scale, 3 times a day, 6 units of meal coverage and 15 units of Lantus daily. - CBG [id number]. Glycemic control should improve with reduction in steroids dose.    Primary cancer of left upper lobe of lung - Follows at [facility]. Diagnosed 04/2014.  - Has undergone neo-adjuvant chemotherapy with carboplatinum and gemcitabine. -Plan was to pursue concurrent chemoradiation, Patient wants to have her  treatment done locally, oncology consulted.  Headache, CT head negative    DVT Prophylaxis - Lovenox ordered.  Family Communication: Son updated at the bedside 10/22/14, son updated over the phone on 8/1 and 8/2 at [phone]. Disposition Plan: Home with [place] , likely 8/3 Code Status:     Code Status Orders        Start     Ordered   10/21/14 2213  Full code   Continuous     10/21/14 2212        IV Access:    Peripheral IV   Procedures and diagnostic studies:   Dg Chest 2 View  10/25/2014   CLINICAL DATA:  Shortness of breath. History of tumor in the left lung.  EXAM: CHEST  2 VIEW  COMPARISON:  10/21/2014, 09/27/2014  FINDINGS: Heart is normal in size. Left upper lobe mass again noted. There are no focal consolidations. No pleural effusions or pulmonary edema. Calcified mediastinal and hilar lymph nodes appear stable.  IMPRESSION: Stable appearance of left upper lobe mass.   Electronically Signed   By: Nolon Nations M.D.   On:  10/25/2014 10:42   Ct Head Wo Contrast  10/25/2014   CLINICAL DATA:  Headaches for 3 days, history hypertension, COPD  EXAM: CT HEAD WITHOUT CONTRAST  TECHNIQUE: Contiguous axial images were obtained from the base of the skull through the vertex without intravenous contrast.  COMPARISON:  None  FINDINGS: Mild age-related atrophy.  Normal  ventricular morphology.  No midline shift or mass effect.  Otherwise normal appearance of brain parenchyma.  No intracranial hemorrhage, mass lesion or evidence acute infarction.  No extra-axial fluid collections.  Atherosclerotic calcifications of the internal carotid arteries at the skullbase.  Small amount of fluid dependently in sphenoid sinus.  Bones and sinuses otherwise unremarkable.  IMPRESSION: No acute parenchymal brain abnormalities.  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   Electronically Signed   By: Lavonia Dana M.D.   On: 10/25/2014 10:47     Medical Consultants:    None.  Anti-Infectives:    Vancomycin 10/21/14--->8/1  Zosyn 10/21/14--->  Subjective:   Jama Flavors reported feeling weak, wheezing this am, better after nebs   Objective:    Filed Vitals:   10/26/14 0923 10/26/14 1045 10/26/14 1400 10/26/14 1508  BP: 142/90  119/71   Pulse: 121  104   Temp:   97.9 F (36.6 C)   TempSrc:   Oral   Resp:   18   Height:      Weight:      SpO2: 100% 95% 100% 98%    Intake/Output Summary (Last 24 hours) at 10/26/14 1748 Last data filed at 10/26/14 1443  Gross per 24 hour  Intake    230 ml  Output    500 ml  Net   -270 ml    Exam: Gen:  Frail,  But NAD Cardiovascular:  Tachycardic, No M/R/G Respiratory:  Lungs diminished, occasional rhonchi, crackles at bases Gastrointestinal:  Abdomen soft, NT/ND, + BS Extremities:  Clubbing present, no edema   Data Reviewed:    Labs: Basic Metabolic Panel:  Recent Labs Lab 10/21/14 1640 10/22/14 0401 10/23/14 0520 10/25/14 0140 10/26/14 0355  NA 134* 138 142 139 137  K 3.9 4.0 3.4* 2.5* 3.2*  CL 97* 106 113* 107 105  CO2  --  24 21* 24 25  GLUCOSE 361* 291* 342* 206* 192*  BUN 24* '17 17 13 12  '$ CREATININE 1.00 0.88 1.03* 1.00 0.93  CALCIUM  --  9.5 9.1 8.9 8.7*  MG  --   --   --  1.1* 1.5*   GFR Estimated Creatinine Clearance: 51.8 mL/min (by C-G formula  based on Cr of 0.93).  CBC:  Recent Labs Lab 10/21/14 1610  10/22/14 0401 10/23/14 0520 10/24/14 0719 10/25/14 0140 10/26/14 0355  WBC 25.1*  --  20.2* 16.7* 15.3* 15.8* 17.5*  NEUTROABS 23.0*  --   --   --   --   --   --   HGB 11.9*  < > 9.6* 8.9* 9.4* 9.7* 9.2*  HCT 37.6  < > 32.0* 29.2* 30.4* 31.2* 29.7*  MCV 93.3  --  95.0 92.7 91.3 91.0 91.7  PLT 320  --  290 246 286 297 316  < > = values in this interval not displayed. BNP (last 3 results)  Recent Labs  04/22/14 1426  PROBNP 21.0   CBG:  Recent Labs Lab 10/25/14 1208 10/25/14 1656 10/25/14 2153 10/26/14 0732 10/26/14 1145  GLUCAP 155* 211* 202* 136* 140*   Sepsis Labs:  Recent Labs Lab  10/21/14 1641  10/23/14 0520 10/24/14 0719 10/25/14 0140 10/26/14 0355  WBC  --   < > 16.7* 15.3* 15.8* 17.5*  LATICACIDVEN 2.43*  --   --   --   --  1.2  < > = values in this interval not displayed. Microbiology Recent Results (from the past 240 hour(s))  Culture, blood (routine x 2)     Status: None   Collection Time: 10/21/14  4:10 PM  Result Value Ref Range Status   Specimen Description BLOOD BLOOD LEFT ARM  Final   Special Requests BOTTLES DRAWN AEROBIC AND ANAEROBIC 5ML  Final   Culture   Final    NO GROWTH 5 DAYS Performed at [facility]    Report Status 10/26/2014 FINAL  Final  Culture, blood (routine x 2)     Status: None   Collection Time: 10/21/14  8:25 PM  Result Value Ref Range Status   Specimen Description BLOOD BLOOD RIGHT HAND  Final   Special Requests BOTTLES DRAWN AEROBIC ONLY 3ML  Final   Culture   Final    NO GROWTH 5 DAYS Performed at [facility]    Report Status 10/26/2014 FINAL  Final  Urine culture     Status: None   Collection Time: 10/21/14  8:50 PM  Result Value Ref Range Status   Specimen Description URINE, CATHETERIZED  Final   Special Requests NONE  Final   Culture   Final    NO GROWTH 2 DAYS Performed at [facility]    Report Status 10/23/2014  FINAL  Final  MRSA PCR Screening     Status: None   Collection Time: 10/21/14 10:29 PM  Result Value Ref Range Status   MRSA by PCR NEGATIVE NEGATIVE Final    Comment:        The GeneXpert MRSA Assay (FDA approved for NASAL specimens only), is one component of a comprehensive MRSA colonization surveillance program. It is not intended to diagnose MRSA infection nor to guide or monitor treatment for MRSA infections.      Medications:   . antiseptic oral rinse  7 mL Mouth Rinse BID  . budesonide (PULMICORT) nebulizer solution  0.5 mg Nebulization BID  . docusate sodium  100 mg Oral BID  . enoxaparin (LOVENOX) injection  40 mg Subcutaneous QHS  . guaiFENesin  600 mg Oral BID  . insulin aspart  0-20 Units Subcutaneous TID WC  . insulin aspart  0-5 Units Subcutaneous QHS  . insulin aspart  6 Units Subcutaneous TID WC  . insulin glargine  15 Units Subcutaneous QHS  . ipratropium  0.5 mg Nebulization 3 times per day  . levalbuterol  1.25 mg Nebulization 3 times per day  . losartan  25 mg Oral Daily  . metoprolol tartrate  12.5 mg Oral BID  . mirtazapine  15 mg Oral QHS  . pantoprazole  40 mg Oral Daily  . piperacillin-tazobactam (ZOSYN)  IV  3.375 g Intravenous Q8H  . predniSONE  10 mg Oral Q breakfast   Continuous Infusions: . sodium chloride 1,000 mL (10/23/14 1224)    Time spent: 35 minutes. More than 50% of time spent on coordination of care and updating the family.   LOS: 5 days   Brach Birdsall  [facility] Pager [phone].  *Please refer to [email or web address], password [email or web address] to get updated schedule on who will round on this patient, as hospitalists switch teams weekly. If 7PM-7AM, please contact night-coverage at [email or web address], password [facility] for any overnight  needs.  10/26/2014, 5:48 PM

## 2014-10-26 NOTE — Telephone Encounter (Signed)
This Friday would not work but I can see them next Friday 8/12 at 130 pm (check in time 1 pm) Would that work? If so let me know and I will place order for changes

## 2014-10-27 ENCOUNTER — Inpatient Hospital Stay (HOSPITAL_COMMUNITY): Payer: PPO

## 2014-10-27 ENCOUNTER — Encounter (HOSPITAL_COMMUNITY): Payer: Self-pay | Admitting: Radiology

## 2014-10-27 DIAGNOSIS — D630 Anemia in neoplastic disease: Secondary | ICD-10-CM

## 2014-10-27 LAB — GLUCOSE, CAPILLARY
GLUCOSE-CAPILLARY: 91 mg/dL (ref 65–99)
Glucose-Capillary: 138 mg/dL — ABNORMAL HIGH (ref 65–99)
Glucose-Capillary: 200 mg/dL — ABNORMAL HIGH (ref 65–99)
Glucose-Capillary: 144 mg/dL — ABNORMAL HIGH (ref 65–99)

## 2014-10-27 LAB — URINALYSIS, ROUTINE W REFLEX MICROSCOPIC
Bilirubin Urine: NEGATIVE
GLUCOSE, UA: NEGATIVE mg/dL
Hgb urine dipstick: NEGATIVE
KETONES UR: NEGATIVE mg/dL
Nitrite: NEGATIVE
Protein, ur: 30 mg/dL — AB
Specific Gravity, Urine: 1.019 (ref 1.005–1.030)
Urobilinogen, UA: 0.2 mg/dL (ref 0.0–1.0)
pH: 6.5 (ref 5.0–8.0)

## 2014-10-27 LAB — URINE MICROSCOPIC-ADD ON

## 2014-10-27 LAB — CBC
HCT: 29.5 % — ABNORMAL LOW (ref 36.0–46.0)
Hemoglobin: 8.9 g/dL — ABNORMAL LOW (ref 12.0–15.0)
MCH: 27.6 pg (ref 26.0–34.0)
MCHC: 30.2 g/dL (ref 30.0–36.0)
MCV: 91.3 fL (ref 78.0–100.0)
PLATELETS: 352 10*3/uL (ref 150–400)
RBC: 3.23 MIL/uL — ABNORMAL LOW (ref 3.87–5.11)
RDW: 15.4 % (ref 11.5–15.5)
WBC: 16.9 10*3/uL — ABNORMAL HIGH (ref 4.0–10.5)

## 2014-10-27 LAB — BASIC METABOLIC PANEL
Anion gap: 6 (ref 5–15)
BUN: 11 mg/dL (ref 6–20)
CALCIUM: 8.9 mg/dL (ref 8.9–10.3)
CO2: 27 mmol/L (ref 22–32)
CREATININE: 0.87 mg/dL (ref 0.44–1.00)
Chloride: 104 mmol/L (ref 101–111)
GFR calc Af Amer: >60 mL/min (ref >60)
Glucose, Bld: 114 mg/dL — ABNORMAL HIGH (ref 65–99)
POTASSIUM: 3.6 mmol/L (ref 3.5–5.1)
SODIUM: 137 mmol/L (ref 135–145)

## 2014-10-27 LAB — MAGNESIUM: Magnesium: 1.6 mg/dL — ABNORMAL LOW (ref 1.7–2.4)

## 2014-10-27 MED ORDER — MAGNESIUM SULFATE 2 GM/50ML IV SOLN
2.0000 g | Freq: Once | INTRAVENOUS | Status: AC
Start: 2014-10-27 — End: 2014-10-27
  Administered 2014-10-27: 2 g via INTRAVENOUS
  Filled 2014-10-27: qty 50

## 2014-10-27 NOTE — Progress Notes (Signed)
Progress Note   Kelli Reyes FGH:[id number] DOB: [birth year] DOA: 10/21/2014 PCP: Scarlette Calico, MD  Oncologist: Dr. Varney Biles (sees his NP Tommi Rumps): [phone] / [phone]   Brief Narrative:   Kelli Reyes is an 70 y.o. female PMH of COPD, lung cancer (follows at [facility]) was admitted 10/21/58 with a chief complaint of weakness and anorexia. On initial evaluation, the patient was found to be hypotensive with pressures in the 80s. Lactic acid was elevated at 2.4, WBC 25, urinalysis with positive nitrates and small leukocytes, chest x-ray showing a large left upper lobe mass. Patient is currently being treated for sepsis with broad-spectrum antibiotics pending culture data.  Assessment/Plan:   Principal Problem:  Fever: repeat culture, on obvious source, ua/blood culture /CT chest ab pel    Sepsis secondary to probable postobstructive pneumonia on admission - initial Blood cultures negative to date, urine culture negative. - recieved empiric vancomycin and Zosyn, culture no growth, mrsa screening negative, will d/c  Vanc, continue zosyn - Blood pressure stable, weaned stress dose steroids, resume maintenance dose of steroids. -  WBC improving, 25 on admission -develop new fever on 8/3, work up pending  Active Problems:   Chest heaviness - 12 lead EKG sinus tachycardia, occasional pvc's, negative troponins. Repeat cxr, stable    Hypokalemia/hypomagnesemia - replacek/mag, held HCTZ    Anemia in neoplastic disease - 3 g drop in hemoglobin over admission values, likely secondary to dilutional factors. - Monitor and transfuse as needed.    Acute encephalopathy - Likely secondary to infectious process. Mental status back to baseline.    Essential hypertension, benign - decreased losartan -started low dose lopressor due to sinus tachycardia    COPD GOLD II - Continue Pulmicort and broncho-dilators.    Hyperglycemia - Steroid-induced. Currently on SSI,  resistant scale, 3 times a day, 6 units of meal coverage and 15 units of Lantus daily. - CBG [id number]. Glycemic control should improve with reduction in steroids dose.    Primary cancer of left upper lobe of lung - Follows at [facility]. Diagnosed 04/2014.  - Has undergone neo-adjuvant chemotherapy with carboplatinum and gemcitabine. -Plan was to pursue concurrent chemoradiation, Patient wants to have her  treatment done locally, oncology consulted.  Headache, CT head negative    DVT Prophylaxis - Lovenox ordered.  Family Communication: Son updated at the bedside 10/22/14, son updated over the phone on 8/1 and 8/2 at [phone]. Disposition Plan: Home with [place]  Code Status:     Code Status Orders        Start     Ordered   10/21/14 2213  Full code   Continuous     10/21/14 2212        IV Access:    Peripheral IV   Procedures and diagnostic studies:   No results found.   Medical Consultants:    None.  Anti-Infectives:    Vancomycin 10/21/14--->8/1  Zosyn 10/21/14--->  Subjective:   Kelli Reyes spiked fever 101.2 at 4am, denies new complaints, reported baseline DOE   Objective:    Filed Vitals:   10/27/14 0435 10/27/14 0556 10/27/14 0903 10/27/14 1409  BP:  146/89  115/77  Pulse:  111  106  Temp: 101.2 F (38.4 C) 99.3 F (37.4 C)  99.6 F (37.6 C)  TempSrc: Oral Oral  Oral  Resp:    93  Height:      Weight:      SpO2:  96% 95%  99%    Intake/Output Summary (Last 24 hours) at 10/27/14 1413 Last data filed at 10/27/14 1338  Gross per 24 hour  Intake 462.83 ml  Output   1375 ml  Net -912.17 ml    Exam: Gen:  Frail,  But NAD Cardiovascular:  Tachycardic, No M/R/G Respiratory:  Lungs diminished, no rales, no rhonchi, no wheezing this am. Gastrointestinal:  Abdomen soft, NT/ND, + BS Extremities:  Clubbing present, no edema   Data Reviewed:    Labs: Basic Metabolic Panel:  Recent Labs Lab 10/22/14 0401 10/23/14 0520 10/25/14 0140  10/26/14 0355 10/27/14 0426  NA 138 142 139 137 137  K 4.0 3.4* 2.5* 3.2* 3.6  CL 106 113* 107 105 104  CO2 24 21* '24 25 27  '$ GLUCOSE 291* 342* 206* 192* 114*  BUN '17 17 13 12 11  '$ CREATININE 0.88 1.03* 1.00 0.93 0.87  CALCIUM 9.5 9.1 8.9 8.7* 8.9  MG  --   --  1.1* 1.5* 1.6*   GFR Estimated Creatinine Clearance: 55.4 mL/min (by C-G formula based on Cr of 0.87).  CBC:  Recent Labs Lab 10/21/14 1610  10/23/14 0520 10/24/14 0719 10/25/14 0140 10/26/14 0355 10/27/14 0426  WBC 25.1*  < > 16.7* 15.3* 15.8* 17.5* 16.9*  NEUTROABS 23.0*  --   --   --   --   --   --   HGB 11.9*  < > 8.9* 9.4* 9.7* 9.2* 8.9*  HCT 37.6  < > 29.2* 30.4* 31.2* 29.7* 29.5*  MCV 93.3  < > 92.7 91.3 91.0 91.7 91.3  PLT 320  < > 246 286 297 316 352  < > = values in this interval not displayed. BNP (last 3 results)  Recent Labs  04/22/14 1426  PROBNP 21.0   CBG:  Recent Labs Lab 10/26/14 1145 10/26/14 1649 10/26/14 2138 10/27/14 0818 10/27/14 1218  GLUCAP 140* 278* 152* 91 138*   Sepsis Labs:  Recent Labs Lab 10/21/14 1641  10/24/14 0719 10/25/14 0140 10/26/14 0355 10/27/14 0426  WBC  --   < > 15.3* 15.8* 17.5* 16.9*  LATICACIDVEN 2.43*  --   --   --  1.2  --   < > = values in this interval not displayed. Microbiology Recent Results (from the past 240 hour(s))  Culture, blood (routine x 2)     Status: None   Collection Time: 10/21/14  4:10 PM  Result Value Ref Range Status   Specimen Description BLOOD BLOOD LEFT ARM  Final   Special Requests BOTTLES DRAWN AEROBIC AND ANAEROBIC 5ML  Final   Culture   Final    NO GROWTH 5 DAYS Performed at [facility]    Report Status 10/26/2014 FINAL  Final  Culture, blood (routine x 2)     Status: None   Collection Time: 10/21/14  8:25 PM  Result Value Ref Range Status   Specimen Description BLOOD BLOOD RIGHT HAND  Final   Special Requests BOTTLES DRAWN AEROBIC ONLY 3ML  Final   Culture   Final    NO GROWTH 5 DAYS Performed at  [facility]    Report Status 10/26/2014 FINAL  Final  Urine culture     Status: None   Collection Time: 10/21/14  8:50 PM  Result Value Ref Range Status   Specimen Description URINE, CATHETERIZED  Final   Special Requests NONE  Final   Culture   Final    NO GROWTH 2 DAYS Performed at [facility]  Report Status 10/23/2014 FINAL  Final  MRSA PCR Screening     Status: None   Collection Time: 10/21/14 10:29 PM  Result Value Ref Range Status   MRSA by PCR NEGATIVE NEGATIVE Final    Comment:        The GeneXpert MRSA Assay (FDA approved for NASAL specimens only), is one component of a comprehensive MRSA colonization surveillance program. It is not intended to diagnose MRSA infection nor to guide or monitor treatment for MRSA infections.      Medications:   . antiseptic oral rinse  7 mL Mouth Rinse BID  . budesonide (PULMICORT) nebulizer solution  0.5 mg Nebulization BID  . docusate sodium  100 mg Oral BID  . enoxaparin (LOVENOX) injection  40 mg Subcutaneous QHS  . guaiFENesin  600 mg Oral BID  . insulin aspart  0-20 Units Subcutaneous TID WC  . insulin aspart  0-5 Units Subcutaneous QHS  . insulin aspart  6 Units Subcutaneous TID WC  . insulin glargine  15 Units Subcutaneous QHS  . ipratropium  0.5 mg Nebulization 3 times per day  . levalbuterol  1.25 mg Nebulization 3 times per day  . losartan  25 mg Oral Daily  . magnesium sulfate 1 - 4 g bolus IVPB  2 g Intravenous Once  . metoprolol tartrate  12.5 mg Oral BID  . mirtazapine  15 mg Oral QHS  . pantoprazole  40 mg Oral Daily  . piperacillin-tazobactam (ZOSYN)  IV  3.375 g Intravenous Q8H  . predniSONE  10 mg Oral Q breakfast   Continuous Infusions: . sodium chloride 1,000 mL (10/23/14 1224)    Time spent: 35 minutes. More than 50% of time spent on coordination of care and updating the family.   LOS: 6 days   Nevah Dalal  [facility] Pager [phone].  *Please refer to [email or web address], password  [email or web address] to get updated schedule on who will round on this patient, as hospitalists switch teams weekly. If 7PM-7AM, please contact night-coverage at [email or web address], password [email or web address] for any overnight needs.  10/27/2014, 2:13 PM

## 2014-10-28 LAB — CBC
HCT: 30.5 % — ABNORMAL LOW (ref 36.0–46.0)
HEMOGLOBIN: 9.6 g/dL — AB (ref 12.0–15.0)
MCH: 28.7 pg (ref 26.0–34.0)
MCHC: 31.5 g/dL (ref 30.0–36.0)
MCV: 91.3 fL (ref 78.0–100.0)
PLATELETS: 371 10*3/uL (ref 150–400)
RBC: 3.34 MIL/uL — AB (ref 3.87–5.11)
RDW: 15.8 % — AB (ref 11.5–15.5)
WBC: 16.3 10*3/uL — AB (ref 4.0–10.5)

## 2014-10-28 LAB — COMPREHENSIVE METABOLIC PANEL
ALK PHOS: 186 U/L — AB (ref 38–126)
ALT: 13 U/L — AB (ref 14–54)
AST: 12 U/L — ABNORMAL LOW (ref 15–41)
Albumin: 2.3 g/dL — ABNORMAL LOW (ref 3.5–5.0)
Anion gap: 11 (ref 5–15)
BUN: 12 mg/dL (ref 6–20)
CALCIUM: 8.9 mg/dL (ref 8.9–10.3)
CHLORIDE: 101 mmol/L (ref 101–111)
CO2: 26 mmol/L (ref 22–32)
Creatinine, Ser: 0.95 mg/dL (ref 0.44–1.00)
GFR calc Af Amer: >60 mL/min (ref >60)
GLUCOSE: 130 mg/dL — AB (ref 65–99)
Potassium: 3.8 mmol/L (ref 3.5–5.1)
Sodium: 138 mmol/L (ref 135–145)
TOTAL PROTEIN: 6.8 g/dL (ref 6.5–8.1)
Total Bilirubin: 1.4 mg/dL — ABNORMAL HIGH (ref 0.3–1.2)

## 2014-10-28 LAB — GLUCOSE, CAPILLARY
GLUCOSE-CAPILLARY: 118 mg/dL — AB (ref 65–99)
GLUCOSE-CAPILLARY: 257 mg/dL — AB (ref 65–99)
Glucose-Capillary: 148 mg/dL — ABNORMAL HIGH (ref 65–99)

## 2014-10-28 LAB — MAGNESIUM: Magnesium: 1.8 mg/dL (ref 1.7–2.4)

## 2014-10-28 LAB — C DIFFICILE QUICK SCREEN W PCR REFLEX
C DIFFICILE (CDIFF) INTERP: NEGATIVE
C DIFFICLE (CDIFF) ANTIGEN: NEGATIVE
C Diff toxin: NEGATIVE

## 2014-10-28 LAB — LACTIC ACID, PLASMA: Lactic Acid, Venous: 0.9 mmol/L (ref 0.5–2.0)

## 2014-10-28 MED ORDER — GLUCOSE BLOOD VI STRP: ORAL_STRIP | Status: AC

## 2014-10-28 MED ORDER — BLOOD GLUCOSE MONITOR KIT: PACK | Status: AC

## 2014-10-28 MED ORDER — LOSARTAN POTASSIUM 25 MG PO TABS: 25.0000 mg | ORAL_TABLET | Freq: Every day | ORAL | Status: AC

## 2014-10-28 MED ORDER — FLUCONAZOLE 200 MG PO TABS
200.0000 mg | ORAL_TABLET | Freq: Once | ORAL | Status: AC
  Administered 2014-10-28: 200 mg via ORAL
  Filled 2014-10-28: qty 1

## 2014-10-28 MED ORDER — SODIUM CHLORIDE 0.9 % IV BOLUS (SEPSIS)
500.0000 mL | Freq: Once | INTRAVENOUS | Status: AC
  Administered 2014-10-28: 500 mL via INTRAVENOUS

## 2014-10-28 MED ORDER — METFORMIN HCL 500 MG PO TABS: 500.0000 mg | ORAL_TABLET | Freq: Every day | ORAL | Status: AC

## 2014-10-28 MED ORDER — METOPROLOL TARTRATE 25 MG PO TABS: 12.5000 mg | ORAL_TABLET | Freq: Two times a day (BID) | ORAL | Status: AC

## 2014-10-28 MED ORDER — INSULIN GLARGINE 100 UNIT/ML SOLOSTAR PEN: 10.0000 [IU] | PEN_INJECTOR | Freq: Every day | SUBCUTANEOUS | Status: DC

## 2014-10-28 NOTE — Discharge Summary (Addendum)
Discharge Summary  Kelli Reyes AOZ:[id number] DOB: 29-Mar-1945  PCP: Scarlette Calico, MD  Admit date: 10/21/2014 Discharge date: 10/28/2014  Time spent: >34mns  Recommendations for Outpatient Follow-up:  1. F/u with PMD within three weeks, patient is started on metformin, pmd to monitor blood sugar control.  Patient reported she has been on prednisone for several months for appetite, this was started by her oncologist, instructed her to discuss with oncology about prednisone use, if no need, may taper off, then blood sugar should be better, patient declined insulin due to cost and afraid of needles. 2. F/u with oncology Dr. GAlvy Bimleron 8/8  Discharge Diagnoses:  Active Hospital Problems   Diagnosis Date Noted  . Postobstructive pneumonia 10/24/2014  . Chest heaviness 10/24/2014  . Anemia in neoplastic disease 10/22/2014  . Primary cancer of left upper lobe of lung 04/29/2014  . Hypokalemia 01/08/2014  . Hyperglycemia 02/25/2012  . Essential hypertension, benign 06/10/2009  . COPD GOLD II 06/10/2009    Resolved Hospital Problems   Diagnosis Date Noted Date Resolved  . Sepsis due to urinary tract infection 10/21/2014 10/24/2014    Discharge Condition: stable  Diet recommendation: heart healthy/carb modified  Filed Weights   10/21/14 2214  Weight: 64.7 kg (142 lb 10.2 oz)    History of present illness:  Kelli CALAis a 69y.o. female with PMH significant for COPD, [facility]CP due to weakness. Patient relates poor appetite. She has not been eating or drinking fluids for last few days. Her BP was found to be low at her PCP office in the 80.  She denies dysuria, she relates worsening of dry cough. Denies abdominal pain, diarrhea, chest pain.   Evaluation in the ED: Lactic acid at 2.4, glucose at 361, WBC at 25, UA with positive nitrates, small leukocytes, Chest x ary: Stable large left upper lobe mass. Evidence of prior  granulomatous disease. No edema or consolidation. She received IV fluids and IV antibiotics in the Ed.   She is admitted and being treated for sepsis with broad-spectrum antibiotics initially.  Hospital Course:  Active Problems:   Essential hypertension, benign   COPD GOLD II   Hyperglycemia   Hypokalemia   Primary cancer of left upper lobe of lung   Anemia in neoplastic disease   Postobstructive pneumonia   Chest heaviness  Fever: repeat culture no growth, on obvious source, no diarrhea, CT chest ab pel left upper lobe mass with mild ground glass opacity Talked to infectious disease over the phone, agree with current management, fever from drug vs tumor? pulmonology Consulted, think fever is from tumor necrosis, cleared patient to be discharged home without antibiotics and oncology follow up on Monday.   Sepsis secondary to probable postobstructive pneumonia on admission - initial on presentation patient with hypotension 80/50, sinus tachycardia 130's, leukocytosis 25, lactic acidosis, lactic acid 2.4 and confusion.  initial Blood cultures negative to date, urine culture negative. - recieved empiric vancomycin and Zosyn for HCAP, and stress dose steroids, sepsis could be from postobstructive pneumonia. -culture no growth, mrsa screening negative, Vanc d/ced, continue zosyn - at time of discharge, no confusion, wbc down to 16, lactic acidosis resolved, bp stable, bp meds restarted.  Active Problems:  Chest heaviness - 12 lead EKG sinus tachycardia, occasional pvc's, negative troponins. Repeat cxr, stable   Hypokalemia/hypomagnesemia - replacek/mag, HCTZ stopped   Anemia in neoplastic disease  - Monitor and no need of transfusion,  -hgb baseline  around 9.  -initial on presentation hgb of 12.6, likely from hemoconcentration.   Acute encephalopathy - Likely secondary to infectious process. Mental status back to baseline.   Essential hypertension, benign - decreased  losartan, d/c htcz. -started low dose lopressor due to sinus tachycardia   COPD GOLD II - Continue Pulmicort and broncho-dilators.   Hyperglycemia - Steroid-induced. Currently on SSI, resistant scale, 3 times a day, 6 units of meal coverage and 15 units of Lantus daily. - CBG [id number]. Glycemic control should improve with reduction in steroids dose. -a1c 7.9, she is discharged with lantus 10unit qhs and metformin, pmd to continue monitor blood sugar control.  --patient declined insulin due to cost and afraid of needles. -prescribed glucometer, testing strips/lancet. Advise her to check blood sugar tid and bring result to pmd for further meds adjustment.    Primary cancer of left upper lobe of lung - Follows at [facility]. Diagnosed 04/2014.  - Has undergone neo-adjuvant chemotherapy with carboplatinum and gemcitabine. -Plan was to pursue concurrent chemoradiation, Patient wants to have her treatment done locally, oncology consulted. Outpatient appointment made on 8/8.  Headache, CT head negative    Family Communication: Son updated at the bedside 10/22/14, son updated over the phone on 8/1 and 8/2 at [phone]. Disposition Plan: Home with HH  Code Status: full         Abx: Vancomycin 10/21/14--->8/1                      Zosyn 10/21/14--->8/4  Procedures:  none  Consultations:  ID Dr. Baxter Flattery over the phone  Pulmonology Dr. Elsworth Soho  Oncology Dr. Alvy Bimler  Discharge Exam: BP 129/68 mmHg  Pulse 108  Temp(Src) 99.1 F (37.3 C) (Oral)  Resp 18  Ht 5' 2"  (1.575 m)  Wt 64.7 kg (142 lb 10.2 oz)  BMI 26.08 kg/m2  SpO2 98%  Gen: Frail, But NAD Cardiovascular: Tachycardic, No M/R/G Respiratory: Lungs diminished, no rales, no rhonchi, no wheezing this am. Gastrointestinal: Abdomen soft, NT/ND, + BS Extremities: Clubbing present, no edema   Discharge Instructions You were cared for by a hospitalist during your hospital stay. If you have any questions about your  discharge medications or the care you received while you were in the hospital after you are discharged, you can call the unit and asked to speak with the hospitalist on call if the hospitalist that took care of you is not available. Once you are discharged, your primary care physician will handle any further medical issues. Please note that NO REFILLS for any discharge medications will be authorized once you are discharged, as it is imperative that you return to your primary care physician (or establish a relationship with a primary care physician if you do not have one) for your aftercare needs so that they can reassess your need for medications and monitor your lab values.      Discharge Instructions    Diet - low sodium heart healthy    Complete by:  As directed      Face-to-face encounter (required for Medicare/Medicaid patients)    Complete by:  As directed   I Rashon Rezek certify that this patient is under my care and that I, or a nurse practitioner or physician's assistant working with me, had a face-to-face encounter that meets the physician face-to-face encounter requirements with this patient on 10/26/2014. The encounter with the patient was in whole, or in part for the following medical condition(s) which is the primary reason for  home health care (List medical condition): FTT/ lung CA/sepsis  The encounter with the patient was in whole, or in part, for the following medical condition, which is the primary reason for home health care:  FTt  I certify that, based on my findings, the following services are medically necessary home health services:  Physical therapy  Reason for Medically Necessary Home Health Services:  Skilled Nursing- Change/Decline in Patient Status  My clinical findings support the need for the above services:  Shortness of breath with activity  Further, I certify that my clinical findings support that this patient is homebound due to:  Shortness of Breath with activity     For  home use only DME Glucometer    Complete by:  As directed      Home Health    Complete by:  As directed   To provide the following care/treatments:  PT     Increase activity slowly    Complete by:  As directed             Medication List    STOP taking these medications        losartan-hydrochlorothiazide 50-12.5 MG per tablet  Commonly known as:  HYZAAR     PRESCRIPTION MEDICATION      TAKE these medications        albuterol 108 (90 BASE) MCG/ACT inhaler  Commonly known as:  PROVENTIL HFA;VENTOLIN HFA  Inhale 2 puffs into the lungs every 6 (six) hours as needed for wheezing or shortness of breath.     beclomethasone 80 MCG/ACT inhaler  Commonly known as:  QVAR  Inhale 2 puffs into the lungs 2 (two) times daily.     blood glucose meter kit and supplies Kit  Dispense based on patient and insurance preference. Use up to four times daily as directed. (FOR ICD-9 250.00, 250.01).     docusate sodium 100 MG capsule  Commonly known as:  COLACE  Take 100 mg by mouth 2 (two) times daily.     FERIVA 21/7 75-1 MG Tabs  Take 1 tablet by mouth daily.     glucose blood test strip  Use as instructed     ipratropium 17 MCG/ACT inhaler  Commonly known as:  ATROVENT HFA  Inhale 2 puffs into the lungs every 6 (six) hours as needed for wheezing.     losartan 25 MG tablet  Commonly known as:  COZAAR  Take 1 tablet (25 mg total) by mouth daily.     MELATONIN PO  Take 1 tablet by mouth at bedtime.     metFORMIN 500 MG tablet  Commonly known as:  GLUCOPHAGE  Take 1 tablet (500 mg total) by mouth daily with breakfast.     metoprolol tartrate 25 MG tablet  Commonly known as:  LOPRESSOR  Take 0.5 tablets (12.5 mg total) by mouth 2 (two) times daily.     mirtazapine 15 MG tablet  Commonly known as:  REMERON  Take 15 mg by mouth at bedtime.     omeprazole 20 MG capsule  Commonly known as:  PRILOSEC  Take 20 mg by mouth every evening.     oxyCODONE 5 MG immediate release  tablet  Commonly known as:  Oxy IR/ROXICODONE  Take 5 mg by mouth every 4 (four) hours as needed for severe pain.     predniSONE 10 MG tablet  Commonly known as:  DELTASONE  Take 10 mg by mouth daily with breakfast.     prochlorperazine 10 MG  tablet  Commonly known as:  COMPAZINE  Take 10 mg by mouth every 6 (six) hours as needed for nausea or vomiting.       Allergies  Allergen Reactions  . Paclitaxel Anaphylaxis    Pt with severe reaction 4 minutes into first paclitaxel infusion.   . Ibuprofen     REACTION: rash   Follow-up Information    Follow up with [facility].   Why:  HHPT,HH Nurse's aide   Contact information:   [address] [place] [address] [phone]       Follow up with Scarlette Calico, MD In 3 weeks.   Specialty:  Internal Medicine   Why:  pmd to monitor blood sugar control   Contact information:   [address] [place] [address] [phone]       Follow up with [facility], NI, MD On 11/01/2014.   Specialty:  Hematology and Oncology   Why:  1pm    Contact information:   [place] [id number] [phone]       Follow up with Scarlette Calico, MD.   Specialty:  Internal Medicine   Why:  your appointment is on 11/15/14 @ 1:15pm   Contact information:   [address] [place] [address] [phone]        The results of significant diagnostics from this hospitalization (including imaging, microbiology, ancillary and laboratory) are listed below for reference.    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 Prior hysterectomy.  EXAM: CT CHEST, ABDOMEN AND PELVIS WITHOUT CONTRAST  TECHNIQUE: Multidetector CT imaging of the chest, abdomen and pelvis was  performed following the standard protocol without IV contrast.  COMPARISON:  Most recent chest CT from 09/27/2014. PET-CT from 01/28/2014. CT abdomen/pelvis from 12/12/2013.  FINDINGS: CT CHEST FINDINGS  Images are motion degraded.  Mediastinum/Nodes: Normal heart size. No pericardial fluid/thickening. There is atherosclerosis of the thoracic aorta, the great vessels of the mediastinum and the coronary arteries, including calcified atherosclerotic plaque in the left anterior descending and left circumflex coronary arteries. Stable aortic annular calcification. Great vessels are normal in course and caliber. Normal visualized thyroid. Normal esophagus. No axillary lymphadenopathy. Numerous coarsely calcified nonenlarged mediastinal and bilateral hilar lymph nodes are unchanged, and are in keeping with prior granulomatous disease. No new pathologically enlarged mediastinal nodes. No gross hilar lymphadenopathy, noting limited sensitivity for the detection of hilar adenopathy on a noncontrast study.  Lungs/Pleura: No pneumothorax. New trace right and small left layering pleural effusions. Medial left upper lobe primary malignancy measures 9.1 x 6.0 cm (series 2/image 16), significantly increased from 5.3 x 4.3 cm on 09/27/2014, again demonstrating a broad base of pleural attachment to the mediastinal and anterior/apical left pleural surface, with extension of the tumor to the upper left hilum and associated worsening extrinsic narrowing of the associated left upper lobe segmental bronchus. There is increased mild patchy ground-glass opacity in the apical left upper lobe surrounding the malignant tumor. Right upper lobe 4 mm noncalcified pulmonary nodule (4/19) is unchanged since 06/19/2010. Nodular scarring in the anterior basilar right lower lobe is unchanged since 06/19/2010. Subpleural 6 mm right lower lobe pulmonary nodule (4/24) is unchanged since 06/19/2010. Subpleural nodularity in the superior left lower lobe  is unchanged since 06/19/2010. Multiple  subcentimeter calcified granulomas are again noted of both lungs. No new significant pulmonary nodules.  Musculoskeletal: Mild degenerative changes in the thoracic spine. No suspicious focal osseous lesions in the chest.  CT ABDOMEN AND PELVIS FINDINGS  Hepatobiliary: Normal liver, with no liver mass. Contracted and grossly normal gallbladder, with no radiopaque cholelithiasis. No biliary ductal dilatation.  Pancreas: Normal.  Spleen: Normal size spleen (craniocaudal splenic length 9.8 cm). No splenic mass.  Adrenals/Urinary Tract: Normal adrenals. Simple 4.1 cm renal cyst in the lateral upper right kidney. Otherwise normal kidneys, with no contour deforming renal mass, no hydronephrosis and no nephrolithiasis. Normal caliber ureters. Minimally distended and grossly normal urinary bladder.  Stomach/Bowel: Grossly normal stomach. Normal caliber small bowel. The appendix is not discretely visualized. There is severe sigmoid diverticulosis, with scattered diverticula throughout the remaining colon. No colonic wall thickening or pericolonic fat stranding to suggest acute diverticulitis.  Vascular/Lymphatic: Atherosclerotic nonaneurysmal abdominal aorta. No new lymphadenopathy in the abdomen or pelvis. Stable coarsely calcified portacaval nodes from prior granulomatous disease.  Reproductive: Status post hysterectomy, with no abnormal findings at the vaginal cuff. No adnexal abnormality.  Other: No pneumoperitoneum, ascites or focal fluid collection.  Musculoskeletal: Stable chronic small bilateral fat containing inguinal hernias, left greater than right. Coarsely calcified bilateral gluteal subcutaneous granulomas. Nonspecific sclerotic lesion in the left iliac bone, unchanged since 12/12/2013. Mild degenerative changes in the lumbar spine. No new focal osseous lesions in the abdomen or pelvis.  IMPRESSION: 1. Interval progression of the medial left upper lobe primary malignancy,  now 9.1 x 6.0 cm, with worsening extrinsic narrowing of the associated left upper lobe segmental bronchus. 2. Increased nonspecific mild patchy ground-glass opacity in the left upper lobe surrounding the neoplasm, which could represent mild postobstructive pneumonitis, peritumoral hemorrhage or small satellite tumor nodules. 3. New trace right and small left layering pleural effusions. 4. No evidence of metastatic disease in the chest, abdomen or pelvis on this noncontrast study. 5. Marked sigmoid diverticulosis, with no evidence of acute diverticulitis. No acute abnormality in the abdomen or pelvis. Several additional chronic findings as above.   Electronically Signed   By: Ilona Sorrel M.D.   On: 10/27/2014 17:14   Dg Chest 2 View  10/25/2014   CLINICAL DATA:  Shortness of breath. History of tumor in the left lung.  EXAM: CHEST  2 VIEW  COMPARISON:  10/21/2014, 09/27/2014  FINDINGS: Heart is normal in size. Left upper lobe mass again noted. There are no focal consolidations. No pleural effusions or pulmonary edema. Calcified mediastinal and hilar lymph nodes appear stable.  IMPRESSION: Stable appearance of left upper lobe mass.   Electronically Signed   By: Nolon Nations M.D.   On: 10/25/2014 10:42   Ct Head Wo Contrast  10/25/2014   CLINICAL DATA:  Headaches for 3 days, history hypertension, COPD  EXAM: CT HEAD WITHOUT CONTRAST  TECHNIQUE: Contiguous axial images were obtained from the base of the skull through the vertex without intravenous contrast.  COMPARISON:  None  FINDINGS: Mild age-related atrophy.  Normal ventricular morphology.  No midline shift or mass effect.  Otherwise normal appearance of brain parenchyma.  No intracranial hemorrhage, mass lesion or evidence acute infarction.  No extra-axial fluid collections.  Atherosclerotic calcifications of the internal carotid arteries at the skullbase.  Small amount of fluid dependently in sphenoid sinus.  Bones and sinuses otherwise unremarkable.   IMPRESSION: No acute parenchymal brain abnormalities.  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  Electronically Signed   By: Lavonia Dana M.D.   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 Prior hysterectomy.  EXAM: CT CHEST, ABDOMEN AND PELVIS WITHOUT CONTRAST  TECHNIQUE: Multidetector CT imaging of the chest, abdomen and pelvis was performed following the standard protocol without IV contrast.  COMPARISON:  Most recent chest CT from 09/27/2014. PET-CT from 01/28/2014. CT abdomen/pelvis from 12/12/2013.  FINDINGS: CT CHEST FINDINGS  Images are motion degraded.  Mediastinum/Nodes: Normal heart size. No pericardial fluid/thickening. There is atherosclerosis of the thoracic aorta, the great vessels of the mediastinum and the coronary arteries, including calcified atherosclerotic plaque in the left anterior descending and left circumflex coronary arteries. Stable aortic annular calcification. Great vessels are normal in course and caliber. Normal visualized thyroid. Normal esophagus. No axillary lymphadenopathy. Numerous coarsely calcified nonenlarged mediastinal and bilateral hilar lymph nodes are unchanged, and are in keeping with prior granulomatous disease. No new pathologically enlarged mediastinal nodes. No gross hilar lymphadenopathy, noting limited sensitivity for the detection of hilar adenopathy on a noncontrast study.  Lungs/Pleura: No pneumothorax. New trace right and small left layering pleural effusions. Medial left upper lobe primary malignancy measures 9.1 x 6.0 cm (series 2/image 16), significantly increased from 5.3 x 4.3 cm on 09/27/2014, again demonstrating a broad base of pleural attachment to the mediastinal and anterior/apical left pleural surface,  with extension of the tumor to the upper left hilum and associated worsening extrinsic narrowing of the associated left upper lobe segmental bronchus. There is increased mild patchy ground-glass opacity in the apical left upper lobe surrounding the malignant tumor. Right upper lobe 4 mm noncalcified pulmonary nodule (4/19) is unchanged since 06/19/2010. Nodular scarring in the anterior basilar right lower lobe is unchanged since 06/19/2010. Subpleural 6 mm right lower lobe pulmonary nodule (4/24) is unchanged since 06/19/2010. Subpleural nodularity in the superior left lower lobe is unchanged since 06/19/2010. Multiple subcentimeter calcified granulomas are again noted of both lungs. No new significant pulmonary nodules.  Musculoskeletal: Mild degenerative changes in the thoracic spine. No suspicious focal osseous lesions in the chest.  CT ABDOMEN AND PELVIS FINDINGS  Hepatobiliary: Normal liver, with no liver mass. Contracted and grossly normal gallbladder, with no radiopaque cholelithiasis. No biliary ductal dilatation.  Pancreas: Normal.  Spleen: Normal size spleen (craniocaudal splenic length 9.8 cm). No splenic mass.  Adrenals/Urinary Tract: Normal adrenals. Simple 4.1 cm renal cyst in the lateral upper right kidney. Otherwise normal kidneys, with no contour deforming renal mass, no hydronephrosis and no nephrolithiasis. Normal caliber ureters. Minimally distended and grossly normal urinary bladder.  Stomach/Bowel: Grossly normal stomach. Normal caliber small bowel. The appendix is not discretely visualized. There is severe sigmoid diverticulosis, with scattered diverticula throughout the remaining colon. No colonic wall thickening or pericolonic fat stranding to suggest acute diverticulitis.  Vascular/Lymphatic: Atherosclerotic nonaneurysmal abdominal aorta. No new lymphadenopathy in the abdomen or pelvis. Stable coarsely calcified portacaval nodes from prior granulomatous disease.  Reproductive: Status post  hysterectomy, with no abnormal findings at the vaginal cuff. No adnexal abnormality.  Other: No pneumoperitoneum, ascites or focal fluid collection.  Musculoskeletal: Stable chronic small bilateral fat containing inguinal hernias, left greater than right. Coarsely calcified bilateral gluteal subcutaneous granulomas. Nonspecific sclerotic lesion in the left iliac bone, unchanged since 12/12/2013. Mild degenerative changes in the lumbar spine. No new focal osseous lesions in  the abdomen or pelvis.  IMPRESSION: 1. Interval progression of the medial left upper lobe primary malignancy, now 9.1 x 6.0 cm, with worsening extrinsic narrowing of the associated left upper lobe segmental bronchus. 2. Increased nonspecific mild patchy ground-glass opacity in the left upper lobe surrounding the neoplasm, which could represent mild postobstructive pneumonitis, peritumoral hemorrhage or small satellite tumor nodules. 3. New trace right and small left layering pleural effusions. 4. No evidence of metastatic disease in the chest, abdomen or pelvis on this noncontrast study. 5. Marked sigmoid diverticulosis, with no evidence of acute diverticulitis. No acute abnormality in the abdomen or pelvis. Several additional chronic findings as above.   Electronically Signed   By: Ilona Sorrel M.D.   On: 10/27/2014 17:14   Dg Chest Portable 1 View  10/21/2014   CLINICAL DATA:  Shortness of breath.  Known lung mass  EXAM: PORTABLE CHEST - 1 VIEW  COMPARISON:  Chest radiograph September 27, 2014 and chest CT September 27, 2014  FINDINGS: The large mass in the anterior segment of the left upper lobe is again noted, not appreciably changed given change in positioning and technique. Lungs elsewhere clear except for scattered calcified granulomas. Heart size and pulmonary vascularity are normal. There are calcified hilar and mediastinal lymph nodes. No adenopathy is appreciable radiographically. No bone lesions.  IMPRESSION: Stable large left upper lobe mass.  Evidence of prior granulomatous disease. No edema or consolidation.   Electronically Signed   By: Lowella Grip III M.D.   On: 10/21/2014 16:26    Microbiology: Recent Results (from the past 240 hour(s))  Culture, blood (routine x 2)     Status: None   Collection Time: 10/21/14  4:10 PM  Result Value Ref Range Status   Specimen Description BLOOD BLOOD LEFT ARM  Final   Special Requests BOTTLES DRAWN AEROBIC AND ANAEROBIC 5ML  Final   Culture   Final    NO GROWTH 5 DAYS Performed at [facility]    Report Status 10/26/2014 FINAL  Final  Culture, blood (routine x 2)     Status: None   Collection Time: 10/21/14  8:25 PM  Result Value Ref Range Status   Specimen Description BLOOD BLOOD RIGHT HAND  Final   Special Requests BOTTLES DRAWN AEROBIC ONLY 3ML  Final   Culture   Final    NO GROWTH 5 DAYS Performed at [facility]    Report Status 10/26/2014 FINAL  Final  Urine culture     Status: None   Collection Time: 10/21/14  8:50 PM  Result Value Ref Range Status   Specimen Description URINE, CATHETERIZED  Final   Special Requests NONE  Final   Culture   Final    NO GROWTH 2 DAYS Performed at [facility]    Report Status 10/23/2014 FINAL  Final  MRSA PCR Screening     Status: None   Collection Time: 10/21/14 10:29 PM  Result Value Ref Range Status   MRSA by PCR NEGATIVE NEGATIVE Final    Comment:        The GeneXpert MRSA Assay (FDA approved for NASAL specimens only), is one component of a comprehensive MRSA colonization surveillance program. It is not intended to diagnose MRSA infection nor to guide or monitor treatment for MRSA infections.   Culture, blood (routine x 2)     Status: None (Preliminary result)   Collection Time: 10/27/14  9:00 AM  Result Value Ref Range Status   Specimen Description BLOOD  RIGHT ARM  Final   Special Requests BOTTLES DRAWN AEROBIC ONLY 10CC  Final   Culture   Final    NO GROWTH 1 DAY Performed at [facility]    Report Status PENDING  Incomplete  Culture, blood (routine x 2)     Status: None (Preliminary result)   Collection Time: 10/27/14  9:08 AM  Result Value Ref Range Status   Specimen Description BLOOD RIGHT HAND  Final   Special Requests IN PEDIATRIC BOTTLE 3CC  Final   Culture   Final    NO GROWTH 1 DAY Performed at [facility]    Report Status PENDING  Incomplete  C difficile quick scan w PCR reflex     Status: None   Collection Time: 10/28/14 10:09 AM  Result Value Ref Range Status   C Diff antigen NEGATIVE NEGATIVE Final   C Diff toxin NEGATIVE NEGATIVE Final   C Diff interpretation Negative for toxigenic C. difficile  Final     Labs: Basic Metabolic Panel:  Recent Labs Lab 10/23/14 0520 10/25/14 0140 10/26/14 0355 10/27/14 0426 10/28/14 0330  NA 142 139 137 137 138  K 3.4* 2.5* 3.2* 3.6 3.8  CL 113* 107 105 104 101  CO2 21* 24 25 27 26   GLUCOSE 342* 206* 192* 114* 130*  BUN 17 13 12 11 12   CREATININE 1.03* 1.00 0.93 0.87 0.95  CALCIUM 9.1 8.9 8.7* 8.9 8.9  MG  --  1.1* 1.5* 1.6* 1.8   Liver Function Tests:  Recent Labs Lab 10/28/14 0330  AST 12*  ALT 13*  ALKPHOS 186*  BILITOT 1.4*  PROT 6.8  ALBUMIN 2.3*   No results for input(s): LIPASE, AMYLASE in the last 168 hours. No results for input(s): AMMONIA in the last 168 hours. CBC:  Recent Labs Lab 10/21/14 1610  10/24/14 0719 10/25/14 0140 10/26/14 0355 10/27/14 0426 10/28/14 0330  WBC 25.1*  < > 15.3* 15.8* 17.5* 16.9* 16.3*  NEUTROABS 23.0*  --   --   --   --   --   --   HGB 11.9*  < > 9.4* 9.7* 9.2* 8.9* 9.6*  HCT 37.6  < > 30.4* 31.2* 29.7* 29.5* 30.5*  MCV 93.3  < > 91.3 91.0 91.7 91.3 91.3  PLT 320  < > 286 297 316 352 371  < > = values in this interval not displayed. Cardiac Enzymes:  Recent Labs Lab 10/24/14 1332 10/24/14 1945 10/25/14 0140  TROPONINI <0.03 <0.03 <0.03   BNP: BNP (last 3 results)  Recent Labs  07/05/14 1731 09/27/14 1502  BNP 127.6*  31.6    ProBNP (last 3 results)  Recent Labs  04/22/14 1426  PROBNP 21.0    CBG:  Recent Labs Lab 10/27/14 1218 10/27/14 1633 10/27/14 2118 10/28/14 0733 10/28/14 1150  GLUCAP 138* 200* 144* 118* 148*       Signed:  Matther Labell MD, PhD  [facility] 10/28/2014, 3:46 PM

## 2014-10-28 NOTE — Care Management Note (Signed)
Case Management Note  Patient Details  Name: Kelli Reyes MRN: [id number] Date of Birth: [birth year]  Subjective/Objective: [facility] rep Cyril Mourning aware of orders. Await d/c order.  Patient declined [facility].                 Action/Plan:d/c plan home.   Expected Discharge Date:   (unknown)               Expected Discharge Plan:  [place]  In-House Referral:  NA  Discharge planning Services  CM Consult  Post Acute Care Choice:  NA Choice offered to:  Patient  DME Arranged:  N/A DME Agency:  NA  HH Arranged:  PT [place] Agency:  NA, [place]  Status of Service:  In process, will continue to follow  Medicare Important Message Given:  Yes-second notification given Date Medicare IM Given:    Medicare IM give by:    Date Additional Medicare IM Given:    Additional Medicare Important Message give by:     If discussed at [place] of Stay Meetings, dates discussed:    Additional Comments:  Dessa Phi, RN 10/28/2014, 1:03 PM

## 2014-10-28 NOTE — Progress Notes (Signed)
Occupational Therapy Treatment Patient Details Name: Kelli Reyes MRN: [id number] DOB: 1945/09/01 Today's Date: 10/28/2014    History of present illness 69 yo female admitted with sepsis, weakness. Hx of COPD, lung cancer, HTN, OA. Pt is from home alone.    OT comments  Pt agreeable to use a walker today but cues to use it safely. She states she would be willing to use the walker at home. HR 110 with activity. Will continue to follow on acute to progress safety with ADL.   Follow Up Recommendations  No OT follow up;Supervision - Intermittent ([place] aide)    Equipment Recommendations   (RW (OT))    Recommendations for Other Services      Precautions / Restrictions Precautions Precautions: Fall       Mobility Bed Mobility               General bed mobility comments: sitting EOB when OT arrived.  Transfers Overall transfer level: Needs assistance Equipment used: Rolling walker (2 wheeled) Transfers: Sit to/from Stand Sit to Stand: Supervision         General transfer comment: cues for hand placement.    Balance                                   ADL       Grooming: Wash/dry hands;Min Dispensing optician: Min guard;Ambulation;BSC;RW   Toileting- Water quality scientist and Hygiene: Min guard;Sit to/from stand         General ADL Comments: Pt agreeable to use walker today for functional transfers and states it did help her feel more stable. She didnt have a LOB using walker today for toilet transfers. She did need cues to use walker correctly including not to pick up the walker. She states she would be agreeable to use walker at home. HR 110 with activity. She was able to have BM so informed nursing tech.       Vision                     Perception     Praxis      Cognition   Behavior During Therapy: WFL for tasks assessed/performed Overall Cognitive Status: Within Functional Limits for  tasks assessed                       Extremity/Trunk Assessment               Exercises     Shoulder Instructions       General Comments      Pertinent Vitals/ Pain       Pain Assessment: No/denies pain  Home Living                                          Prior Functioning/Environment              Frequency Min 2X/week     Progress Toward Goals  OT Goals(current goals can now be found in the care plan section)  Progress towards OT goals: Progressing toward goals     Plan Discharge plan remains appropriate    Co-evaluation  End of Session Equipment Utilized During Treatment: Gait belt;Rolling walker   Activity Tolerance Patient tolerated treatment well   Patient Left in chair;with call bell/phone within reach   Nurse Communication          Time: [phone] OT Time Calculation (min): 18 min  Charges: OT General Charges $OT Visit: 1 Procedure OT Treatments $Therapeutic Activity: 8-22 mins  Jules Schick  [phone] 10/28/2014, 10:15 AM

## 2014-10-28 NOTE — Progress Notes (Signed)
Progress Note   Kelli Reyes TIR:[id number] DOB: 1945-04-16 DOA: 10/21/2014 PCP: Scarlette Calico, MD  Oncologist: Dr. Varney Biles (sees his NP Tommi Rumps): [phone] / [phone]   Brief Narrative:   Kelli Reyes is an 69 y.o. female PMH of COPD, lung cancer (follows at [facility]) was admitted 10/21/58 with a chief complaint of weakness and anorexia. On initial evaluation, the patient was found to be hypotensive with pressures in the 80s. Lactic acid was elevated at 2.4, WBC 25, urinalysis with positive nitrates and small leukocytes, chest x-ray showing a large left upper lobe mass. Patient is currently being treated for sepsis with broad-spectrum antibiotics pending culture data.  Assessment/Plan:   Principal Problem:  Fever: repeat culture no growth, on obvious source, no diarrhea, CT chest ab pel left upper lobe mass with mild ground glass opacity Talked to infectious disease over the phone, agree with current management, fever from drug vs tumor?  pulmonology  Consulted, If patient stable, nothing more to add from infectious disease and pulmonology, likely discharge later today or tomorrow with augmentin and oncology follow up on Monday.    Sepsis secondary to probable postobstructive pneumonia on admission - initial Blood cultures negative to date, urine culture negative. - recieved empiric vancomycin and Zosyn, culture no growth, mrsa screening negative,  Vanc d/ced,  continue zosyn - Blood pressure stable, weaned stress dose steroids, resume maintenance dose of steroids. -  WBC improving, 25 on admission -develop new fever on 8/3 and 8/4 at 4am, no obvious infectious source except tumor fever or drug fever  Active Problems:   Chest heaviness - 12 lead EKG sinus tachycardia, occasional pvc's, negative troponins. Repeat cxr, stable    Hypokalemia/hypomagnesemia - replacek/mag, held HCTZ    Anemia in neoplastic disease - 3 g drop in hemoglobin over admission  values, likely secondary to dilutional factors. - Monitor and transfuse as needed.    Acute encephalopathy - Likely secondary to infectious process. Mental status back to baseline.    Essential hypertension, benign - decreased losartan -started low dose lopressor due to sinus tachycardia    COPD GOLD II - Continue Pulmicort and broncho-dilators.    Hyperglycemia - Steroid-induced. Currently on SSI, resistant scale, 3 times a day, 6 units of meal coverage and 15 units of Lantus daily. - CBG [id number]. Glycemic control should improve with reduction in steroids dose.    Primary cancer of left upper lobe of lung - Follows at [facility]. Diagnosed 04/2014.  - Has undergone neo-adjuvant chemotherapy with carboplatinum and gemcitabine. -Plan was to pursue concurrent chemoradiation, Patient wants to have her  treatment done locally, oncology consulted.  Headache, CT head negative    DVT Prophylaxis - Lovenox ordered.  Family Communication: Son updated at the bedside 10/22/14, son updated over the phone on 8/1 and 8/2 at [phone]. 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 Prior hysterectomy.  EXAM: CT CHEST, ABDOMEN AND PELVIS WITHOUT  CONTRAST  TECHNIQUE: Multidetector CT imaging of the chest, abdomen and pelvis was performed following the standard protocol without IV contrast.  COMPARISON:  Most recent chest CT from 09/27/2014. PET-CT from 01/28/2014. CT abdomen/pelvis from 12/12/2013.  FINDINGS: CT CHEST FINDINGS  Images are motion degraded.  Mediastinum/Nodes: Normal  heart size. No pericardial fluid/thickening. There is atherosclerosis of the thoracic aorta, the great vessels of the mediastinum and the coronary arteries, including calcified atherosclerotic plaque in the left anterior descending and left circumflex coronary arteries. Stable aortic annular calcification. Great vessels are normal in course and caliber. Normal visualized thyroid. Normal esophagus. No axillary lymphadenopathy. Numerous coarsely calcified nonenlarged mediastinal and bilateral hilar lymph nodes are unchanged, and are in keeping with prior granulomatous disease. No new pathologically enlarged mediastinal nodes. No gross hilar lymphadenopathy, noting limited sensitivity for the detection of hilar adenopathy on a noncontrast study.  Lungs/Pleura: No pneumothorax. New trace right and small left layering pleural effusions. Medial left upper lobe primary malignancy measures 9.1 x 6.0 cm (series 2/image 16), significantly increased from 5.3 x 4.3 cm on 09/27/2014, again demonstrating a broad base of pleural attachment to the mediastinal and anterior/apical left pleural surface, with extension of the tumor to the upper left hilum and associated worsening extrinsic narrowing of the associated left upper lobe segmental bronchus. There is increased mild patchy ground-glass opacity in the apical left upper lobe surrounding the malignant tumor. Right upper lobe 4 mm noncalcified pulmonary nodule (4/19) is unchanged since 06/19/2010. Nodular scarring in the anterior basilar right lower lobe is unchanged since 06/19/2010. Subpleural 6 mm right lower lobe pulmonary nodule (4/24) is unchanged since 06/19/2010. Subpleural nodularity in the superior left lower lobe is unchanged since 06/19/2010. Multiple subcentimeter calcified granulomas are again noted of both lungs. No new significant pulmonary nodules.  Musculoskeletal: Mild degenerative changes in the thoracic spine. No suspicious focal osseous lesions in the chest.   CT ABDOMEN AND PELVIS FINDINGS  Hepatobiliary: Normal liver, with no liver mass. Contracted and grossly normal gallbladder, with no radiopaque cholelithiasis. No biliary ductal dilatation.  Pancreas: Normal.  Spleen: Normal size spleen (craniocaudal splenic length 9.8 cm). No splenic mass.  Adrenals/Urinary Tract: Normal adrenals. Simple 4.1 cm renal cyst in the lateral upper right kidney. Otherwise normal kidneys, with no contour deforming renal mass, no hydronephrosis and no nephrolithiasis. Normal caliber ureters. Minimally distended and grossly normal urinary bladder.  Stomach/Bowel: Grossly normal stomach. Normal caliber small bowel. The appendix is not discretely visualized. There is severe sigmoid diverticulosis, with scattered diverticula throughout the remaining colon. No colonic wall thickening or pericolonic fat stranding to suggest acute diverticulitis.  Vascular/Lymphatic: Atherosclerotic nonaneurysmal abdominal aorta. No new lymphadenopathy in the abdomen or pelvis. Stable coarsely calcified portacaval nodes from prior granulomatous disease.  Reproductive: Status post hysterectomy, with no abnormal findings at the vaginal cuff. No adnexal abnormality.  Other: No pneumoperitoneum, ascites or focal fluid collection.  Musculoskeletal: Stable chronic small bilateral fat containing inguinal hernias, left greater than right. Coarsely calcified bilateral gluteal subcutaneous granulomas. Nonspecific sclerotic lesion in the left iliac bone, unchanged since 12/12/2013. Mild degenerative changes in the lumbar spine. No new focal osseous lesions in the abdomen or pelvis.  IMPRESSION: 1. Interval progression of the medial left upper lobe primary malignancy, now 9.1 x 6.0 cm, with worsening extrinsic narrowing of the associated left upper lobe segmental bronchus. 2. Increased nonspecific mild patchy ground-glass opacity in the left upper lobe surrounding the neoplasm, which could represent mild postobstructive  pneumonitis, peritumoral hemorrhage or small  satellite tumor nodules. 3. New trace right and small left layering pleural effusions. 4. No evidence of metastatic disease in the chest, abdomen or pelvis on this noncontrast study. 5. Marked sigmoid diverticulosis, with no evidence of acute diverticulitis. No acute abnormality in the abdomen or pelvis. Several additional chronic findings as above.   Electronically Signed   By: Ilona Sorrel M.D.   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 Prior hysterectomy.  EXAM: CT CHEST, ABDOMEN AND PELVIS WITHOUT CONTRAST  TECHNIQUE: Multidetector CT imaging of the chest, abdomen and pelvis was performed following the standard protocol without IV contrast.  COMPARISON:  Most recent chest CT from 09/27/2014. PET-CT from 01/28/2014. CT abdomen/pelvis from 12/12/2013.  FINDINGS: CT CHEST FINDINGS  Images are motion degraded.  Mediastinum/Nodes: Normal heart size. No pericardial fluid/thickening. There is atherosclerosis of the thoracic aorta, the great vessels of the mediastinum and the coronary arteries, including calcified atherosclerotic plaque in the left anterior descending and left circumflex coronary arteries. Stable aortic annular calcification. Great vessels are normal in course and caliber. Normal visualized thyroid. Normal esophagus. No axillary lymphadenopathy. Numerous coarsely calcified nonenlarged mediastinal and bilateral hilar lymph nodes are unchanged, and are in keeping with prior granulomatous disease. No new pathologically enlarged mediastinal nodes. No gross hilar lymphadenopathy, noting limited sensitivity for the detection of hilar adenopathy on a noncontrast study.  Lungs/Pleura: No pneumothorax. New trace right and small left layering pleural effusions.  Medial left upper lobe primary malignancy measures 9.1 x 6.0 cm (series 2/image 16), significantly increased from 5.3 x 4.3 cm on 09/27/2014, again demonstrating a broad base of pleural attachment to the mediastinal and anterior/apical left pleural surface, with extension of the tumor to the upper left hilum and associated worsening extrinsic narrowing of the associated left upper lobe segmental bronchus. There is increased mild patchy ground-glass opacity in the apical left upper lobe surrounding the malignant tumor. Right upper lobe 4 mm noncalcified pulmonary nodule (4/19) is unchanged since 06/19/2010. Nodular scarring in the anterior basilar right lower lobe is unchanged since 06/19/2010. Subpleural 6 mm right lower lobe pulmonary nodule (4/24) is unchanged since 06/19/2010. Subpleural nodularity in the superior left lower lobe is unchanged since 06/19/2010. Multiple subcentimeter calcified granulomas are again noted of both lungs. No new significant pulmonary nodules.  Musculoskeletal: Mild degenerative changes in the thoracic spine. No suspicious focal osseous lesions in the chest.  CT ABDOMEN AND PELVIS FINDINGS  Hepatobiliary: Normal liver, with no liver mass. Contracted and grossly normal gallbladder, with no radiopaque cholelithiasis. No biliary ductal dilatation.  Pancreas: Normal.  Spleen: Normal size spleen (craniocaudal splenic length 9.8 cm). No splenic mass.  Adrenals/Urinary Tract: Normal adrenals. Simple 4.1 cm renal cyst in the lateral upper right kidney. Otherwise normal kidneys, with no contour deforming renal mass, no hydronephrosis and no nephrolithiasis. Normal caliber ureters. Minimally distended and grossly normal urinary bladder.  Stomach/Bowel: Grossly normal stomach. Normal caliber small bowel. The appendix is not discretely visualized. There is severe sigmoid diverticulosis, with scattered diverticula throughout the remaining colon. No colonic wall thickening or pericolonic fat  stranding to suggest acute diverticulitis.  Vascular/Lymphatic: Atherosclerotic nonaneurysmal abdominal aorta. No new lymphadenopathy in the abdomen or pelvis. Stable coarsely calcified portacaval nodes from prior granulomatous disease.  Reproductive: Status post hysterectomy, with no abnormal findings at the vaginal cuff. No  adnexal abnormality.  Other: No pneumoperitoneum, ascites or focal fluid collection.  Musculoskeletal: Stable chronic small bilateral fat containing inguinal hernias, left greater than right. Coarsely calcified bilateral gluteal subcutaneous granulomas. Nonspecific sclerotic lesion in the left iliac bone, unchanged since 12/12/2013. Mild degenerative changes in the lumbar spine. No new focal osseous lesions in the abdomen or pelvis.  IMPRESSION: 1. Interval progression of the medial left upper lobe primary malignancy, now 9.1 x 6.0 cm, with worsening extrinsic narrowing of the associated left upper lobe segmental bronchus. 2. Increased nonspecific mild patchy ground-glass opacity in the left upper lobe surrounding the neoplasm, which could represent mild postobstructive pneumonitis, peritumoral hemorrhage or small satellite tumor nodules. 3. New trace right and small left layering pleural effusions. 4. No evidence of metastatic disease in the chest, abdomen or pelvis on this noncontrast study. 5. Marked sigmoid diverticulosis, with no evidence of acute diverticulitis. No acute abnormality in the abdomen or pelvis. Several additional chronic findings as above.   Electronically Signed   By: Ilona Sorrel M.D.   On: 10/27/2014 17:14     Medical Consultants:    None.  Anti-Infectives:    Vancomycin 10/21/14--->8/1  Zosyn 10/21/14--->  Subjective:   Tabbatha L Diliberto kept spiking spiked fever 101.2 at 4am, denies new complaints, reported baseline DOE   Objective:    Filed Vitals:   10/28/14 0425 10/28/14 0612 10/28/14 0746 10/28/14 0748  BP: 132/85     Pulse: 116     Temp: 101  F (38.3 C) 98.8 F (37.1 C)    TempSrc: Oral Oral    Resp: 20     Height:      Weight:      SpO2: 100%  95% 95%    Intake/Output Summary (Last 24 hours) at 10/28/14 8453 Last data filed at 10/28/14 0600  Gross per 24 hour  Intake   1030 ml  Output   1125 ml  Net    -95 ml    Exam: Gen:  Frail,  But NAD Cardiovascular:  Tachycardic, No M/R/G Respiratory:  Lungs diminished, no rales, no rhonchi, no wheezing this am. Gastrointestinal:  Abdomen soft, NT/ND, + BS Extremities:  Clubbing present, no edema   Data Reviewed:    Labs: Basic Metabolic Panel:  Recent Labs Lab 10/23/14 0520 10/25/14 0140 10/26/14 0355 10/27/14 0426 10/28/14 0330  NA 142 139 137 137 138  K 3.4* 2.5* 3.2* 3.6 3.8  CL 113* 107 105 104 101  CO2 21* '24 25 27 26  '$ GLUCOSE 342* 206* 192* 114* 130*  BUN '17 13 12 11 12  '$ CREATININE 1.03* 1.00 0.93 0.87 0.95  CALCIUM 9.1 8.9 8.7* 8.9 8.9  MG  --  1.1* 1.5* 1.6* 1.8   GFR Estimated Creatinine Clearance: 50.7 mL/min (by C-G formula based on Cr of 0.95).  CBC:  Recent Labs Lab 10/21/14 1610  10/24/14 0719 10/25/14 0140 10/26/14 0355 10/27/14 0426 10/28/14 0330  WBC 25.1*  < > 15.3* 15.8* 17.5* 16.9* 16.3*  NEUTROABS 23.0*  --   --   --   --   --   --   HGB 11.9*  < > 9.4* 9.7* 9.2* 8.9* 9.6*  HCT 37.6  < > 30.4* 31.2* 29.7* 29.5* 30.5*  MCV 93.3  < > 91.3 91.0 91.7 91.3 91.3  PLT 320  < > 286 297 316 352 371  < > = values in this interval not displayed. BNP (last 3 results)  Recent Labs  04/22/14 1426  PROBNP  21.0   CBG:  Recent Labs Lab 10/27/14 0818 10/27/14 1218 10/27/14 1633 10/27/14 2118 10/28/14 0733  GLUCAP 91 138* 200* 144* 118*   Sepsis Labs:  Recent Labs Lab 10/21/14 1641  10/25/14 0140 10/26/14 0355 10/27/14 0426 10/28/14 0330  WBC  --   < > 15.8* 17.5* 16.9* 16.3*  LATICACIDVEN 2.43*  --   --  1.2  --  0.9  < > = values in this interval not displayed. Microbiology Recent Results (from the past 240  hour(s))  Culture, blood (routine x 2)     Status: None   Collection Time: 10/21/14  4:10 PM  Result Value Ref Range Status   Specimen Description BLOOD BLOOD LEFT ARM  Final   Special Requests BOTTLES DRAWN AEROBIC AND ANAEROBIC 5ML  Final   Culture   Final    NO GROWTH 5 DAYS Performed at [facility]    Report Status 10/26/2014 FINAL  Final  Culture, blood (routine x 2)     Status: None   Collection Time: 10/21/14  8:25 PM  Result Value Ref Range Status   Specimen Description BLOOD BLOOD RIGHT HAND  Final   Special Requests BOTTLES DRAWN AEROBIC ONLY 3ML  Final   Culture   Final    NO GROWTH 5 DAYS Performed at [facility]    Report Status 10/26/2014 FINAL  Final  Urine culture     Status: None   Collection Time: 10/21/14  8:50 PM  Result Value Ref Range Status   Specimen Description URINE, CATHETERIZED  Final   Special Requests NONE  Final   Culture   Final    NO GROWTH 2 DAYS Performed at [facility]    Report Status 10/23/2014 FINAL  Final  MRSA PCR Screening     Status: None   Collection Time: 10/21/14 10:29 PM  Result Value Ref Range Status   MRSA by PCR NEGATIVE NEGATIVE Final    Comment:        The GeneXpert MRSA Assay (FDA approved for NASAL specimens only), is one component of a comprehensive MRSA colonization surveillance program. It is not intended to diagnose MRSA infection nor to guide or monitor treatment for MRSA infections.      Medications:   . antiseptic oral rinse  7 mL Mouth Rinse BID  . budesonide (PULMICORT) nebulizer solution  0.5 mg Nebulization BID  . docusate sodium  100 mg Oral BID  . enoxaparin (LOVENOX) injection  40 mg Subcutaneous QHS  . guaiFENesin  600 mg Oral BID  . insulin aspart  0-20 Units Subcutaneous TID WC  . insulin aspart  0-5 Units Subcutaneous QHS  . insulin aspart  6 Units Subcutaneous TID WC  . insulin glargine  15 Units Subcutaneous QHS  . ipratropium  0.5 mg Nebulization 3 times per  day  . levalbuterol  1.25 mg Nebulization 3 times per day  . losartan  25 mg Oral Daily  . metoprolol tartrate  12.5 mg Oral BID  . mirtazapine  15 mg Oral QHS  . pantoprazole  40 mg Oral Daily  . piperacillin-tazobactam (ZOSYN)  IV  3.375 g Intravenous Q8H  . predniSONE  10 mg Oral Q breakfast   Continuous Infusions: . sodium chloride 1,000 mL (10/23/14 1224)    Time spent: 35 minutes. More than 50% of time spent on coordination of care and updating the family.   LOS: 7 days   Sharetha Newson  [facility] Pager [phone].  *Please refer  to [email or web address], password [email or web address] to get updated schedule on who will round on this patient, as hospitalists switch teams weekly. If 7PM-7AM, please contact night-coverage at [email or web address], password [email or web address] for any overnight needs.  10/28/2014, 8:32 AM

## 2014-10-28 NOTE — Care Management Important Message (Signed)
Important Message  Patient Details  Name: Kelli Reyes MRN: [id number] Date of Birth: 27-May-1945   Medicare Important Message Given:  Yes-third notification given    Camillo Flaming 10/28/2014, 1:38 PMImportant Message  Patient Details  Name: Kelli Reyes MRN: [id number] Date of Birth: 11/05/1945   Medicare Important Message Given:  Yes-third notification given    Camillo Flaming 10/28/2014, 1:38 PM

## 2014-10-28 NOTE — Progress Notes (Signed)
PT Cancellation Note  Patient Details Name: Kelli Reyes MRN: [id number] DOB: 1946-01-22   Cancelled Treatment:    Reason Eval/Treat Not Completed: Attempted PT tx session. Pt declined to participate at this time. Will check back if schedule allows. Thanks.    Weston Anna, MPT Pager: [phone]

## 2014-10-28 NOTE — Progress Notes (Signed)
Went over all discharge information with patient and family.  Prescriptions given.  Teaching done on how to take blood sugar.  Explained improtance of going to follow up appointments.  Pt wheeled out by NT.

## 2014-10-28 NOTE — Care Management Note (Signed)
Case Management Note  Patient Details  Name: Kelli Reyes MRN: [id number] Date of Birth: 15-Jan-1946  Subjective/Objective:  [facility] aware of dme-rw(to be delivered to rm prior d/c), & HHPT/HH Nurse's aide order. D/c home today.                  Action/Plan:d/c home w/HHC, dme.   Expected Discharge Date:   (unknown)               Expected Discharge Plan:  [place]  In-House Referral:  NA  Discharge planning Services  CM Consult  Post Acute Care Choice:  NA Choice offered to:  Patient  DME Arranged:  N/A DME Agency:  NA  HH Arranged:  PT, Nurse's Aide [place] Agency:  NA, [place]  Status of Service:  Completed, signed off  Medicare Important Message Given:  Yes-third notification given Date Medicare IM Given:    Medicare IM give by:    Date Additional Medicare IM Given:    Additional Medicare Important Message give by:     If discussed at [place] of Stay Meetings, dates discussed:    Additional Comments:  Dessa Phi, RN 10/28/2014, 1:49 PM

## 2014-10-28 NOTE — Consult Note (Signed)
Name: Kelli Reyes MRN: [id number] DOB: July 20, 1945    ADMISSION DATE:  10/21/2014 CONSULTATION DATE:  10/28/2014  REFERRING MD :  Maryruth Bun  CHIEF COMPLAINT:  Persistent fever, lung cancer with postobstructive pneumonia  HISTORY OF PRESENT ILLNESS:  69 year old ex-smoker with history of lung cancer diagnosed in 03/2014 , status post chemotherapy at [facility] with disease progression. She came back to [facility] oncology  Alvy Bimler) and palliative radiation is being considered . she was admitted 7/28 for hypotension , she has a large left upper lobe mass and was treated for postobstructive pneumonia and UTI. She improved clinically but had persistent fever last 2 days. Hence [facility] M consulted to opine on pulmonary cause of fever.   she has a prior history of sarcoidosis and imaging studies have shown splenomegaly . she presented 11/2013 with left upper lobe mass , initial bronchoscopy was nondiagnostic .PET scan in 01/2014 showed hypermetabolic left upper lobe mass with hypermetabolic is him in the spleen and symmetric bilateral hilar lymphadenopathy . 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  STUDIES:  CT chest 10/27/2014 -left upper lobe mass is increased in size from 09/2014. There is extrinsic narrowing of the left upper lobe segmental bronchus, there is mild groundglass opacity surrounding the left upper lobe mass, no other evidence of metastatic disease     PAST MEDICAL HISTORY :   has a past medical history of COPD (chronic obstructive pulmonary disease); GERD (gastroesophageal reflux disease); Hypertension; Osteoarthritis; and Lung nodules.  has past surgical history that includes Abdominal hysterectomy; Video bronchoscopy (Bilateral, 12/24/2013); and Video bronchoscopy (Bilateral, 04/15/2014). Prior to Admission medications   Medication Sig Start Date End Date Taking? Authorizing Provider  albuterol  (PROVENTIL HFA;VENTOLIN HFA) 108 (90 BASE) MCG/ACT inhaler Inhale 2 puffs into the lungs every 6 (six) hours as needed for wheezing or shortness of breath.   Yes Historical Provider, MD  beclomethasone (QVAR) 80 MCG/ACT inhaler Inhale 2 puffs into the lungs 2 (two) times daily.   Yes Historical Provider, MD  docusate sodium (COLACE) 100 MG capsule Take 100 mg by mouth 2 (two) times daily.   Yes Historical Provider, MD  FeAsp-B12-FA-C-DSS-SuccAc-Zn (FERIVA 21/7) 75-1 MG TABS Take 1 tablet by mouth daily. 10/05/14  Yes Janith Lima, MD  ipratropium (ATROVENT HFA) 17 MCG/ACT inhaler Inhale 2 puffs into the lungs every 6 (six) hours as needed for wheezing.   Yes Historical Provider, MD  losartan-hydrochlorothiazide (HYZAAR) 50-12.5 MG per tablet Take 1 tablet by mouth every morning. 08/10/14  Yes Janith Lima, MD  MELATONIN PO Take 1 tablet by mouth at bedtime.    Yes Historical Provider, MD  mirtazapine (REMERON) 15 MG tablet Take 15 mg by mouth at bedtime.   Yes Historical Provider, MD  omeprazole (PRILOSEC) 20 MG capsule Take 20 mg by mouth every evening.   Yes Historical Provider, MD  oxyCODONE (OXY IR/ROXICODONE) 5 MG immediate release tablet Take 5 mg by mouth every 4 (four) hours as needed for severe pain.   Yes Historical Provider, MD  predniSONE (DELTASONE) 10 MG tablet Take 10 mg by mouth daily with breakfast.   Yes Historical Provider, MD  PRESCRIPTION MEDICATION    Yes Historical Provider, MD  prochlorperazine (COMPAZINE) 10 MG tablet Take 10 mg by mouth every 6 (six) hours as needed for nausea or vomiting.   Yes Historical Provider, MD   Allergies  Allergen Reactions  . Paclitaxel Anaphylaxis  Pt with severe reaction 4 minutes into first paclitaxel infusion.   . Ibuprofen     REACTION: rash    FAMILY HISTORY:  family history includes Hyperlipidemia in her other. There is no history of Cancer, Stroke, or Heart disease. SOCIAL HISTORY:  reports that she quit smoking about 18 years  ago. Her smoking use included Cigarettes. She has a 50 pack-year smoking history. She has never used smokeless tobacco. She reports that she does not drink alcohol or use illicit drugs.  REVIEW OF SYSTEMS:   Constitutional: Negative for  weight loss, malaise/fatigue and diaphoresis.  HENT: Negative for hearing loss, ear pain, nosebleeds, congestion, sore throat, neck pain, tinnitus and ear discharge.   Eyes: Negative for blurred vision, double vision, photophobia, pain, discharge and redness.  Respiratory: Negative for cough, hemoptysis, sputum production, shortness of breath, wheezing and stridor.   Cardiovascular: Negative for chest pain, palpitations, orthopnea, claudication, leg swelling and PND.  Gastrointestinal: Negative for heartburn, nausea, vomiting, abdominal pain, diarrhea, constipation, blood in stool and melena.  Genitourinary: Negative for dysuria, urgency, frequency, hematuria and flank pain.  Musculoskeletal: Negative for myalgias, back pain, joint pain and falls.  Skin: Negative for itching and rash.  Neurological: Negative for dizziness, tingling, tremors, sensory change, speech change, focal weakness, seizures, loss of consciousness, weakness and headaches.  Endo/Heme/Allergies: Negative for environmental allergies and polydipsia. Does not bruise/bleed easily.  SUBJECTIVE:   VITAL SIGNS: Temp:  [98.8 F (37.1 C)-101 F (38.3 C)] 98.8 F (37.1 C) (08/04 0612) Pulse Rate:  [106-116] 110 (08/04 1010) Resp:  [20-93] 20 (08/04 0425) BP: (115-132)/(64-85) 132/85 mmHg (08/04 0425) SpO2:  [95 %-100 %] 95 % (08/04 0748)  PHYSICAL EXAMINATION: Gen. Pleasant, well-nourished, in no distress, normal affect ENT - no lesions, no post nasal drip Neck: No JVD, no thyromegaly, no carotid bruits Lungs: no use of accessory muscles, no dullness to percussion, clear without rales or rhonchi  Cardiovascular: Rhythm regular, heart sounds  normal, no murmurs or gallops, no peripheral  edema Abdomen: soft and non-tender, no hepatosplenomegaly, BS normal. 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 Prior hysterectomy.  EXAM: CT CHEST, ABDOMEN AND PELVIS WITHOUT CONTRAST  TECHNIQUE: Multidetector CT imaging of the chest, abdomen and pelvis was performed following the standard protocol without IV contrast.  COMPARISON:  Most recent chest CT from 09/27/2014. PET-CT from 01/28/2014. CT abdomen/pelvis from 12/12/2013.  FINDINGS: CT CHEST FINDINGS  Images are motion degraded.  Mediastinum/Nodes: Normal heart size. No pericardial fluid/thickening. There is atherosclerosis of the thoracic aorta, the great vessels of the mediastinum and the coronary arteries, including calcified atherosclerotic plaque in the left anterior descending and left circumflex coronary arteries. Stable aortic annular calcification. Great vessels are normal in course and caliber. Normal visualized thyroid. Normal esophagus. No axillary lymphadenopathy. Numerous coarsely calcified  nonenlarged mediastinal and bilateral hilar lymph nodes are unchanged, and are in keeping with prior granulomatous disease. No new pathologically enlarged mediastinal nodes. No gross hilar lymphadenopathy, noting limited sensitivity for the detection of hilar adenopathy on a noncontrast study.  Lungs/Pleura: No pneumothorax. New trace  right and small left layering pleural effusions. Medial left upper lobe primary malignancy measures 9.1 x 6.0 cm (series 2/image 16), significantly increased from 5.3 x 4.3 cm on 09/27/2014, again demonstrating a broad base of pleural attachment to the mediastinal and anterior/apical left pleural surface, with extension of the tumor to the upper left hilum and associated worsening extrinsic narrowing of the associated left upper lobe segmental bronchus. There is increased mild patchy ground-glass opacity in the apical left upper lobe surrounding the malignant tumor. Right upper lobe 4 mm noncalcified pulmonary nodule (4/19) is unchanged since 06/19/2010. Nodular scarring in the anterior basilar right lower lobe is unchanged since 06/19/2010. Subpleural 6 mm right lower lobe pulmonary nodule (4/24) is unchanged since 06/19/2010. Subpleural nodularity in the superior left lower lobe is unchanged since 06/19/2010. Multiple subcentimeter calcified granulomas are again noted of both lungs. No new significant pulmonary nodules.  Musculoskeletal: Mild degenerative changes in the thoracic spine. No suspicious focal osseous lesions in the chest.  CT ABDOMEN AND PELVIS FINDINGS  Hepatobiliary: Normal liver, with no liver mass. Contracted and grossly normal gallbladder, with no radiopaque cholelithiasis. No biliary ductal dilatation.  Pancreas: Normal.  Spleen: Normal size spleen (craniocaudal splenic length 9.8 cm). No splenic mass.  Adrenals/Urinary Tract: Normal adrenals. Simple 4.1 cm renal cyst in the lateral upper right kidney. Otherwise normal kidneys, with no contour deforming renal mass, no hydronephrosis and no nephrolithiasis. Normal caliber ureters. Minimally distended and grossly normal urinary bladder.  Stomach/Bowel: Grossly normal stomach. Normal caliber small bowel. The appendix is not discretely visualized. There is severe sigmoid diverticulosis, with scattered diverticula throughout the remaining colon. No  colonic wall thickening or pericolonic fat stranding to suggest acute diverticulitis.  Vascular/Lymphatic: Atherosclerotic nonaneurysmal abdominal aorta. No new lymphadenopathy in the abdomen or pelvis. Stable coarsely calcified portacaval nodes from prior granulomatous disease.  Reproductive: Status post hysterectomy, with no abnormal findings at the vaginal cuff. No adnexal abnormality.  Other: No pneumoperitoneum, ascites or focal fluid collection.  Musculoskeletal: Stable chronic small bilateral fat containing inguinal hernias, left greater than right. Coarsely calcified bilateral gluteal subcutaneous granulomas. Nonspecific sclerotic lesion in the left iliac bone, unchanged since 12/12/2013. Mild degenerative changes in the lumbar spine. No new focal osseous lesions in the abdomen or pelvis.  IMPRESSION: 1. Interval progression of the medial left upper lobe primary malignancy, now 9.1 x 6.0 cm, with worsening extrinsic narrowing of the associated left upper lobe segmental bronchus. 2. Increased nonspecific mild patchy ground-glass opacity in the left upper lobe surrounding the neoplasm, which could represent mild postobstructive pneumonitis, peritumoral hemorrhage or small satellite tumor nodules. 3. New trace right and small left layering pleural effusions. 4. No evidence of metastatic disease in the chest, abdomen or pelvis on this noncontrast study. 5. Marked sigmoid diverticulosis, with no evidence of acute diverticulitis. No acute abnormality in the abdomen or pelvis. Several additional chronic findings as above.   Electronically Signed   By: Ilona Sorrel M.D.   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 Prior hysterectomy.  EXAM: CT  CHEST, ABDOMEN AND PELVIS WITHOUT CONTRAST  TECHNIQUE: Multidetector CT  imaging of the chest, abdomen and pelvis was performed following the standard protocol without IV contrast.  COMPARISON:  Most recent chest CT from 09/27/2014. PET-CT from 01/28/2014. CT abdomen/pelvis from 12/12/2013.  FINDINGS: CT CHEST FINDINGS  Images are motion degraded.  Mediastinum/Nodes: Normal heart size. No pericardial fluid/thickening. There is atherosclerosis of the thoracic aorta, the great vessels of the mediastinum and the coronary arteries, including calcified atherosclerotic plaque in the left anterior descending and left circumflex coronary arteries. Stable aortic annular calcification. Great vessels are normal in course and caliber. Normal visualized thyroid. Normal esophagus. No axillary lymphadenopathy. Numerous coarsely calcified nonenlarged mediastinal and bilateral hilar lymph nodes are unchanged, and are in keeping with prior granulomatous disease. No new pathologically enlarged mediastinal nodes. No gross hilar lymphadenopathy, noting limited sensitivity for the detection of hilar adenopathy on a noncontrast study.  Lungs/Pleura: No pneumothorax. New trace right and small left layering pleural effusions. Medial left upper lobe primary malignancy measures 9.1 x 6.0 cm (series 2/image 16), significantly increased from 5.3 x 4.3 cm on 09/27/2014, again demonstrating a broad base of pleural attachment to the mediastinal and anterior/apical left pleural surface, with extension of the tumor to the upper left hilum and associated worsening extrinsic narrowing of the associated left upper lobe segmental bronchus. There is increased mild patchy ground-glass opacity in the apical left upper lobe surrounding the malignant tumor. Right upper lobe 4 mm noncalcified pulmonary nodule (4/19) is unchanged since 06/19/2010. Nodular scarring in the anterior basilar right lower lobe is unchanged since 06/19/2010. Subpleural 6 mm right lower lobe pulmonary nodule (4/24) is unchanged since 06/19/2010. Subpleural  nodularity in the superior left lower lobe is unchanged since 06/19/2010. Multiple subcentimeter calcified granulomas are again noted of both lungs. No new significant pulmonary nodules.  Musculoskeletal: Mild degenerative changes in the thoracic spine. No suspicious focal osseous lesions in the chest.  CT ABDOMEN AND PELVIS FINDINGS  Hepatobiliary: Normal liver, with no liver mass. Contracted and grossly normal gallbladder, with no radiopaque cholelithiasis. No biliary ductal dilatation.  Pancreas: Normal.  Spleen: Normal size spleen (craniocaudal splenic length 9.8 cm). No splenic mass.  Adrenals/Urinary Tract: Normal adrenals. Simple 4.1 cm renal cyst in the lateral upper right kidney. Otherwise normal kidneys, with no contour deforming renal mass, no hydronephrosis and no nephrolithiasis. Normal caliber ureters. Minimally distended and grossly normal urinary bladder.  Stomach/Bowel: Grossly normal stomach. Normal caliber small bowel. The appendix is not discretely visualized. There is severe sigmoid diverticulosis, with scattered diverticula throughout the remaining colon. No colonic wall thickening or pericolonic fat stranding to suggest acute diverticulitis.  Vascular/Lymphatic: Atherosclerotic nonaneurysmal abdominal aorta. No new lymphadenopathy in the abdomen or pelvis. Stable coarsely calcified portacaval nodes from prior granulomatous disease.  Reproductive: Status post hysterectomy, with no abnormal findings at the vaginal cuff. No adnexal abnormality.  Other: No pneumoperitoneum, ascites or focal fluid collection.  Musculoskeletal: Stable chronic small bilateral fat containing inguinal hernias, left greater than right. Coarsely calcified bilateral gluteal subcutaneous granulomas. Nonspecific sclerotic lesion in the left iliac bone, unchanged since 12/12/2013. Mild degenerative changes in the lumbar spine. No new focal osseous lesions in the abdomen or pelvis.  IMPRESSION: 1. Interval progression of the  medial left upper lobe primary malignancy, now 9.1 x 6.0 cm, with worsening extrinsic narrowing of the associated left upper lobe segmental bronchus. 2. Increased nonspecific mild patchy ground-glass opacity in the left upper lobe surrounding the neoplasm, which could represent mild postobstructive  pneumonitis, peritumoral hemorrhage or small satellite tumor nodules. 3. New trace right and small left layering pleural effusions. 4. No evidence of metastatic disease in the chest, abdomen or pelvis on this noncontrast study. 5. Marked sigmoid diverticulosis, with no evidence of acute diverticulitis. No acute abnormality in the abdomen or pelvis. Several additional chronic findings as above.   Electronically Signed   By: Ilona Sorrel M.D.   On: 10/27/2014 17:14    ASSESSMENT / PLAN:  Lung cancer- Unclear staging -we'll have to review [facility] mediastinoscopy records to see if mediastinal lymph nodes were involved -if so this would at least be stage IIIB . Splenomegaly   underlying sarcoidosis   fever of unknown origin -the hypodensity within the left upper lobe mass is very indicative tumor necrosis and this may very well may be the cause of the fever   I'm not impressed by the degree of postobstructive pneumonia. Doubt that bronchoscopy would be of any benefit here. I'm not sure that more antibiotics are indicated Palliative radiation is being planned and I would not delay this due to the fever  Kara Mead MD. Shade Flood. [facility] Pager [phone] If no response call [phone]    10/28/2014, 11:53 AM

## 2014-10-29 ENCOUNTER — Encounter (HOSPITAL_COMMUNITY): Payer: Self-pay

## 2014-10-29 ENCOUNTER — Emergency Department (HOSPITAL_COMMUNITY): Payer: PPO

## 2014-10-29 ENCOUNTER — Telehealth: Payer: Self-pay | Admitting: *Deleted

## 2014-10-29 ENCOUNTER — Emergency Department (HOSPITAL_COMMUNITY)
Admission: EM | Admit: 2014-10-29 | Discharge: 2014-10-29 | Disposition: A | Discharge status: Home / Self Care | Payer: PPO | Attending: Emergency Medicine | Admitting: Emergency Medicine

## 2014-10-29 DIAGNOSIS — D500 Iron deficiency anemia secondary to blood loss (chronic): Secondary | ICD-10-CM | POA: Diagnosis not present

## 2014-10-29 DIAGNOSIS — R5081 Fever presenting with conditions classified elsewhere: Secondary | ICD-10-CM | POA: Diagnosis not present

## 2014-10-29 DIAGNOSIS — J441 Chronic obstructive pulmonary disease with (acute) exacerbation: Secondary | ICD-10-CM | POA: Insufficient documentation

## 2014-10-29 DIAGNOSIS — R0602 Shortness of breath: Secondary | ICD-10-CM | POA: Diagnosis present

## 2014-10-29 DIAGNOSIS — C3412 Malignant neoplasm of upper lobe, left bronchus or lung: Secondary | ICD-10-CM | POA: Diagnosis not present

## 2014-10-29 DIAGNOSIS — Z87891 Personal history of nicotine dependence: Secondary | ICD-10-CM | POA: Insufficient documentation

## 2014-10-29 DIAGNOSIS — K219 Gastro-esophageal reflux disease without esophagitis: Secondary | ICD-10-CM | POA: Insufficient documentation

## 2014-10-29 DIAGNOSIS — I10 Essential (primary) hypertension: Secondary | ICD-10-CM | POA: Insufficient documentation

## 2014-10-29 DIAGNOSIS — E876 Hypokalemia: Secondary | ICD-10-CM | POA: Diagnosis not present

## 2014-10-29 DIAGNOSIS — Z7952 Long term (current) use of systemic steroids: Secondary | ICD-10-CM | POA: Insufficient documentation

## 2014-10-29 DIAGNOSIS — Z79899 Other long term (current) drug therapy: Secondary | ICD-10-CM | POA: Insufficient documentation

## 2014-10-29 DIAGNOSIS — D649 Anemia, unspecified: Secondary | ICD-10-CM

## 2014-10-29 DIAGNOSIS — M1990 Unspecified osteoarthritis, unspecified site: Secondary | ICD-10-CM | POA: Insufficient documentation

## 2014-10-29 DIAGNOSIS — R509 Fever, unspecified: Secondary | ICD-10-CM

## 2014-10-29 LAB — CBC WITH DIFFERENTIAL/PLATELET
BASOS PCT: 0 % (ref 0–1)
Basophils Absolute: 0 10*3/uL (ref 0.0–0.1)
EOS ABS: 0.1 10*3/uL (ref 0.0–0.7)
Eosinophils Relative: 0 % (ref 0–5)
HCT: 29.2 % — ABNORMAL LOW (ref 36.0–46.0)
Hemoglobin: 8.9 g/dL — ABNORMAL LOW (ref 12.0–15.0)
LYMPHS ABS: 1.1 10*3/uL (ref 0.7–4.0)
Lymphocytes Relative: 6 % — ABNORMAL LOW (ref 12–46)
MCH: 27.9 pg (ref 26.0–34.0)
MCHC: 30.5 g/dL (ref 30.0–36.0)
MCV: 91.5 fL (ref 78.0–100.0)
MONO ABS: 1.1 10*3/uL — AB (ref 0.1–1.0)
Monocytes Relative: 6 % (ref 3–12)
Neutro Abs: 14.8 10*3/uL — ABNORMAL HIGH (ref 1.7–7.7)
Neutrophils Relative %: 88 % — ABNORMAL HIGH (ref 43–77)
Platelets: 370 10*3/uL (ref 150–400)
RBC: 3.19 MIL/uL — ABNORMAL LOW (ref 3.87–5.11)
RDW: 15.9 % — AB (ref 11.5–15.5)
WBC: 17.1 10*3/uL — ABNORMAL HIGH (ref 4.0–10.5)

## 2014-10-29 LAB — URINE MICROSCOPIC-ADD ON

## 2014-10-29 LAB — COMPREHENSIVE METABOLIC PANEL
ALBUMIN: 2.3 g/dL — AB (ref 3.5–5.0)
ALT: 10 U/L — AB (ref 14–54)
AST: 9 U/L — AB (ref 15–41)
Alkaline Phosphatase: 162 U/L — ABNORMAL HIGH (ref 38–126)
Anion gap: 9 (ref 5–15)
BUN: 9 mg/dL (ref 6–20)
CHLORIDE: 101 mmol/L (ref 101–111)
CO2: 25 mmol/L (ref 22–32)
Calcium: 9.3 mg/dL (ref 8.9–10.3)
Creatinine, Ser: 0.78 mg/dL (ref 0.44–1.00)
Glucose, Bld: 120 mg/dL — ABNORMAL HIGH (ref 65–99)
Potassium: 3.2 mmol/L — ABNORMAL LOW (ref 3.5–5.1)
Sodium: 135 mmol/L (ref 135–145)
TOTAL PROTEIN: 6.8 g/dL (ref 6.5–8.1)
Total Bilirubin: 0.9 mg/dL (ref 0.3–1.2)

## 2014-10-29 LAB — URINALYSIS, ROUTINE W REFLEX MICROSCOPIC
Bilirubin Urine: NEGATIVE
GLUCOSE, UA: NEGATIVE mg/dL
HGB URINE DIPSTICK: NEGATIVE
KETONES UR: 15 mg/dL — AB
Nitrite: NEGATIVE
Protein, ur: 30 mg/dL — AB
Specific Gravity, Urine: 1.016 (ref 1.005–1.030)
Urobilinogen, UA: 0.2 mg/dL (ref 0.0–1.0)
pH: 7 (ref 5.0–8.0)

## 2014-10-29 LAB — I-STAT CG4 LACTIC ACID, ED: Lactic Acid, Venous: 0.95 mmol/L (ref 0.5–2.0)

## 2014-10-29 LAB — BRAIN NATRIURETIC PEPTIDE: B Natriuretic Peptide: 143.3 pg/mL — ABNORMAL HIGH (ref 0.0–100.0)

## 2014-10-29 LAB — TROPONIN I: Troponin I: 0.03 ng/mL (ref <0.031)

## 2014-10-29 MED ORDER — IPRATROPIUM-ALBUTEROL 0.5-2.5 (3) MG/3ML IN SOLN
3.0000 mL | Freq: Once | RESPIRATORY_TRACT | Status: AC
  Administered 2014-10-29: 3 mL via RESPIRATORY_TRACT
  Filled 2014-10-29: qty 3

## 2014-10-29 MED ORDER — ALBUTEROL SULFATE HFA 108 (90 BASE) MCG/ACT IN AERS: 2.0000 | INHALATION_SPRAY | RESPIRATORY_TRACT | Status: AC | PRN

## 2014-10-29 MED ORDER — ACETAMINOPHEN 325 MG PO TABS
650.0000 mg | ORAL_TABLET | Freq: Once | ORAL | Status: AC
  Administered 2014-10-29: 650 mg via ORAL
  Filled 2014-10-29: qty 2

## 2014-10-29 MED ORDER — OXYCODONE HCL 5 MG PO TABS: 5.0000 mg | ORAL_TABLET | ORAL | Status: AC | PRN

## 2014-10-29 MED ORDER — POTASSIUM CHLORIDE CRYS ER 20 MEQ PO TBCR
40.0000 meq | EXTENDED_RELEASE_TABLET | Freq: Once | ORAL | Status: AC
  Administered 2014-10-29: 40 meq via ORAL
  Filled 2014-10-29: qty 2

## 2014-10-29 MED ORDER — POTASSIUM CHLORIDE CRYS ER 20 MEQ PO TBCR: 20.0000 meq | EXTENDED_RELEASE_TABLET | Freq: Two times a day (BID) | ORAL | Status: AC

## 2014-10-29 NOTE — ED Notes (Signed)
RN Jana Half notified- will look at patient's veins to try get saline lock/blood

## 2014-10-29 NOTE — ED Provider Notes (Signed)
MSE was initiated and I personally evaluated the patient and placed orders (if any) at  2:48 PM on October 29, 2014.  The patient appears stable so that the remainder of the MSE may be completed by another provider.    Jola Schmidt, MD 10/29/14 [phone]

## 2014-10-29 NOTE — ED Provider Notes (Signed)
CSN: [id number]     Arrival date & time 10/29/14  1413 History   First MD Initiated Contact with Patient 10/29/14 1440     Chief Complaint  Patient presents with  . Shortness of Breath  . Cough     (Consider location/radiation/quality/duration/timing/severity/associated sxs/prior Treatment) Patient is a 69 y.o. female presenting with shortness of breath and cough. The history is provided by the patient and a relative.  Shortness of Breath Associated symptoms: cough   Cough Associated symptoms: shortness of breath   69 year old fema.el with known lung cancer and discharged from the hospital yesterday comes in with increased dyspnea which started today. There is a nonproductive cough. No fever or chills. No pain. No nausea or vomiting.  Past Medical History  Diagnosis Date  . COPD (chronic obstructive pulmonary disease)   . GERD (gastroesophageal reflux disease)   . Hypertension   . Osteoarthritis   . Lung nodules    Past Surgical History  Procedure Laterality Date  . Abdominal hysterectomy    . Video bronchoscopy Bilateral 12/24/2013    Procedure: VIDEO BRONCHOSCOPY WITH FLUORO;  Surgeon: Tanda Rockers, MD;  Location: [facility];  Service: Cardiopulmonary;  Laterality: Bilateral;  . Video bronchoscopy Bilateral 04/15/2014    Procedure: VIDEO BRONCHOSCOPY WITH FLUORO;  Surgeon: Tanda Rockers, MD;  Location: [facility];  Service: Cardiopulmonary;  Laterality: Bilateral;   Family History  Problem Relation Age of Onset  . Hyperlipidemia Other   . Cancer Neg Hx   . Stroke Neg Hx   . Heart disease Neg Hx    History  Substance Use Topics  . Smoking status: Former Smoker -- 2.00 packs/day for 25 years    Types: Cigarettes    Quit date: 09/03/1996  . Smokeless tobacco: Never Used  . Alcohol Use: No     Comment: beer 2-3 times a week   OB History    No data available     Review of Systems  Respiratory: Positive for cough and shortness of breath.   All other systems  reviewed and are negative.     Allergies  Paclitaxel and Ibuprofen  Home Medications   Prior to Admission medications   Medication Sig Start Date End Date Taking? Authorizing Provider  blood glucose meter kit and supplies KIT Dispense based on patient and insurance preference. Use up to four times daily as directed. (FOR ICD-9 250.00, 250.01). 10/28/14  Yes Florencia Reasons, MD  docusate sodium (COLACE) 100 MG capsule Take 100 mg by mouth 2 (two) times daily.   Yes Historical Provider, MD  FeAsp-B12-[id number]-C-DSS-SuccAc-Zn (FERIVA 21/7) 75-1 MG TABS Take 1 tablet by mouth daily. 10/05/14  Yes Janith Lima, MD  glucose blood test strip Use as instructed 10/28/14  Yes Florencia Reasons, MD  losartan (COZAAR) 25 MG tablet Take 1 tablet (25 mg total) by mouth daily. 10/28/14  Yes Florencia Reasons, MD  MELATONIN PO Take 1 tablet by mouth at bedtime.    Yes Historical Provider, MD  metFORMIN (GLUCOPHAGE) 500 MG tablet Take 1 tablet (500 mg total) by mouth daily with breakfast. 10/28/14  Yes Florencia Reasons, MD  metoprolol tartrate (LOPRESSOR) 25 MG tablet Take 0.5 tablets (12.5 mg total) by mouth 2 (two) times daily. 10/28/14  Yes Florencia Reasons, MD  mirtazapine (REMERON) 15 MG tablet Take 15 mg by mouth at bedtime.   Yes Historical Provider, MD  omeprazole (PRILOSEC) 20 MG capsule Take 20 mg by mouth every evening.   Yes Historical Provider, MD  oxyCODONE (OXY IR/ROXICODONE) 5 MG immediate release tablet Take 5 mg by mouth every 4 (four) hours as needed for severe pain.   Yes Historical Provider, MD  predniSONE (DELTASONE) 10 MG tablet Take 10 mg by mouth daily with breakfast.   Yes Historical Provider, MD  prochlorperazine (COMPAZINE) 10 MG tablet Take 10 mg by mouth every 6 (six) hours as needed for nausea or vomiting.   Yes Historical Provider, MD   BP 136/87 mmHg  Pulse 123  Temp(Src) 99.4 F (37.4 C) (Oral)  Resp 30  Ht 5' 4"  (1.626 m)  Wt 142 lb (64.411 kg)  BMI 24.36 kg/m2  SpO2 100% Physical Exam  Nursing note and vitals  reviewed.  69 year old female, resting comfortably and in no acute distress. Vital signs are significant for tachycardia and tachypnea. Oxygen saturation is 100%, which is normal. Head is normocephalic and atraumatic. PERRLA, EOMI. Oropharynx is clear. Neck is nontender and supple without adenopathy or JVD. Back is nontender and there is no CVA tenderness. Lungs have localized wheezing in the right upper lung field anteriorly. No rales or rhonchi. Chest is nontender. Heart has regular rate and rhythm without murmur. Abdomen is soft, flat, nontender without masses or hepatosplenomegaly and peristalsis is normoactive. Extremities have 1+ edema, full range of motion is present. Skin is warm and dry without rash. Neurologic: Mental status is normal, cranial nerves are intact, there are no motor or sensory deficits.  ED Course  Procedures (including critical care time) Labs Review Results for orders placed or performed during the hospital encounter of 10/29/14  CBC with Differential/Platelet  Result Value Ref Range   WBC 17.1 (H) 4.0 - 10.5 K/uL   RBC 3.19 (L) 3.87 - 5.11 MIL/uL   Hemoglobin 8.9 (L) 12.0 - 15.0 g/dL   HCT 29.2 (L) 36.0 - 46.0 %   MCV 91.5 78.0 - 100.0 fL   MCH 27.9 26.0 - 34.0 pg   MCHC 30.5 30.0 - 36.0 g/dL   RDW 15.9 (H) 11.5 - 15.5 %   Platelets 370 150 - 400 K/uL   Neutrophils Relative % 88 (H) 43 - 77 %   Neutro Abs 14.8 (H) 1.7 - 7.7 K/uL   Lymphocytes Relative 6 (L) 12 - 46 %   Lymphs Abs 1.1 0.7 - 4.0 K/uL   Monocytes Relative 6 3 - 12 %   Monocytes Absolute 1.1 (H) 0.1 - 1.0 K/uL   Eosinophils Relative 0 0 - 5 %   Eosinophils Absolute 0.1 0.0 - 0.7 K/uL   Basophils Relative 0 0 - 1 %   Basophils Absolute 0.0 0.0 - 0.1 K/uL  Comprehensive metabolic panel  Result Value Ref Range   Sodium 135 135 - 145 mmol/L   Potassium 3.2 (L) 3.5 - 5.1 mmol/L   Chloride 101 101 - 111 mmol/L   CO2 25 22 - 32 mmol/L   Glucose, Bld 120 (H) 65 - 99 mg/dL   BUN 9 6 - 20  mg/dL   Creatinine, Ser 0.78 0.44 - 1.00 mg/dL   Calcium 9.3 8.9 - 10.3 mg/dL   Total Protein 6.8 6.5 - 8.1 g/dL   Albumin 2.3 (L) 3.5 - 5.0 g/dL   AST 9 (L) 15 - 41 U/L   ALT 10 (L) 14 - 54 U/L   Alkaline Phosphatase 162 (H) 38 - 126 U/L   Total Bilirubin 0.9 0.3 - 1.2 mg/dL   GFR calc non Af Amer >60 >60 mL/min   GFR calc Af  Amer >60 >60 mL/min   Anion gap 9 5 - 15  Troponin I  Result Value Ref Range   Troponin I 0.03 <0.031 ng/mL  Brain natriuretic peptide  Result Value Ref Range   B Natriuretic Peptide 143.3 (H) 0.0 - 100.0 pg/mL  Urinalysis, Routine w reflex microscopic (not at [facility])  Result Value Ref Range   Color, Urine AMBER (A) YELLOW   APPearance CLOUDY (A) CLEAR   Specific Gravity, Urine 1.016 1.005 - 1.030   pH 7.0 5.0 - 8.0   Glucose, UA NEGATIVE NEGATIVE mg/dL   Hgb urine dipstick NEGATIVE NEGATIVE   Bilirubin Urine NEGATIVE NEGATIVE   Ketones, ur 15 (A) NEGATIVE mg/dL   Protein, ur 30 (A) NEGATIVE mg/dL   Urobilinogen, UA 0.2 0.0 - 1.0 mg/dL   Nitrite NEGATIVE NEGATIVE   Leukocytes, UA TRACE (A) NEGATIVE  Urine microscopic-add on  Result Value Ref Range   Squamous Epithelial / LPF MANY (A) RARE   WBC, UA 3-6 <3 WBC/hpf   RBC / HPF 0-2 <3 RBC/hpf   Urine-Other FEW YEAST   I-Stat CG4 Lactic Acid, ED  Result Value Ref Range   Lactic Acid, Venous 0.95 0.5 - 2.0 mmol/L   Imaging Review Dg Chest 2 View  10/29/2014   CLINICAL DATA:  Short of breath, cough.  EXAM: CHEST  2 VIEW  COMPARISON:  CT thorax 10/27/2014  FINDINGS: Large LEFT upper lobe mass is again demonstrated measuring 12 cm. Mild nodularity in the LEFT lower lobe is again demonstrated. No pneumothorax or pulmonary edema.  IMPRESSION: Large LEFT upper lobe mass. See CT description of 10/27/2014. No acute findings.   Electronically Signed   By: Suzy Bouchard M.D.   On: 10/29/2014 17:14   Images viewed by me.   EKG Interpretation   Date/Time:  Friday October 29 2014 14:34:18 EDT Ventricular Rate:   128 PR Interval:  69 QRS Duration: 62 QT Interval:  298 QTC Calculation: 435 R Axis:   77 Text Interpretation:  Sinus tachycardia Otherwise within normal limits  When compared with ECG of 10/24/2014, Premature ventricular complexes are  no longer Present Confirmed by [facility]  MD, Aris Even ([id number]) on 10/29/2014  3:14:47 PM      MDM   Final diagnoses:  Shortness of breath  Other specified fever  Hypokalemia  Normocytic normochromic anemia  Primary cancer of left upper lobe of lung    Subjective dyspnea with only mild, localized wheezing on exam. Cancer is on the opposite side of the wheezing. I suspect her dyspnea is related to her underlying malignancy. Just discharged from the hospital yesterday - will repeat ches x-ray. Screening labs obtained. Old records reviewed, discharged yesterday after admission for possible sepsis, fever felt to be due to tumor necrosis.  She feels significantly better after nebulizer treatment. Chest x-ray is unchanged and laboratory workup is little changed from baseline. There is been a slight drop in hemoglobin. Mild hypokalemia is noted she is given a dose of oral potassium. She states she has only 1 oxycodone tablet (home and she does not see her oncologist for another 3 days and is requesting a prescription to last her until then. She was getting weighed be discharged when the temperature spike to 102.9. While in the hospital, fever was felt to be due to tumor necrosis and there is no obvious source of infection noted. However, will check urinalysis prior to discharge. Blood cultures will also be obtained.  Urinalysis is unremarkable and she is discharged with prescriptions  for oxycodone and albuterol inhaler and K-Dur.  Delora Fuel, MD 09/32/67 1245

## 2014-10-29 NOTE — ED Notes (Addendum)
Patient was discharged from the hospital yesterday. Patient presents today with SOB and wheezing. Patient also has a non productive cough. Patient has a history of lung cancer. Patient is lethargic.

## 2014-10-29 NOTE — Telephone Encounter (Signed)
Transition Care Management Follow-up Telephone Call   Date discharged? 10/28/14   How have you been since you were released from the hospital? Pt states she is doing ok have had better days   Do you understand why you were in the hospital? YES   Do you understand the discharge instructions? YES   Where were you discharged to? Home   Items Reviewed:  Medications reviewed: YES  Allergies reviewed: YES  Dietary changes reviewed: YES carb modified  Referrals reviewed: No referral needed   Functional Questionnaire:   Activities of Daily Living (ADLs):   She states she are independent in the following: ambulation, bathing and hygiene, feeding, continence, grooming, toileting and dressing States she require assistance with the following: ambulation and dressing sometimes   Any transportation issues/concerns?: NO   Any patient concerns? NO   Confirmed importance and date/time of follow-up visits scheduled YES, pt already made appt for 11/15/14  Provider Appointment booked with Dr. Ronnald Ramp  Confirmed with patient if condition begins to worsen call PCP or go to the ER.  Patient was given the office number and encouraged to call back with question or concerns.  : YES

## 2014-10-29 NOTE — ED Notes (Signed)
RN has called main lab for labs

## 2014-10-29 NOTE — Discharge Instructions (Signed)
Use the inhjaler every four hours as needed.  Bronchospasm A bronchospasm is a spasm or tightening of the airways going into the lungs. During a bronchospasm breathing becomes more difficult because the airways get smaller. When this happens there can be coughing, a whistling sound when breathing (wheezing), and difficulty breathing. Bronchospasm is often associated with asthma, but not all patients who experience a bronchospasm have asthma. CAUSES  A bronchospasm is caused by inflammation or irritation of the airways. The inflammation or irritation may be triggered by:   Allergies (such as to animals, pollen, food, or mold). Allergens that cause bronchospasm may cause wheezing immediately after exposure or many hours later.   Infection. Viral infections are believed to be the most common cause of bronchospasm.   Exercise.   Irritants (such as pollution, cigarette smoke, strong odors, aerosol sprays, and paint fumes).   Weather changes. Winds increase molds and pollens in the air. Rain refreshes the air by washing irritants out. Cold air may cause inflammation.   Stress and emotional upset.  SIGNS AND SYMPTOMS   Wheezing.   Excessive nighttime coughing.   Frequent or severe coughing with a simple cold.   Chest tightness.   Shortness of breath.  DIAGNOSIS  Bronchospasm is usually diagnosed through a history and physical exam. Tests, such as chest X-rays, are sometimes done to look for other conditions. TREATMENT   Inhaled medicines can be given to open up your airways and help you breathe. The medicines can be given using either an inhaler or a nebulizer machine.  Corticosteroid medicines may be given for severe bronchospasm, usually when it is associated with asthma. HOME CARE INSTRUCTIONS   Always have a plan prepared for seeking medical care. Know when to call your health care provider and local emergency services (911 in the [place] Know where you can access local  emergency care.  Only take medicines as directed by your health care provider.  If you were prescribed an inhaler or nebulizer machine, ask your health care provider to explain how to use it correctly. Always use a spacer with your inhaler if you were given one.  It is necessary to remain calm during an attack. Try to relax and breathe more slowly.  Control your home environment in the following ways:   Change your heating and air conditioning filter at least once a month.   Limit your use of fireplaces and wood stoves.  Do not smoke and do not allow smoking in your home.   Avoid exposure to perfumes and fragrances.   Get rid of pests (such as roaches and mice) and their droppings.   Throw away plants if you see mold on them.   Keep your house clean and dust free.   Replace carpet with wood, tile, or vinyl flooring. Carpet can trap dander and dust.   Use allergy-proof pillows, mattress covers, and box spring covers.   Wash bed sheets and blankets every week in hot water and dry them in a dryer.   Use blankets that are made of polyester or cotton.   Wash hands frequently. SEEK MEDICAL CARE IF:   You have muscle aches.   You have chest pain.   The sputum changes from clear or white to yellow, green, gray, or bloody.   The sputum you cough up gets thicker.   There are problems that may be related to the medicine you are given, such as a rash, itching, swelling, or trouble breathing.  SEEK IMMEDIATE MEDICAL CARE IF:  You have worsening wheezing and coughing even after taking your prescribed medicines.   You have increased difficulty breathing.   You develop severe chest pain. MAKE SURE YOU:   Understand these instructions.  Will watch your condition.  Will get help right away if you are not doing well or get worse. Document Released: 03/15/2003 Document Revised: 03/17/2013 Document Reviewed: 09/01/2012 [facility] Patient Information 2015  [place], [place]. This information is not intended to replace advice given to you by your health care provider. Make sure you discuss any questions you have with your health care provider.  Albuterol inhalation aerosol What is this medicine? ALBUTEROL (al Norm[place]ean [place]) is a bronchodilator. It helps open up the airways in your lungs to make it easier to breathe. This medicine is used to treat and to prevent bronchospasm. This medicine may be used for other purposes; ask your health care provider or pharmacist if you have questions. COMMON BRAND NAME(S): Proair HFA, Proventil, Proventil HFA, Respirol, Ventolin, Ventolin HFA What should I tell my health care provider before I take this medicine? They need to know if you have any of the following conditions: -diabetes -heart disease or irregular heartbeat -high blood pressure -pheochromocytoma -seizures -thyroid disease -an unusual or allergic reaction to albuterol, levalbuterol, sulfites, other medicines, foods, dyes, or preservatives -pregnant or trying to get pregnant -breast-feeding How should I use this medicine? This medicine is for inhalation through the mouth. Follow the directions on your prescription label. Take your medicine at regular intervals. Do not use more often than directed. Make sure that you are using your inhaler correctly. Ask you doctor or health care provider if you have any questions. Talk to your pediatrician regarding the use of this medicine in children. Special care may be needed. Overdosage: If you think you have taken too much of this medicine contact a poison control center or emergency room at once. NOTE: This medicine is only for you. Do not share this medicine with others. What if I miss a dose? If you miss a dose, use it as soon as you can. If it is almost time for your next dose, use only that dose. Do not use double or extra doses. What may interact with this medicine? -anti-infectives like chloroquine and  pentamidine -caffeine -cisapride -diuretics -medicines for colds -medicines for depression or for emotional or psychotic conditions -medicines for weight loss including some herbal products -methadone -some antibiotics like clarithromycin, erythromycin, levofloxacin, and linezolid -some heart medicines -steroid hormones like dexamethasone, cortisone, hydrocortisone -theophylline -thyroid hormones This list may not describe all possible interactions. Give your health care provider a list of all the medicines, herbs, non-prescription drugs, or dietary supplements you use. Also tell them if you smoke, drink alcohol, or use illegal drugs. Some items may interact with your medicine. What should I watch for while using this medicine? Tell your doctor or health care professional if your symptoms do not improve. Do not use extra albuterol. If your asthma or bronchitis gets worse while you are using this medicine, call your doctor right away. If your mouth gets dry try chewing sugarless gum or sucking hard candy. Drink water as directed. What side effects may I notice from receiving this medicine? Side effects that you should report to your doctor or health care professional as soon as possible: -allergic reactions like skin rash, itching or hives, swelling of the face, lips, or tongue -breathing problems -chest pain -feeling faint or lightheaded, falls -high blood pressure -irregular heartbeat -fever -muscle cramps  or weakness -pain, tingling, numbness in the hands or feet -vomiting Side effects that usually do not require medical attention (report to your doctor or health care professional if they continue or are bothersome): -cough -difficulty sleeping -headache -nervousness or trembling -stomach upset -stuffy or runny nose -throat irritation -unusual taste This list may not describe all possible side effects. Call your doctor for medical advice about side effects. You may report side  effects to FDA at 1-800-FDA-1088. Where should I keep my medicine? Keep out of the reach of children. Store at room temperature between 15 and 30 degrees C (59 and 86 degrees F). The contents are under pressure and may burst when exposed to heat or flame. Do not freeze. This medicine does not work as well if it is too cold. Throw away any unused medicine after the expiration date. Inhalers need to be thrown away after the labeled number of puffs have been used or by the expiration date; whichever comes first. Ventolin HFA should be thrown away 12 months after removing from foil pouch. Check the instructions that come with your medicine. NOTE: This sheet is a summary. It may not cover all possible information. If you have questions about this medicine, talk to your doctor, pharmacist, or health care provider.  2015, Elsevier/Gold Standard. (2012-08-28 10:57:17)  Hypokalemia Hypokalemia means that the amount of potassium in the blood is lower than normal.Potassium is a chemical, called an electrolyte, that helps regulate the amount of fluid in the body. It also stimulates muscle contraction and helps nerves function properly.Most of the body's potassium is inside of cells, and only a very small amount is in the blood. Because the amount in the blood is so small, minor changes can be life-threatening. CAUSES  Antibiotics.  Diarrhea or vomiting.  Using laxatives too much, which can cause diarrhea.  Chronic kidney disease.  Water pills (diuretics).  Eating disorders (bulimia).  Low magnesium level.  Sweating a lot. SIGNS AND SYMPTOMS  Weakness.  Constipation.  Fatigue.  Muscle cramps.  Mental confusion.  Skipped heartbeats or irregular heartbeat (palpitations).  Tingling or numbness. DIAGNOSIS  Your health care provider can diagnose hypokalemia with blood tests. In addition to checking your potassium level, your health care provider may also check other lab  tests. TREATMENT Hypokalemia can be treated with potassium supplements taken by mouth or adjustments in your current medicines. If your potassium level is very low, you may need to get potassium through a vein (IV) and be monitored in the hospital. A diet high in potassium is also helpful. Foods high in potassium are:  Nuts, such as peanuts and pistachios.  Seeds, such as sunflower seeds and pumpkin seeds.  Peas, lentils, and lima beans.  Whole grain and bran cereals and breads.  Fresh fruit and vegetables, such as apricots, avocado, bananas, cantaloupe, kiwi, oranges, tomatoes, asparagus, and potatoes.  Orange and tomato juices.  Red meats.  Fruit yogurt. HOME CARE INSTRUCTIONS  Take all medicines as prescribed by your health care provider.  Maintain a healthy diet by including nutritious food, such as fruits, vegetables, nuts, whole grains, and lean meats.  If you are taking a laxative, be sure to follow the directions on the label. SEEK MEDICAL CARE IF:  Your weakness gets worse.  You feel your heart pounding or racing.  You are vomiting or having diarrhea.  You are diabetic and having trouble keeping your blood glucose in the normal range. SEEK IMMEDIATE MEDICAL CARE IF:  You have chest pain, shortness  of breath, or dizziness.  You are vomiting or having diarrhea for more than 2 days.  You faint. MAKE SURE YOU:   Understand these instructions.  Will watch your condition.  Will get help right away if you are not doing well or get worse. Document Released: 03/12/2005 Document Revised: 12/31/2012 Document Reviewed: 09/12/2012 [facility] Patient Information 2015 [place], [place]. This information is not intended to replace advice given to you by your health care provider. Make sure you discuss any questions you have with your health care provider.  Potassium Salts tablets, extended-release tablets or capsules What is this medicine? POTASSIUM (poe TASS i um) is a  natural salt that is important for the heart, muscles, and nerves. It is found in many foods and is normally supplied by a well balanced diet. This medicine is used to treat low potassium. This medicine may be used for other purposes; ask your health care provider or pharmacist if you have questions. COMMON BRAND NAME(S): ED-K+10, Glu-K, K-10, K-8, K-Dur, K-Tab, Kaon-CL, Klor-Con, Klor-Con M10, Klor-Con M15, Klor-Con M20, Klotrix, Micro-K, Micro-K Extencaps, Slow-K What should I tell my health care provider before I take this medicine? They need to know if you have any of these conditions: -dehydration -diabetes -irregular heartbeat -kidney disease -stomach ulcers or other stomach problems -an unusual or allergic reaction to potassium salts, other medicines, foods, dyes, or preservatives -pregnant or trying to get pregnant -breast-feeding How should I use this medicine? Take this medicine by mouth with a full glass of water. Follow the directions on the prescription label. Take with food. Do not suck on, crush, or chew this medicine. If you have difficulty swallowing, ask the pharmacist how to take. Take your medicine at regular intervals. Do not take it more often than directed. Do not stop taking except on your doctor's advice. Talk to your pediatrician regarding the use of this medicine in children. Special care may be needed. Overdosage: If you think you have taken too much of this medicine contact a poison control center or emergency room at once. NOTE: This medicine is only for you. Do not share this medicine with others. What if I miss a dose? If you miss a dose, take it as soon as you can. If it is almost time for your next dose, take only that dose. Do not take double or extra doses. What may interact with this medicine? Do not take this medicine with any of the following medications: -eplerenone -sodium polystyrene sulfonate This medicine may also interact with the following  medications: -medicines for blood pressure or heart disease like lisinopril, losartan, quinapril, valsartan -medicines for cold or allergies -medicines for inflammation like ibuprofen, indomethacin -medicines for Parkinson's disease -medicines for the stomach like metoclopramide, dicyclomine, glycopyrrolate -some diuretics This list may not describe all possible interactions. Give your health care provider a list of all the medicines, herbs, non-prescription drugs, or dietary supplements you use. Also tell them if you smoke, drink alcohol, or use illegal drugs. Some items may interact with your medicine. What should I watch for while using this medicine? Visit your doctor or health care professional for regular check ups. You will need lab work done regularly. You may need to be on a special diet while taking this medicine. Ask your doctor. What side effects may I notice from receiving this medicine? Side effects that you should report to your doctor or health care professional as soon as possible: -allergic reactions like skin rash, itching or hives, swelling of the face,  lips, or tongue -black, tarry stools -heartburn -irregular heartbeat -numbness or tingling in hands or feet -pain when swallowing -unusually weak or tired Side effects that usually do not require medical attention (report to your doctor or health care professional if they continue or are bothersome): -diarrhea -nausea -stomach gas -vomiting This list may not describe all possible side effects. Call your doctor for medical advice about side effects. You may report side effects to FDA at 1-800-FDA-1088. Where should I keep my medicine? Keep out of the reach of children. Store at room temperature between 15 and 30 degrees C (59 and 86 degrees F ). Keep bottle closed tightly to protect this medicine from light and moisture. Throw away any unused medicine after the expiration date. NOTE: This sheet is a summary. It may not  cover all possible information. If you have questions about this medicine, talk to your doctor, pharmacist, or health care provider.  2015, Elsevier/Gold Standard. (2007-05-28 11:17:31)

## 2014-10-29 NOTE — ED Notes (Signed)
I was unable to collect all the blood on patient

## 2014-10-31 LAB — URINE CULTURE

## 2014-11-01 ENCOUNTER — Telehealth: Payer: Self-pay | Admitting: Hematology and Oncology

## 2014-11-01 ENCOUNTER — Telehealth: Payer: Self-pay | Admitting: *Deleted

## 2014-11-01 ENCOUNTER — Encounter: Payer: Self-pay | Admitting: Hematology and Oncology

## 2014-11-01 ENCOUNTER — Ambulatory Visit (HOSPITAL_BASED_OUTPATIENT_CLINIC_OR_DEPARTMENT_OTHER): Payer: PPO | Admitting: Hematology and Oncology

## 2014-11-01 ENCOUNTER — Telehealth: Payer: Self-pay | Admitting: Internal Medicine

## 2014-11-01 VITALS — BP 97/70 | HR 86 | Temp 98.1°F | Resp 18 | Ht 64.0 in | Wt 138.2 lb

## 2014-11-01 DIAGNOSIS — G894 Chronic pain syndrome: Secondary | ICD-10-CM

## 2014-11-01 DIAGNOSIS — C3412 Malignant neoplasm of upper lobe, left bronchus or lung: Secondary | ICD-10-CM

## 2014-11-01 DIAGNOSIS — I519 Heart disease, unspecified: Secondary | ICD-10-CM

## 2014-11-01 DIAGNOSIS — J449 Chronic obstructive pulmonary disease, unspecified: Secondary | ICD-10-CM

## 2014-11-01 LAB — CULTURE, BLOOD (ROUTINE X 2)
CULTURE: NO GROWTH
Culture: NO GROWTH

## 2014-11-01 MED ORDER — MIRTAZAPINE 15 MG PO TABS: 15.0000 mg | ORAL_TABLET | Freq: Every day | ORAL | Status: AC

## 2014-11-01 MED ORDER — MORPHINE SULFATE 15 MG PO TABS: 15.0000 mg | ORAL_TABLET | ORAL | Status: AC | PRN

## 2014-11-01 NOTE — Assessment & Plan Note (Signed)
I recommend Remeron for cancer cachexia.

## 2014-11-01 NOTE — Telephone Encounter (Signed)
Returned call and provided verbal orders to [facility]. Thanks

## 2014-11-01 NOTE — Telephone Encounter (Signed)
Gave and printed appt sched and avs fo rpt for OCT °

## 2014-11-01 NOTE — Telephone Encounter (Signed)
yes

## 2014-11-01 NOTE — Telephone Encounter (Signed)
Kelli Reyes from [facility] [phone] Doctor from ER only sent orders for PT She is requesting verbal authorization for: Home health aid OT Skilled nursing - she'll need some education on her glucose monitor.

## 2014-11-01 NOTE — Assessment & Plan Note (Signed)
She has chronic pain syndrome and oxycodone is not touching her pain. I will increase her pain regimen to IR morphine & I recommended hospice to follow and manage pain from home. She agreed with the plan of care.

## 2014-11-01 NOTE — Assessment & Plan Note (Signed)
She had poor lung function with chronic COPD. I recommend hospice referral and oxygen at home.

## 2014-11-01 NOTE — Assessment & Plan Note (Addendum)
Clinically, the patient appears to be very debilitated with progressive decline in performance status. I do not believe she would be a candidate for concurrent chemoradiation therapy. Even though she has been off chemotherapy treatment for almost 2 months, the patient was recently hospitalized for sepsis. I recommend palliative radiation treatment only. Frankly, I recommend referral to hospice care due to significant needs at home and poor performance status. The patient and her son agree with the plan of care. I have tentatively make her an appointment to see her back in 2 months for further follow-up but if she continues to decline clinically, she has the option of canceling that appointment in the future.

## 2014-11-01 NOTE — Telephone Encounter (Signed)
Hospice home care referral made to [facility].  S/w [facility] and asked them to contact pt's son Mikailah Morel on cell phone number for appointment.

## 2014-11-01 NOTE — Progress Notes (Signed)
[facility] OFFICE PROGRESS NOTE  Patient Care Team: Janith Lima, MD as PCP - General  SUMMARY OF ONCOLOGIC HISTORY:   Primary cancer of left upper lobe of lung   12/09/2013 Imaging CT scan of the scan showed abnormal soft tissue mass in the left upper lobe extending to the left hilum which is worrisome for primary pulmonarymalignancy, with stable findings consistent with previous granulomatous inflammation.   12/24/2013 Procedure She underwent bronchoscopy which showed no endoscopic lesion. Bronchoalveolar lavage was nondiagnostic.   01/28/2014 Imaging PET/CT scan showedLarge left upper lobe paramediastinal intensely hypermetabolic mass coupled with hypermetabolic spleen and symmetric hypermetabolichilar adenopathy. This is in a typical pattern for bronchogeniccarcinoma. 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   04/15/2014 Procedure Repeat bronchoscopy showed  Airways normal except for Mild cobblestoning bilaterally, conc narrowing AP segment LUL      07/02/2014 - 08/27/2014 Chemotherapy She received 3 cycles of chemotherapy [facility] combination gemcitabine and carboplatin. Original plan would be to give her carboplatin and Taxol but Taxol was discontinued due to severe allergic reaction within several minutes of treatment   07/05/2014 Imaging Necrotic appearing approximately 64 mm mass in the left upper lobe with broad mediastinal contact   09/22/2014 Imaging Repeat CT scan of the chest at [facility] show improved disease control   09/27/2014 Imaging CT chest showed central left upper lobe lung mass measures 5.3 x 4.3 cm versus 6.4 x 5.7 cm    10/21/2014 - 10/28/2014 Hospital Admission She was admitted to the hospital for sepsis secondary to urinary tract infection   10/27/2014 Imaging CT chest showed central left upper lobe lung mass measures 9.1 x 6.0 cm    10/29/2014 Miscellaneous She  presented to the ER with fever.    INTERVAL HISTORY: Please see below for problem oriented charting. She returns for further follow-up. She have persistent low-grade fever at home but denies cough. She complained of poorly controlled pain. She rated her pain at 6 out of 10 pain and recent prescription oxycodone did not treat her pain well. She is very weak with poor appetite and poor taste perception. Her son noticed that she eats very little and spent most of the time sitting or lying in bed at home. She have shortness of breath on minimal exertion.  REVIEW OF SYSTEMS:   Eyes: Denies blurriness of vision Ears, nose, mouth, throat, and face: Denies mucositis or sore throat Cardiovascular: Denies palpitation, chest discomfort or lower extremity swelling Gastrointestinal:  Denies nausea, heartburn or change in bowel habits Skin: Denies abnormal skin rashes Lymphatics: Denies new lymphadenopathy or easy bruising Neurological:Denies numbness, tingling Behavioral/Psych: Mood is stable, no new changes  All other systems were reviewed with the patient and are negative.  I have reviewed the past medical history, past surgical history, social history and family history with the patient and they are unchanged from previous note.  ALLERGIES:  is allergic to paclitaxel and ibuprofen.  MEDICATIONS:  Current Outpatient Prescriptions  Medication Sig Dispense Refill  . albuterol (PROVENTIL HFA;VENTOLIN HFA) 108 (90 BASE) MCG/ACT inhaler Inhale 2 puffs into the lungs every 4 (four) hours as needed for wheezing or shortness of breath (or coughing). 1 Inhaler 0  . blood glucose meter kit and supplies KIT Dispense based on patient and insurance preference. Use up to four times daily as directed. (FOR ICD-9 250.00, 250.01). 1 each 0  . docusate sodium (COLACE) 100 MG  capsule Take 100 mg by mouth 2 (two) times daily.    . FeAsp-B12-FA-C-DSS-SuccAc-Zn (FERIVA 21/7) 75-1 MG TABS Take 1 tablet by mouth  daily. 28 tablet 11  . glucose blood test strip Use as instructed 100 each 12  . losartan (COZAAR) 25 MG tablet Take 1 tablet (25 mg total) by mouth daily. 30 tablet 0  . MELATONIN PO Take 1 tablet by mouth at bedtime.     . metFORMIN (GLUCOPHAGE) 500 MG tablet Take 1 tablet (500 mg total) by mouth daily with breakfast. 30 tablet 0  . metoprolol tartrate (LOPRESSOR) 25 MG tablet Take 0.5 tablets (12.5 mg total) by mouth 2 (two) times daily. 60 tablet 0  . mirtazapine (REMERON) 15 MG tablet Take 1 tablet (15 mg total) by mouth at bedtime. 30 tablet 3  . morphine (MSIR) 15 MG tablet Take 1 tablet (15 mg total) by mouth every 4 (four) hours as needed for severe pain. 60 tablet 0  . omeprazole (PRILOSEC) 20 MG capsule Take 20 mg by mouth every evening.    Marland Kitchen oxyCODONE (OXY IR/ROXICODONE) 5 MG immediate release tablet Take 1 tablet (5 mg total) by mouth every 4 (four) hours as needed for severe pain. 30 tablet 0  . potassium chloride SA (K-DUR,KLOR-CON) 20 MEQ tablet Take 1 tablet (20 mEq total) by mouth 2 (two) times daily. 20 tablet 0  . predniSONE (DELTASONE) 10 MG tablet Take 10 mg by mouth daily with breakfast.    . prochlorperazine (COMPAZINE) 10 MG tablet Take 10 mg by mouth every 6 (six) hours as needed for nausea or vomiting.     No current facility-administered medications for this visit.    PHYSICAL EXAMINATION: ECOG PERFORMANCE STATUS: 3 - Symptomatic, >50% confined to bed  Filed Vitals:   11/01/14 1342  BP: 97/70  Pulse: 86  Temp: 98.1 F (36.7 C)  Resp: 18   Filed Weights   11/01/14 1342  Weight: 138 lb 3.2 oz (62.687 kg)    GENERAL:alert, no distress and comfortable. She looks thin and debilitated SKIN: skin color, texture, turgor are normal, no rashes or significant lesions EYES: normal, Conjunctiva are pink and non-injected, sclera clear OROPHARYNX:no exudate, no erythema and lips, buccal mucosa, and tongue normal  Musculoskeletal:no cyanosis of digits and no clubbing   NEURO: alert & oriented x 3 with fluent speech, no focal motor/sensory deficits  LABORATORY DATA:  I have reviewed the data as listed    Component Value Date/Time   NA 135 10/29/2014 1444   K 3.2* 10/29/2014 1444   CL 101 10/29/2014 1444   CO2 25 10/29/2014 1444   GLUCOSE 120* 10/29/2014 1444   BUN 9 10/29/2014 1444   CREATININE 0.78 10/29/2014 1444   CALCIUM 9.3 10/29/2014 1444   PROT 6.8 10/29/2014 1444   ALBUMIN 2.3* 10/29/2014 1444   AST 9* 10/29/2014 1444   ALT 10* 10/29/2014 1444   ALKPHOS 162* 10/29/2014 1444   BILITOT 0.9 10/29/2014 1444   GFRNONAA >60 10/29/2014 1444   GFRAA >60 10/29/2014 1444    No results found for: SPEP, UPEP  Lab Results  Component Value Date   WBC 17.1* 10/29/2014   NEUTROABS 14.8* 10/29/2014   HGB 8.9* 10/29/2014   HCT 29.2* 10/29/2014   MCV 91.5 10/29/2014   PLT 370 10/29/2014      Chemistry      Component Value Date/Time   NA 135 10/29/2014 1444   K 3.2* 10/29/2014 1444   CL 101 10/29/2014 1444  CO2 25 10/29/2014 1444   BUN 9 10/29/2014 1444   CREATININE 0.78 10/29/2014 1444      Component Value Date/Time   CALCIUM 9.3 10/29/2014 1444   ALKPHOS 162* 10/29/2014 1444   AST 9* 10/29/2014 1444   ALT 10* 10/29/2014 1444   BILITOT 0.9 10/29/2014 1444       RADIOGRAPHIC STUDIES: I reviewed the imaging study with her and her son I have personally reviewed the radiological images as listed and agreed with the findings in the report.   ASSESSMENT & PLAN:  Primary cancer of left upper lobe of lung Clinically, the patient appears to be very debilitated with progressive decline in performance status. I do not believe she would be a candidate for concurrent chemoradiation therapy. Even though she has been off chemotherapy treatment for almost 2 months, the patient was recently hospitalized for sepsis. I recommend palliative radiation treatment only. Frankly, I recommend referral to hospice care due to significant needs at home  and poor performance status. The patient and her son agree with the plan of care. I have tentatively make her an appointment to see her back in 2 months for further follow-up but if she continues to decline clinically, she has the option of canceling that appointment in the future.  Chronic pain syndrome She has chronic pain syndrome and oxycodone is not touching her pain. I will increase her pain regimen to IR morphine & I recommended hospice to follow and manage pain from home. She agreed with the plan of care.  COPD GOLD II  She had poor lung function with chronic COPD. I recommend hospice referral and oxygen at home.  Cardiac cachexia  I recommend Remeron for cancer cachexia.     No orders of the defined types were placed in this encounter.   All questions were answered. The patient knows to call the clinic with any problems, questions or concerns. No barriers to learning was detected. I spent 30 minutes counseling the patient face to face. The total time spent in the appointment was 40 minutes and more than 50% was on counseling and review of test results     [facility], [place], MD 11/01/2014 2:24 PM

## 2014-11-03 ENCOUNTER — Ambulatory Visit (HOSPITAL_COMMUNITY)
Admission: RE | Admit: 2014-11-03 | Discharge: 2014-11-03 | Disposition: A | Discharge status: Home / Self Care | Payer: PPO | Source: Ambulatory Visit | Attending: Radiation Oncology | Admitting: Radiation Oncology

## 2014-11-03 ENCOUNTER — Encounter: Payer: Self-pay | Admitting: Radiation Oncology

## 2014-11-03 ENCOUNTER — Ambulatory Visit
Admission: RE | Admit: 2014-11-03 | Discharge: 2014-11-03 | Disposition: A | Discharge status: Home / Self Care | Payer: PPO | Source: Ambulatory Visit | Attending: Radiation Oncology | Admitting: Radiation Oncology

## 2014-11-03 VITALS — BP 136/64 | HR 129 | Temp 97.9°F | Resp 34 | Wt 138.5 lb

## 2014-11-03 DIAGNOSIS — C3412 Malignant neoplasm of upper lobe, left bronchus or lung: Secondary | ICD-10-CM

## 2014-11-03 DIAGNOSIS — Z9221 Personal history of antineoplastic chemotherapy: Secondary | ICD-10-CM | POA: Diagnosis not present

## 2014-11-03 DIAGNOSIS — G894 Chronic pain syndrome: Secondary | ICD-10-CM

## 2014-11-03 DIAGNOSIS — Z510 Encounter for antineoplastic radiation therapy: Secondary | ICD-10-CM | POA: Diagnosis not present

## 2014-11-03 DIAGNOSIS — R109 Unspecified abdominal pain: Secondary | ICD-10-CM | POA: Insufficient documentation

## 2014-11-03 HISTORY — DX: Allergy, unspecified, initial encounter: T78.40XA

## 2014-11-03 HISTORY — DX: Sarcoidosis, unspecified: D86.9

## 2014-11-03 HISTORY — DX: Malignant neoplasm of unspecified part of unspecified bronchus or lung: C34.90

## 2014-11-03 HISTORY — DX: Spondylosis, unspecified: M47.9

## 2014-11-03 LAB — CULTURE, BLOOD (ROUTINE X 2)
Culture: NO GROWTH
Culture: NO GROWTH

## 2014-11-03 NOTE — Progress Notes (Signed)
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 female who presented with left upper lobe lung cancer earlier this year. Her diagnosis of squamous cell carcinoma was made on April 15, 2014 through transbronchial biopsy here at [facility]. She was treated with induction chemotherapy and was treated with 4 cycles of chemotherapy first with carbotaxol and then carbogemzar. This was completed June of this year. Her chemotherapy was complicated by multiple hospitalizations and a decrease in her performance status. Her staging studies in March showed a 6.6 x 5.5 cm mass in the left upper lobe as well as a hypermetabolic pre-vascular lymph node on the left. No evidence of metastatic disease was noted. The plan for her was neoadjuvant chemo with surgical resection. A CT of the chest on July 4 showed the left upper lung mass measuring 5.3 x 4.3 cm. She was then hospitalized for urosepsis and a CT of the chest on 10/27/14 showed a central left upper lobe mass measuring 9.1 x 6 cm. She had been off chemo for almost 2 months at that time. She was seen by Dr. Alvy Bimler for consideration of concurrent chemoradiation. Dr. Alvy Bimler did not feel she was a good candidate for concurrent chemoradiation. Instead, Dr. Alvy Bimler recommended palliative radiation and hospice. Her son who is with her states that hospice has come to the house. She has pain issues and as well as a decrease in appetite. She spends most of her time sitting on her couch or lying in bed. The pain medicine has been prescribed by Dr. Alvy Bimler and has been switched to morphine and has  been referred to hospice. She was referred to me for palliative radiation. Her most imaging available here is a CT of the chest, abdomen, and pelvis on 10/27/14 which shows an increase in size in the left upper lung mass now measuring 9.1 cm with narrowing of the left upper lung bronchus. No metastatic disease was noted.  The pt's son is present at this encounter. The pt reports abdominal pain that is always present. Pt reports intermittent chest pain. The pt reports being fatigued, but denies depression. Pt reports having regular bowel movements. She appears very somnolent, but her son states that this is a new condition for her in which she was alert and oriented yesterday.  PREVIOUS RADIATION THERAPY: No  Past medical, social and family history were reviewed in the electronic chart. Review of symptoms was reviewed in the electronic chart. Medications were reviewed in the electronic chart.  PHYSICAL EXAM:  Filed Vitals:   11/03/14 1231  BP: 136/64  Pulse: 129  Temp: 97.9 F (36.6 C)  Resp: 34  .138 lb 8 oz (62.823 kg). Nasal cannula in place with 2L of oxygen. Pt has a distended abdomen. Somnolent, but arousable. She is alert to person and place.  IMPRESSION: Stage III Non-small cell lung cancer, status post-induction chemotherapy now with poor performance status  PLAN: I have recommended palliative radiation to the left upper lung mass to assist in pain control. I explained the process of CT simulation and the placement of tattoos. I expect her to have 10 radiotherapy treatments as an outpatient  which she should tolerate well with minimal esophagitis. I agree with hospice referral and encourage the family to reach out to hospice. She signed a formed consent and will be able to CT sim today. We will start her treatments next week. I have also stressed the importance of nutrition and fluids to the patient. I will also order an X-ray of the pt's abdomen to see if there is an obstruction.  KUB was  negative for acute process.  I spent 40 minutes face to face with the patient and more than 50% of that time was spent in counseling and/or coordination of care.   This document serves as a record of services personally performed by Thea Silversmith, MD. It was created on her behalf by Darcus Austin, a trained medical scribe. The creation of this record is based on the scribe's personal observations and the provider's statements to them. This document has been checked and approved by the attending provider.   ------------------------------------------------  Thea Silversmith, MD

## 2014-11-03 NOTE — Progress Notes (Signed)
Thoracic Location of Tumor / Histology: Left Upper Lobe Lung   Patient presented  months ago with symptoms of:   Biopsies of  (if applicable) revealed: Diagnosis 04/15/2014: Lung, transbronchial biopsy, LUL- INVASIVE SQUAMOUS CELL CARCINOMA, SEE COMMENT. Microscopic Comment The carcinoma demonstrates the following immunophenotype:TTF-1 - negative expression.Cytokeratin 5/6- strong diffuse expression.  Tobacco/Marijuana/Snuff/ETOH use: Cigarettes 2 ppd x 25 years,quit 09/03/1996, no alcohol no drug use  Past/Anticipated interventions by cardiothoracic surgery, if any:at [facility]choscopy and Mediastinoscopy with lymph node biopsy Dr. Ferdinand Cava, MD  Past/Anticipated interventions by medical oncology, if any: chemotherapy treatment  07/02/14-08/27/14  (3 cycles chemotherapy [facility] combination gemcitabine and carboplatin and Taxol, Taxol stopped to severe reaction), ; Dr. Alvy Bimler note= 11/01/14 referral Hospice care home oxygen, palliative radiation  Signs/Symptoms  Weight changes, if NTZ:GYFVCBSW weight 140.0 lbs  Respiratory complaints, if HQP:RFFMBWGYK of breath even at rest.  Hemoptysis, if any: No  Pain issues, if any: Denies now but has h/o chronic pelvic pain.  SAFETY ISSUES:  Prior radiation? NO  Pacemaker/ICD? NO  Possible current pregnancy? NO  Is the patient on methotrexate?  NO  Current Complaints / other details: Widowed, 4 children, HX sarcoidosis 05/10/14,  Admitted to hospital Sepsis 10/21/2014 secondary to UTI, Father prostate cancer, Sister=throat cancer  Allergies:: Ibuprofen +Rash, Paclitaxel= Anaphylaxis (with 1st 4 minute of infusion)  Pathology - General / Other3/17/2016  [place]  Component Name Value Range  Case Report Surgical Pathology                                Case: [id number]                                Authorizing Provider:  Ferdinand Cava, MD    Collected:           06/10/2014 9390             Ordering Location:      [facility]    Received:            06/10/2014 1140                                    [facility]                                                                      Pathologist:           Dallie Dad, MD  Specimens:   A) - Lung, Left Lower Lobe, Orifice, Endobronchial Biopsy                                          B) - Lymph Node, Level 7                                                                           C) - Lymph Node, Level 4L                                                                          D) - Lymph Node, Level 4L #2                                                            DIAGNOSIS A. Left lower lobe orifice, biopsy:  Bronchial wall tissue with mixed acute and chronic inflammation.  No evidence of granulomatous inflammation or malignancy.   B. Level 7 lymph node, biopsy:  One lymph node, no evidence of granulomatous inflammation or malignancy (0/1).   C. 4L lymph node, biopsy:  Five lymph nodes with non-necrotizing granulomatous inflammation, no evidence of malignancy (0/5). See comment  Comment: Small non-necrotizing granulomata are present throughout the lymph node. Considerations would include infection, drug reaction, reaction to malignancy located elsewhere, and sarcoidosis. Stains for fungi (GMS) and acid fast organisms (AFB) are negative. Correlation with clinical history and culture data is required.   D. 4L #2 lymph node, biopsy:  Two lymph nodes, no evidence of granulomatous inflammation or malignancy (0/2).   Clinical Information Lung cancer. Per [facility]: 69 year old female who presents with a large biopsy proven tumor of the LUL. Undergone invasive mediastinal staging.    Gross Examination A. "Left lower lobe orifice", received fresh and placed in formalin on 06/10/14 at 10 am is a  0.3 x 0.3 x 0.1 cm aggregate of tan soft tissue, submitted entirely in A1.   B. "Level 7 lymph node", received fresh and placed in formalin on 06/10/14 at 10 am is a single 0.4 x 0.2 x 0.1 cm tan soft tissue fragment submitted entirely in B1.   C. "Level 4L lymph node", received fresh and placed in formalin on 06/10/14 at 10 am are four fragments of lymphoid tissue ranging from 0.4 x 0.4 x 0.4 cm to 1.1 x 1 x 0.9 cm.   Block Summary: C1- three lymph nodes  C2- one fragment bisected  D. "Level 4L #2", received fresh and placed in formalin on 06/10/14 at 10 am are two tan fragments of lymphoid tissue ranging from 0.3 x 0.3 x 0.2 cm to 0.4 x 0.4 x 0.2 cm.  Both are submitted in D1.    P. Vovos/Dr. Parrilla-Castellar

## 2014-11-03 NOTE — Progress Notes (Signed)
Name: [name]   MRN: [id number]  Date:  11/03/2014  DOB: Aug 24, 1945  Status: Outpatient    DIAGNOSIS: Locally advanced lung cancer with chest wall pain.   CONSENT VERIFIED: yes   SET UP: Patient is setup supine   IMMOBILIZATION:  The following immobilization was used: Vac Loc and wing board.   NARRATIVE:  [name] was brought to the CT Energy manager.  Identity was confirmed.  All relevant records and images related to the planned course of therapy were reviewed.  Then, the patient was positioned in a stable reproducible clinical set-up for radiation therapy.  CT images were obtained.  An isocenter was placed. Skin markings were placed.  The CT images were loaded into the planning software where the target and avoidance structures were contoured.  The radiation prescription was entered and confirmed. The patient was discharged in stable condition and tolerated simulation well.    TREATMENT PLANNING NOTE:  Treatment planning then occurred. I have requested : MLC's, isodose plan, basic dose calculation  I have requested 3 dimensional simulation with DVH of gtv, cord and lungs.   A total of 2 complex treatment devices will be used in the form of unique MLCS  This document serves as a record of services personally performed by Thea Silversmith, MD. It was created on her behalf by Darcus Austin, a trained medical scribe. The creation of this record is based on the scribe's personal observations and the provider's statements to them. This document has been checked and approved by the attending provider.

## 2014-11-03 NOTE — Progress Notes (Signed)
Please see the Nurse Progress Note in the MD Initial Consult Encounter for this patient. 

## 2014-11-04 ENCOUNTER — Telehealth: Payer: Self-pay | Admitting: Internal Medicine

## 2014-11-04 NOTE — Telephone Encounter (Signed)
BP was 86/52 this AM.  Therapist was there three hours before and bp was normal.  Then Patient took med and then [facility] took BP and it was 86/52.  FYI Patient has dark urine with odor.  Hospice was out today for evaluation.

## 2014-11-05 ENCOUNTER — Telehealth: Payer: Self-pay | Admitting: *Deleted

## 2014-11-05 DIAGNOSIS — Z510 Encounter for antineoplastic radiation therapy: Secondary | ICD-10-CM | POA: Diagnosis not present

## 2014-11-05 NOTE — Telephone Encounter (Signed)
TC from pt's son, [place]. He had some questions related to hospice Care and his mother's overall condition. Hospice services were started yesterday and Barnett Hatter states he was asked about 'funeral arrangements'  His concern/question was he didn't really know his mother was at that point in her illness.  Advised son that these are standard questions during the hospice intake. Discussed with the son his understanding of his mother's illness and what to expect in the future. He states she will be getting some radiation to her lung tumor starting next week. Reviewed the notes from Dr. Alvy Bimler and Dr. Pablo Ledger with the son. The goal of the radiation is pain control, not curative. He does understand that she will not be receiving any chemo therapy. He agrees that she has declined quite a lot and is in her bed most of the time. She sleeps a lot and does not want to eat very much. He believes she is comfortable at this time.  Asked son if he will be able to bring his mother to the [facility] for radiation next week and he said he thought he would be able to. Advised him that if he felt he could not transport her, to call us . Also advised him to keep in touch with hospice for any changes in condition as they are available 24/7. He voiced understanding. Emotional support extended. Encouraged him to call with any concerns or questions.

## 2014-11-10 ENCOUNTER — Ambulatory Visit: Payer: PPO

## 2014-11-10 ENCOUNTER — Ambulatory Visit: Payer: PPO | Admitting: Radiation Oncology

## 2014-11-10 ENCOUNTER — Ambulatory Visit
Admission: RE | Admit: 2014-11-10 | Discharge: 2014-11-10 | Disposition: A | Discharge status: Home / Self Care | Payer: PPO | Source: Ambulatory Visit | Attending: Radiation Oncology | Admitting: Radiation Oncology

## 2014-11-11 ENCOUNTER — Ambulatory Visit: Payer: PPO | Admitting: Radiation Oncology

## 2014-11-11 DIAGNOSIS — J441 Chronic obstructive pulmonary disease with (acute) exacerbation: Secondary | ICD-10-CM

## 2014-11-12 ENCOUNTER — Ambulatory Visit: Payer: PPO

## 2014-11-15 ENCOUNTER — Inpatient Hospital Stay: Payer: PPO | Admitting: Internal Medicine

## 2014-11-15 ENCOUNTER — Telehealth: Payer: Self-pay | Admitting: Internal Medicine

## 2014-11-15 ENCOUNTER — Ambulatory Visit: Payer: PPO

## 2014-11-15 NOTE — Telephone Encounter (Signed)
Reschedule pt pls

## 2014-11-15 NOTE — Telephone Encounter (Signed)
Patient no showed for hospital fu 8/22.  Please advise.

## 2014-11-16 ENCOUNTER — Ambulatory Visit: Payer: PPO

## 2014-11-17 ENCOUNTER — Ambulatory Visit: Admission: RE | Admit: 2014-11-17 | Payer: PPO | Source: Ambulatory Visit

## 2014-11-18 ENCOUNTER — Ambulatory Visit: Payer: PPO

## 2014-11-19 ENCOUNTER — Ambulatory Visit: Payer: PPO

## 2014-11-22 ENCOUNTER — Ambulatory Visit: Payer: PPO

## 2014-11-22 NOTE — Telephone Encounter (Signed)
Noted  

## 2014-11-22 NOTE — Telephone Encounter (Signed)
Patient passed on 8/20

## 2014-11-23 ENCOUNTER — Ambulatory Visit: Payer: PPO

## 2014-11-24 ENCOUNTER — Ambulatory Visit: Payer: PPO

## 2014-11-25 ENCOUNTER — Ambulatory Visit: Payer: PPO

## 2014-11-25 DEATH — deceased

## 2014-11-26 ENCOUNTER — Ambulatory Visit: Payer: PPO | Admitting: Radiation Oncology

## 2015-01-03 ENCOUNTER — Ambulatory Visit: Payer: PPO | Admitting: Hematology and Oncology

## 2015-09-11 IMAGING — CT CT ABD-PELV W/O CM
1 of 2 series · 12 of 29 positions shown, 15 images · non-contrast
Comparison: Most recent chest CT from 09/27/2014. PET-CT from
01/28/2014. CT abdomen/pelvis from 12/12/2013.

CLINICAL DATA: 68-year-old female inpatient with left upper lobe
squamous cell lung cancer diagnosed in March 2014 status post
chemotherapy, presenting with weakness, anorexia, fever. Prior
hysterectomy.

EXAM:
CT CHEST, ABDOMEN AND PELVIS WITHOUT CONTRAST
TECHNIQUE: Multidetector CT imaging of the chest, abdomen and pelvis was
performed following the standard protocol without IV contrast.

[Series 2: cap w/o w/o st · axial · non-contrast · 0.77mm/px · z∈[-588,-63]mm · 12 of 119 slices shown, 15 images]
[im 7/119  mediastinal]
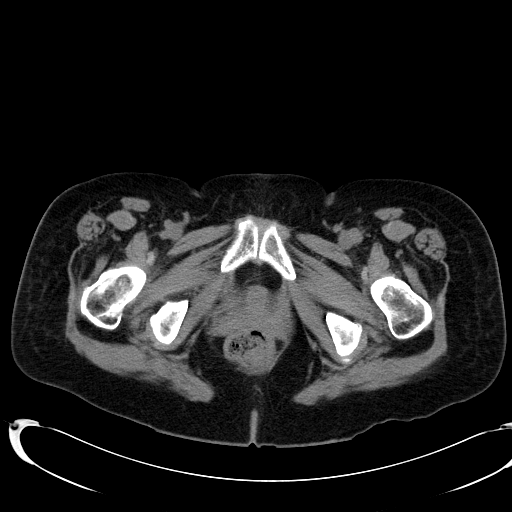
[im 7/119  lung]
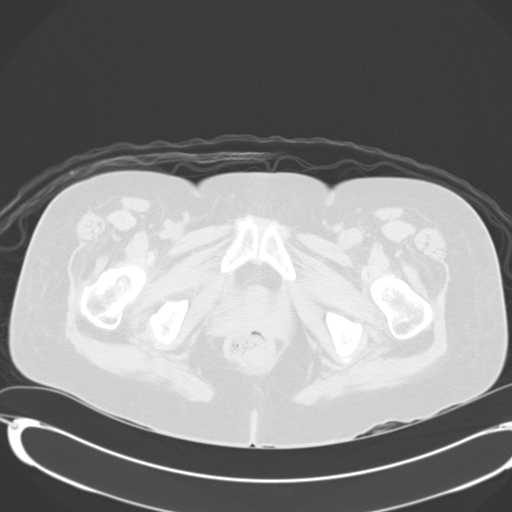
[im 20/119  lung]
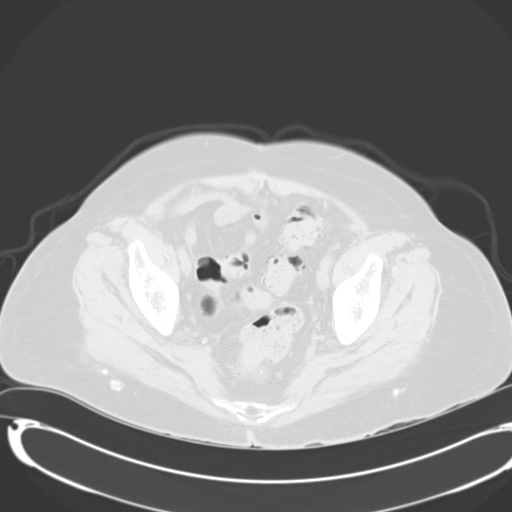
[im 27/119  lung]
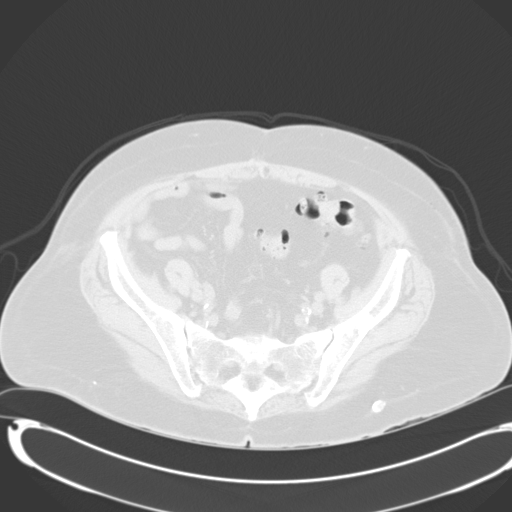
[im 40/119  lung]
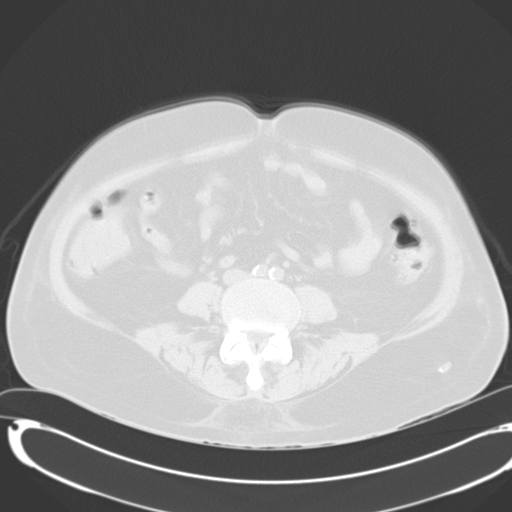
[im 46/119  mediastinal]
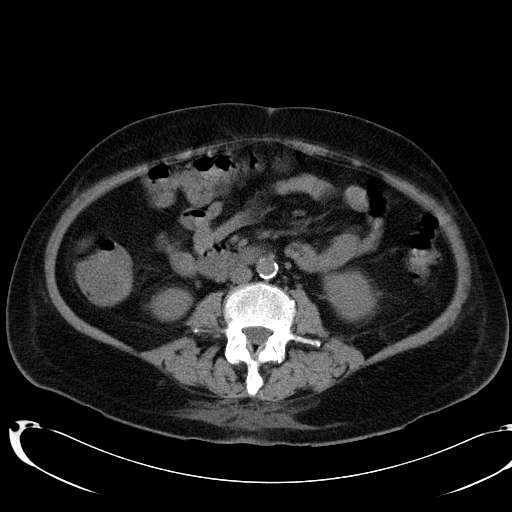
[im 46/119  lung]
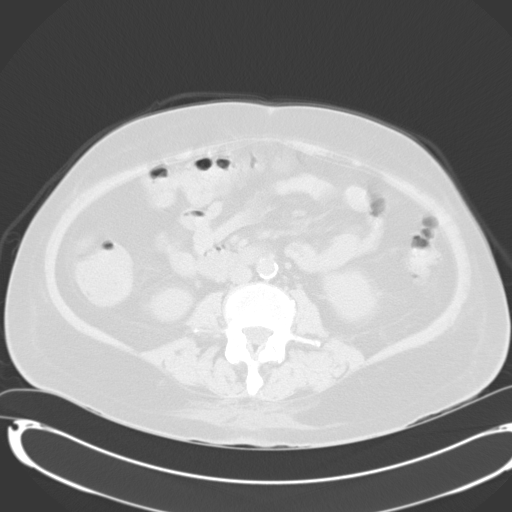
[im 58/119  lung]
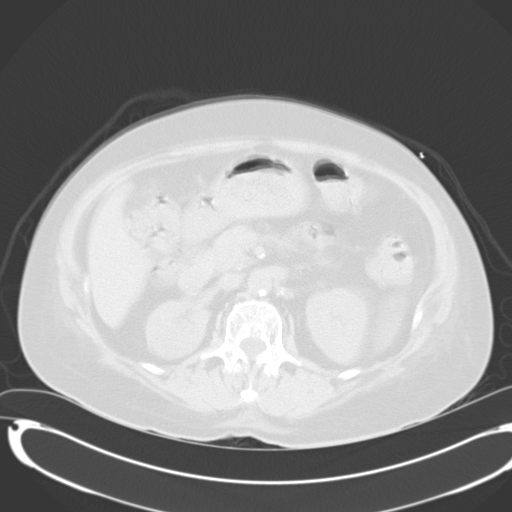
[im 60/119  lung]
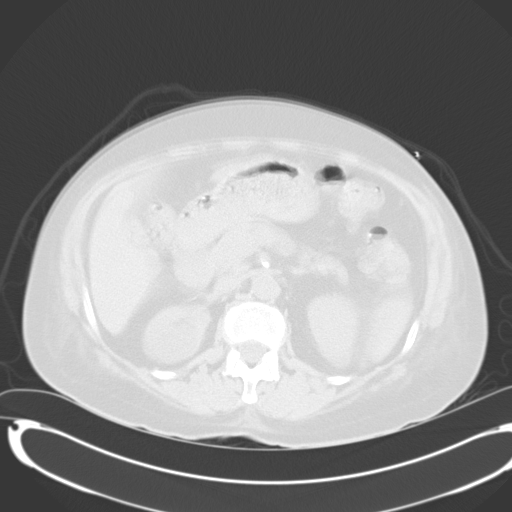
[im 73/119  lung]
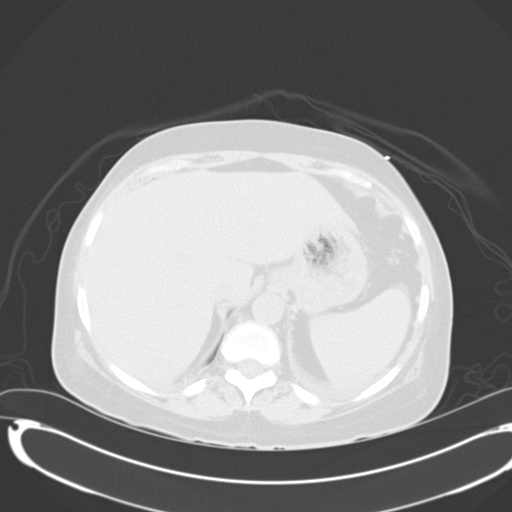
[im 79/119  mediastinal]
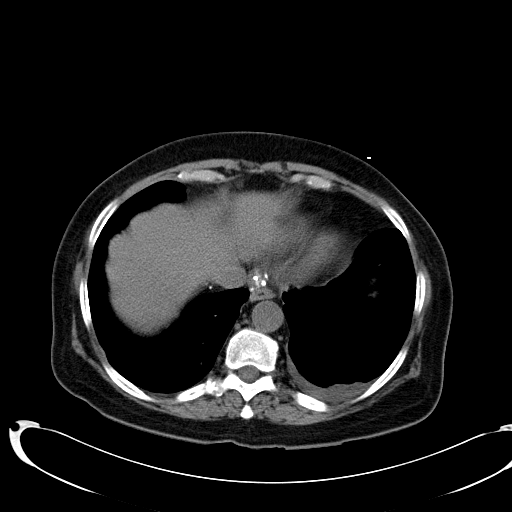
[im 79/119  lung]
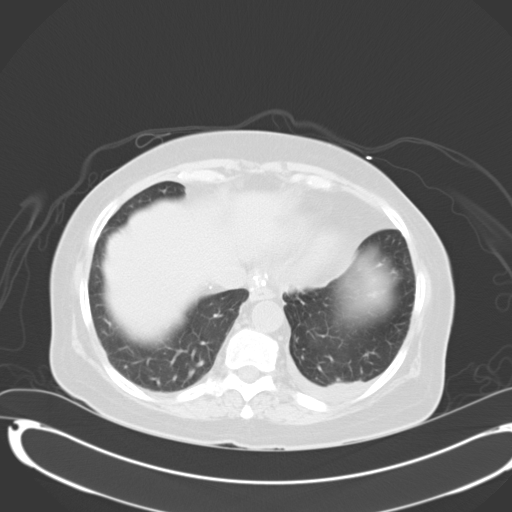
[im 92/119  lung]
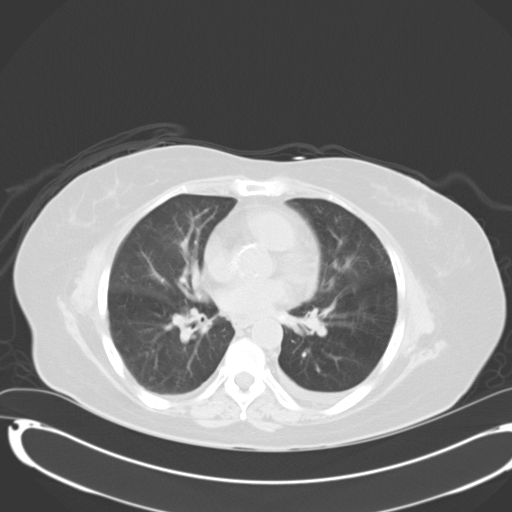
[im 99/119  lung]
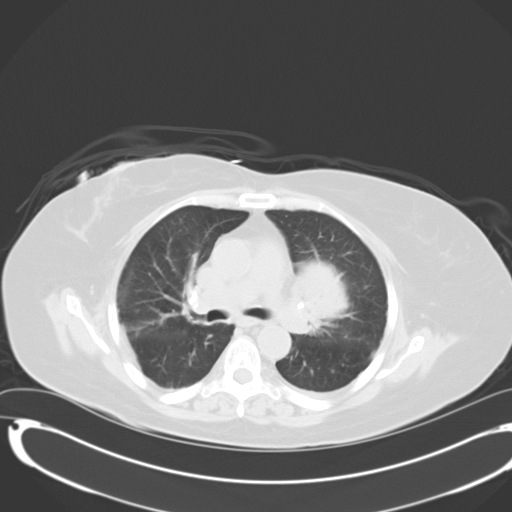
[im 112/119  lung]
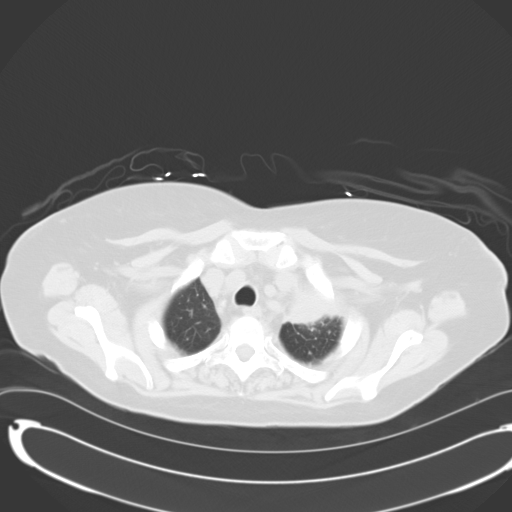

[12 of 29 positions shown; findings below may reference images not displayed]

FINDINGS: CT CHEST FINDINGS

Images are motion degraded.

Mediastinum/Nodes: Normal heart size. No pericardial
fluid/thickening. There is atherosclerosis of the thoracic aorta,
the great vessels of the mediastinum and the coronary arteries,
including calcified atherosclerotic plaque in the left anterior
descending and left circumflex coronary arteries. Stable aortic
annular calcification. Great vessels are normal in course and
caliber. Normal visualized thyroid. Normal esophagus. No axillary
lymphadenopathy. Numerous coarsely calcified nonenlarged mediastinal
and bilateral hilar lymph nodes are unchanged, and are in keeping
with prior granulomatous disease. No new pathologically enlarged
mediastinal nodes. No gross hilar lymphadenopathy, noting limited
sensitivity for the detection of hilar adenopathy on a noncontrast
study.

Lungs/Pleura: No pneumothorax. New trace right and small left
layering pleural effusions. Medial left upper lobe primary
malignancy measures 9.1 x 6.0 cm (series 2/image 16), significantly
increased from 5.3 x 4.3 cm on 09/27/2014, again demonstrating a
broad base of pleural attachment to the mediastinal and
anterior/apical left pleural surface, with extension of the tumor to
the upper left hilum and associated worsening extrinsic narrowing of
the associated left upper lobe segmental bronchus. There is
increased mild patchy ground-glass opacity in the apical left upper
lobe surrounding the malignant tumor. Right upper lobe 4 mm
noncalcified pulmonary nodule ([DATE]) is unchanged since 06/19/2010.
Nodular scarring in the anterior basilar right lower lobe is
unchanged since 06/19/2010. Subpleural 6 mm right lower lobe
pulmonary nodule ([DATE]) is unchanged since 06/19/2010. Subpleural
nodularity in the superior left lower lobe is unchanged since
06/19/2010. Multiple subcentimeter calcified granulomas are again
noted of both lungs. No new significant pulmonary nodules.

Musculoskeletal: Mild degenerative changes in the thoracic spine. No
suspicious focal osseous lesions in the chest.

CT ABDOMEN AND PELVIS FINDINGS

Hepatobiliary: Normal liver, with no liver mass. Contracted and
grossly normal gallbladder, with no radiopaque cholelithiasis. No
biliary ductal dilatation.

Pancreas: Normal.

Spleen: Normal size spleen (craniocaudal splenic length 9.8 cm). No
splenic mass.

Adrenals/Urinary Tract: Normal adrenals. Simple 4.1 cm renal cyst in
the lateral upper right kidney. Otherwise normal kidneys, with no
contour deforming renal mass, no hydronephrosis and no
nephrolithiasis. Normal caliber ureters. Minimally distended and
grossly normal urinary bladder.

Stomach/Bowel: Grossly normal stomach. Normal caliber small bowel.
The appendix is not discretely visualized. There is severe sigmoid
diverticulosis, with scattered diverticula throughout the remaining
colon. No colonic wall thickening or pericolonic fat stranding to
suggest acute diverticulitis.

Vascular/Lymphatic: Atherosclerotic nonaneurysmal abdominal aorta.
No new lymphadenopathy in the abdomen or pelvis. Stable coarsely
calcified portacaval nodes from prior granulomatous disease.

Reproductive: Status post hysterectomy, with no abnormal findings at
the vaginal cuff. No adnexal abnormality.

Other: No pneumoperitoneum, ascites or focal fluid collection.

Musculoskeletal: Stable chronic small bilateral fat containing
inguinal hernias, left greater than right. Coarsely calcified
bilateral gluteal subcutaneous granulomas. Nonspecific sclerotic
lesion in the left iliac bone, unchanged since 12/12/2013. Mild
degenerative changes in the lumbar spine. No new focal osseous
lesions in the abdomen or pelvis.
IMPRESSION: 1. Interval progression of the medial left upper lobe primary
malignancy, now 9.1 x 6.0 cm, with worsening extrinsic narrowing of
the associated left upper lobe segmental bronchus.
2. Increased nonspecific mild patchy ground-glass opacity in the
left upper lobe surrounding the neoplasm, which could represent mild
postobstructive pneumonitis, peritumoral hemorrhage or small
satellite tumor nodules.
3. New trace right and small left layering pleural effusions.
4. No evidence of metastatic disease in the chest, abdomen or pelvis
on this noncontrast study.
5. Marked sigmoid diverticulosis, with no evidence of acute
diverticulitis. No acute abnormality in the abdomen or pelvis.
Several additional chronic findings as above.

## 2015-09-18 IMAGING — CR DG ABDOMEN ACUTE W/ 1V CHEST
2 series · 2 of 2 positions shown · non-contrast
Comparison: CT, 10/27/2014

CLINICAL DATA: Patient is having abdominal pain and distension of
spinal and left upper lobe lung carcinoma.

EXAM:
DG ABDOMEN ACUTE W/ 1V CHEST

[view not recorded (1 of 2)]
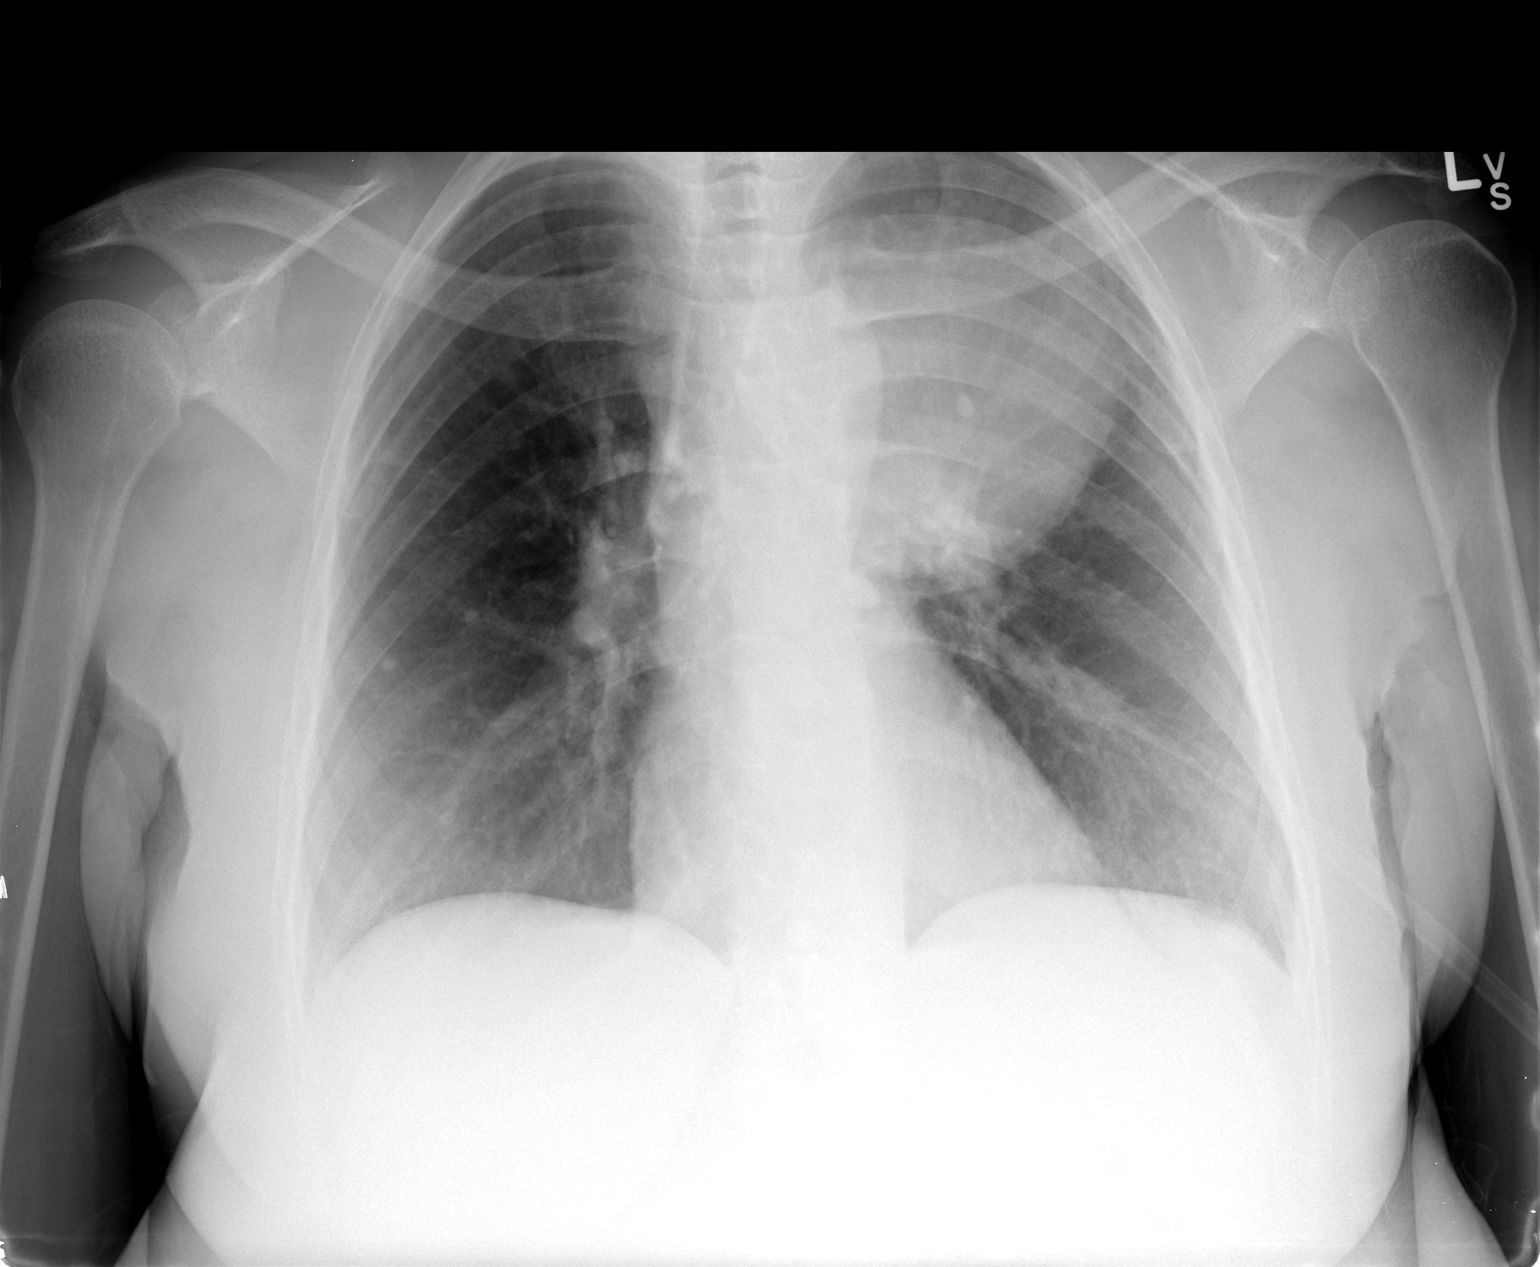

[view not recorded (2 of 2)]
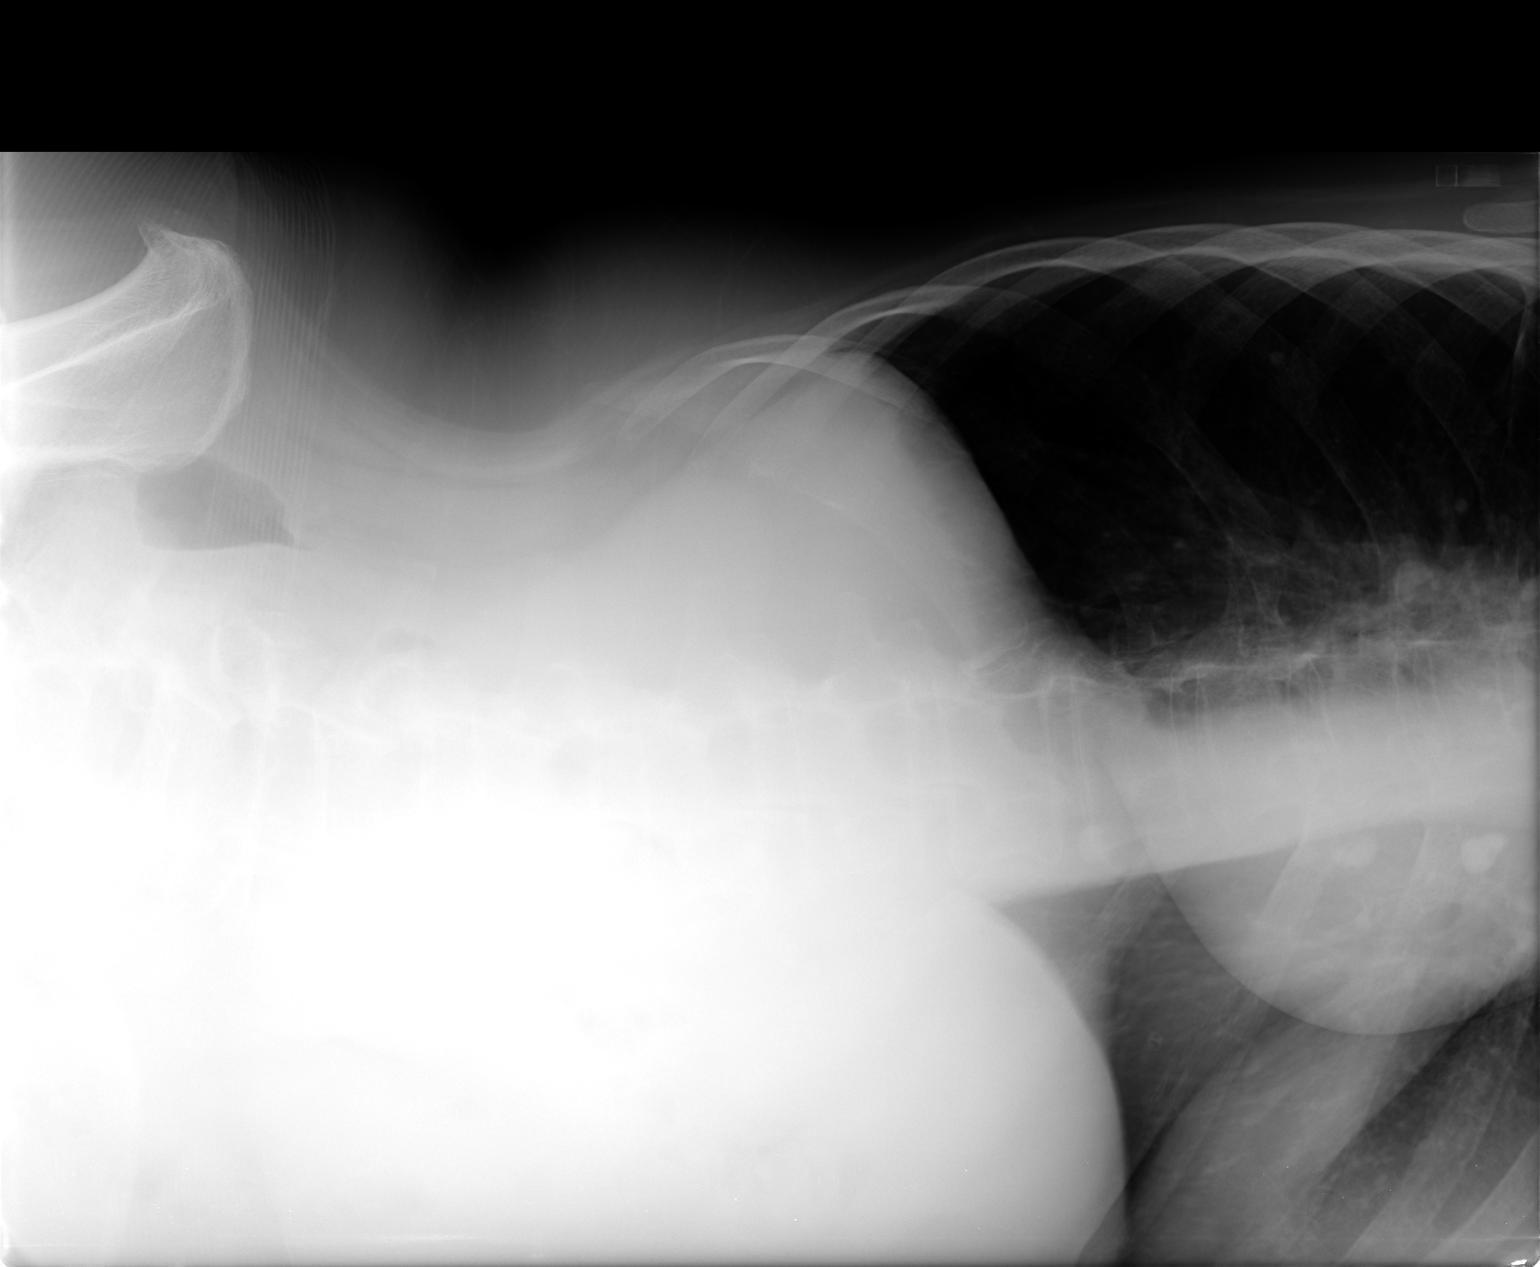

[2 of 2 positions shown; findings below may reference images not displayed]

FINDINGS: Normal bowel gas pattern. No evidence of renal or ureteral stones.
Name scattered soft tissue calcifications due to injection
granuloma. There is calcified nodes along the porta hepatis based on
the prior CT.

No evidence of renal or ureteral stones. No organomegaly. Soft
tissue planes are well-defined arguing against ascites.

No free air.

Large left upper lobe mass is unchanged from the most recent prior
chest radiograph. No lung consolidation or edema. No pleural
effusion or pneumothorax.
IMPRESSION: No acute findings in the abdomen or pelvis. No evidence of bowel
obstruction, generalized adynamic ileus or free air.

Stable left upper lobe mass.  No acute findings in the chest.
